# Patient Record
Sex: Female | Born: 1965 | Race: Black or African American | Hispanic: No
Health system: Midwestern US, Community
[De-identification: ages and names within clinical notes are randomized; demographics above are authoritative.]

## PROBLEM LIST (undated history)

## (undated) DIAGNOSIS — I10 Essential (primary) hypertension: Secondary | ICD-10-CM

## (undated) DIAGNOSIS — E119 Type 2 diabetes mellitus without complications: Secondary | ICD-10-CM

## (undated) HISTORY — PX: KNEE SURGERY: SHX244

---

## 2016-09-18 NOTE — Nursing Note (Signed)
Nursing Discharge Summary - Text       Nursing Discharge Summary Entered On:  09/18/2016 17:04 EST    Performed On:  09/18/2016 17:03 EST by Harlow Ohms, RN, Raphael Gibney               DC Information   Discharge To, Anticipated :   Home independently   Mode of Discharge :   Ambulatory   Transportation :   Private vehicle   [place], [place] - 09/18/2016 17:03 EST   Education   Responsible Learner(s) :   No Data Available     Barriers To Learning :   None evident   Teaching Method :   Explanation, Printed materials, Teach-back   MOOBERRY, RN, Raphael Gibney - 09/18/2016 17:03 EST   Medication Education Adult Grid   Med Dosage, Route, Scheduling :   TEFL teacher understanding   Med Generic/Brand Name, Purpose, Action :   Verbalizes understanding   Medication Precautions :   IT sales professional, Medication :   Verbalizes understanding   MOOBERRY, RN, Raphael Gibney - 09/18/2016 17:03 EST

## 2016-09-18 NOTE — Nursing Note (Signed)
Orthostatics - Text       Orthostatics Entered On:  09/18/2016 15:15 EST    Performed On:  09/18/2016 15:11 EST by Mady Haagensen               Orthostatics   Systolic/Diastolic  Supine BP :   169 mmHg (HI)    Systolic/Diastolic  Supine BP :   92 mmHg (HI)    Pulse Supine :   69 bpm   Systolic/Diastolic  Standing BP :   153 mmHg (HI)    Systolic/Diastolic  Standing BP :   109 mmHg (>HHI)    Pulse Standing :   93 bpm   Comments :   patient states feeling light headed and dizzy while standing.    Mady Haagensen - 09/18/2016 15:11 EST

## 2016-09-19 NOTE — Nursing Note (Signed)
Orthostatics - Text       Orthostatics Entered On:  09/19/2016 14:27 EST    Performed On:  09/19/2016 14:21 EST by ROUSSEAU,  CHRISTA B               Orthostatics   Systolic/Diastolic  Supine BP :   132 mmHg   Systolic/Diastolic  Supine BP :   84 mmHg   Pulse Supine :   67 bpm   Systolic/Diastolic  Standing BP :   126 mmHg   Systolic/Diastolic  Standing BP :   89 mmHg   Pulse Standing :   72 bpm   Systolic/Diastolic  Sitting BP :   154 mmHg (HI)    Systolic/Diastolic  Sitting BP :   94 mmHg (HI)    Pulse Sitting :   70 bpm   ROUSSEAU,  CHRISTA B - 09/19/2016 14:21 EST

## 2016-09-19 NOTE — Nursing Note (Signed)
Nursing Discharge Summary - Text       Nursing Discharge Summary Entered On:  09/19/2016 19:43 EST    Performed On:  09/19/2016 19:42 EST by Mechele CollinELLIOTT, RN, JESSICA A               DC Information   Discharge To, Anticipated :   Home independently   Mode of Discharge :   Ambulatory   Transportation :   Other: escorted to waiting room, informed pt she needs to wit 3 hrs before we can call ride for her. pt verbalized understanding   ELLIOTT, RN, JESSICA A - 09/19/2016 19:42 EST   Education   Responsible Learner(s) :   No Data Available     Teaching Method :   Explanation, Printed materials   ELLIOTT, RN, JESSICA A - 09/19/2016 19:42 EST   Post-Hospital Education Adult Grid   Pain Management :   Verbalizes understanding   Plan of Care :   Verbalizes understanding   When to Call Health Care Provider :   [facility]   ELLIOTT, RN, JESSICA A - 09/19/2016 19:42 EST   Medication Education Adult Grid   Med Dosage, Route, Scheduling :   Verbalizes understanding   Medication Precautions :   Verbalizes understanding   ELLIOTT, RN, JESSICA A - 09/19/2016 19:42 EST

## 2016-09-29 NOTE — Case Communication (Signed)
CM Discharge Planning Assessment - Text       CM Discharge Planning Assessment Entered On:  09/29/2016 10:45 EST    Performed On:  09/29/2016 10:44 EST by Malone,  [place]   Living Environment :   No Living Environment Information Available     Affect/Behavior :   Appropriate, Calm, Cooperative   Lives With :   [place],  [place] - 09/29/2016 10:44 EST   Discharge Needs I   Previously Documented Discharge Needs :   DISCHARGE PLAN/NEEDS:  EQUIPMENT/TREATMENT NEEDS:       Previously Documented Benefits Information :   No discharge data available.     Home Caregiver Name/Relationship :   Simona Huh - ex-friend - [phone]   CM Progress Note :   09/29/16 - CDM - MSW met w/ pt bedside. She called 911 b/c people were sticking her with needles. She could feel it on her body. She denies visual hallucinations and says no one lives w/ her. She does admit to auditory hallucinations 'people talking.' Pt recently moved to [place] from [place] in the last month. She proceeded to show MSW an email she drafted which was addressed to 'bpdnews.' In summary, it explained how she needed help b/c mean people were brought to her home by a friend and they were involved in drugs and speaking through the stereo. She never sent it. Pt says she has been 'stuck with needles' for a/b 2 wks now. After review of her chart, pt came to ED over a week ago for the same complaint. She denies SI/SA/HI. She says she was diagnosed with depression a 'while ago.' She said she saw a counselor in a hospital back in [place]. MSW asked if she had seen a counselor since being in [place], and she responded, 'no, why should I?' Pt listed a former friend, Simona Huh, [place] as her e-contact and gave MSW permission to speak with him. Simona Huh reports he has noticed a difference in her for a while. He explains it that she would say people were after her, and she became nervous and scared to where she never left her house. He  said he didn't even know she moved to [place] until one day she called him w/ and [phone] number. MSW asked if there could be a possibility that people were after her in [place], and he said, 'maybe, b/c there are some mean people around here.' But he denied that people could follow her to [place]. He said he thinks pt was hospitalized for 'a while' d/t MH related issues. MSW Sbar to Dr. Hessie Knows and Dr. Hessie Knows to see pt. Medicaid listed as pt's insurance.     09/29/16 - CDM - Pt lacks insight, impaired judgement, & paranoia - No family/friends in town. No safe d/c plan. Involuntary committment. Referrals sent to [place], [place], [place] and [place].      Dondra Prader [facility] - 09/29/2016 10:44 EST   Discharge Needs II   Discharge Planning Time Spent :   20 minutes   Dondra Prader [facility] - 09/29/2016 10:44 EST

## 2016-09-29 NOTE — Case Communication (Signed)
CM Discharge Planning Assessment - Text       CM Discharge Planning Assessment Entered On:  09/29/2016 9:28 EST    Performed On:  09/29/2016 9:20 EST by Malone,  [place]   Living Environment :   No Living Environment Information Available     Affect/Behavior :   Appropriate, Calm, Cooperative   Lives With :   [place],  [place] - 09/29/2016 9:20 EST   Discharge Needs I   Previously Documented Discharge Needs :   DISCHARGE PLAN/NEEDS:No discharge data available.  EQUIPMENT/TREATMENT NEEDS:No discharge data available.     Previously Documented Benefits Information :   No discharge data available.     Home Caregiver Name/Relationship :   Simona Huh - ex-friend - [phone]   CM Progress Note :   09/29/16 - CDM - MSW met w/ pt bedside. She called 911 b/c people were sticking her with needles. She could feel it on her body. She denies visual hallucinations and says no one lives w/ her. She does admit to auditory hallucinations 'people talking.' Pt recently moved to [place] from [place] in the last month. She proceeded to show MSW an email she drafted which was addressed to 'bpdnews.' In summary, it explained how she needed help b/c mean people were brought to her home by a friend and they were involved in drugs and speaking through the stereo. She never sent it. Pt says she has been 'stuck with needles' for a/b 2 wks now. After review of her chart, pt came to ED over a week ago for the same complaint. She denies SI/SA/HI. She says she was diagnosed with depression a 'while ago.' She said she saw a counselor in a hospital back in [place]. MSW asked if she had seen a counselor since being in [place], and she responded, 'no, why should I?' Pt listed a former friend, Simona Huh, [place] as her e-contact and gave MSW permission to speak with him. Simona Huh reports he has noticed a difference in her for a while. He explains it that she would say people were after her, and she became nervous  and scared to where she never left her house. He said he didn't even know she moved to [place] until one day she called him w/ and [phone] number. MSW asked if there could be a possibility that people were after her in [place], and he said, 'maybe, b/c there are some mean people around here.' But he denied that people could follow her to [place]. He said he thinks pt was hospitalized for 'a while' d/t MH related issues. MSW Sbar to Dr. Hessie Knows and Dr. Hessie Knows to see pt. Medicaid listed as pt's insurance.      Dondra Prader [facility] - 09/29/2016 9:20 EST   Discharge Needs II   Discharge Planning Time Spent :   30 minutes   Dondra Prader [facility] - 09/29/2016 9:20 EST

## 2016-09-29 NOTE — Nursing Note (Signed)
Nursing Discharge Summary - Text       Nursing Discharge Summary Entered On:  09/29/2016 13:33 EST    Performed On:  09/29/2016 13:33 EST by Harlow OhmsMOOBERRY, RN, Raphael GibneyJOHN S               DC Information   Discharge To, Anticipated :   Psychiatric Unit   Mode of Discharge :   Ambulatory   Transportation :   Other: police transport   Kelli ChurnMOOBERRY, RN, JOHN S - 09/29/2016 13:33 EST

## 2018-05-10 LAB — COMPREHENSIVE METABOLIC PANEL
ALT: 9 U/L (ref 0–33)
AST: 13 U/L (ref 0–32)
Albumin/Globulin Ratio: 1.74 mmol/L (ref 1.00–2.00)
Albumin: 4 g/dL (ref 3.5–5.2)
Alk Phosphatase: 84 U/L (ref 35–117)
Anion Gap: 12 mmol/L (ref 2–17)
BUN: 10 mg/dL (ref 6–20)
CO2: 26 mmol/L (ref 22–29)
Calcium: 8.9 mg/dL (ref 8.6–10.0)
Chloride: 103 mmol/L (ref 98–107)
Creatinine: 0.8 mg/dL (ref 0.5–0.9)
GFR African American: >60
GFR Non-African American: >60
Globulin: 2.3 g/dL (ref 1.9–4.4)
Glucose: 140 mg/dL — ABNORMAL HIGH (ref 70–99)
Potassium: 4 mmol/L (ref 3.5–5.3)
Sodium: 141 mmol/L (ref 135–145)
Total Bilirubin: 0.5 mg/dL (ref 0.00–1.20)
Total Protein: 6.3 g/dL — ABNORMAL LOW (ref 6.4–8.3)

## 2018-05-10 LAB — URINALYSIS WITH MICROSCOPIC
Bilirubin Urine: NEGATIVE
Blood, Urine: NEGATIVE
Glucose, UA: NEGATIVE mg/dL
Ketones, Urine: NEGATIVE mg/dL
Leukocyte Esterase, Urine: NEGATIVE
MUCUS, URINE: NONE SEEN /LPF
Nitrite, Urine: NEGATIVE
Protein, UA: NEGATIVE
Specific Gravity, UA: <=1.005 (ref 1.003–1.035)
Squam Epithel, UA: NONE SEEN /LPF
Urobilinogen, Urine: 0.2 EU/dL
pH, UA: 6.5 (ref 4.5–8.0)

## 2018-05-10 LAB — URINE DRUG SCREEN
Amphetamine Screen, Urine: NEGATIVE
Barbiturate Screen, Ur: NEGATIVE
Benzodiazepine Screen, Urine: NEGATIVE
Cannabinoid Scrn, Ur: NEGATIVE
Cocaine Screen Urine: NEGATIVE
Opiate Screen, Urine: NEGATIVE
PCP Screen, Urine: NEGATIVE

## 2018-05-10 LAB — CBC
Hematocrit: 33.8 % — ABNORMAL LOW (ref 34.0–47.0)
Hemoglobin: 11 g/dL — ABNORMAL LOW (ref 11.5–15.7)
MCH: 29.9 pg (ref 27.0–34.5)
MCHC: 32.7 g/dL (ref 32.0–36.0)
MCV: 91.5 fL (ref 81.0–99.0)
MPV: 9.2 fL (ref 7.2–11.2)
Platelets: 183 10*3/uL (ref 140–440)
RBC: 3.69 x10e6/mcL (ref 3.60–5.20)
RDW: 15.5 % (ref 11.0–16.0)
WBC: 4.3 10*3/uL (ref 3.8–10.6)

## 2018-05-10 LAB — ETHANOL: Ethanol Lvl: NOT DETECTED mg/dL (ref 0.0–10.0)

## 2018-05-10 LAB — ACETAMINOPHEN LEVEL: Acetaminophen Level: 10 ug/mL (ref 10.0–30.0)

## 2018-05-10 LAB — SALICYLATE LEVEL: Salicylate Lvl: <0.3 mg/dL — ABNORMAL LOW (ref 3.0–20.0)

## 2018-05-10 NOTE — Case Communication (Signed)
CM Discharge Planning Assessment - Text       CM Discharge Planning Ongoing Assessment Entered On:  05/10/2018 18:10 EDT    Performed On:  05/10/2018 18:07 EDT by Clint BolderMelton,  [place]               Discharge Needs I   Previously Documented Discharge Needs :   DISCHARGE PLAN/NEEDS:No discharge data available.  EQUIPMENT/TREATMENT NEEDS:No discharge data available.     Previously Documented Benefits Information :   No discharge data available.     CM Progress Note :   **See previous case managment note for 05/10/18 prior to pt elopment**  05/10/18 KRM - Review of records indicates previous hx of paranoia, delusions and possible A/V hallucinations. Pt continues to deny names, numbers of supports for hospital to contact. Due to concern of pt's welfare, SWCM to file report w/ APS.      Clint BolderMelton,  [place] - 05/10/2018 18:07 EDT

## 2018-05-10 NOTE — Case Communication (Signed)
 CM Discharge Planning Assessment - Text       CM Discharge Planning Assessment Entered On:  05/10/2018 12:46 EDT    Performed On:  05/10/2018 12:27 EDT by Agapito Iha               Home Environment   Living Environment :   No Living Environment Information Available     Affect/Behavior :   Appropriate, Calm, Other: Guarded   Lives With :   Alone   Lives In :   Apartment   Living Situation :   Home independently   Agapito Iha - 05/10/2018 12:27 EDT   Home Environment   ADLs Prior to Admission :   Independent   Sensory Deficits :   None   Agapito Iha - 05/10/2018 12:27 EDT   Discharge Needs I   Previously Documented Discharge Needs :   DISCHARGE PLAN/NEEDS:No discharge data available.  EQUIPMENT/TREATMENT NEEDS:No discharge data available.     Previously Documented Benefits Information :   No discharge data available.     CM Progress Note :   05/10/18 KRM - SWCM brought in for consult for concerns of possible psych needs after pt contacted police more than once last night w/ reports of people following her, seeing blood on her ceiling. Pt is a 53 year old female who was brought in via EMS for c/o weakness in her legs. She was previously in the hospital less than 24 hours prior for concerns of a psychiatric nature, but pt was able to verbalize an understanding that what she had reported to the police was not real and stated it was a prank being played by her friends/neighbors. SWCM met with pt at bedside. Pt was calm and cooperative during consult. She was dressed appopriately, well-groomed and appeared stated age. Pt was aox3. Pt denied any thoughts of SI/HI. Pt stated that what she saw and reported to the police was not real and that it was her friends playing a prank on her. Pt stated her friends called her and told her to call the police bc someone was bleeding her bedroom and another person was holding a knife. Per pt, police did not find anything or anyone in her bedroom. When asked how her friends  could know that someone was in her bedroom, pt stated it was bc two of the guys were in my room hiding. Pt denied knowing her friend's contact number. SWCM asked on which phone her friend's contacted her and pt stated it was her cell phone. SWCM asked if the friend's number showed on caller id, and pt stated I don't know, I didn't look. Pt proceeded to look through her cell phone, and said, it's not here. SWCM discussed w/ pt possible natural and social supports. Pt stated she has three adult children who live in the [place] area and but none in the [place] area. Per pt she has lived here around 4 years. When asked if any of her children knew she was in the ED currently or last night, she said no. When asked if she wanted the hosptial to reach to anyone regarding being in the ED or if she is admitted, was there anyone whom pt wanted notified, she said no. During consult and while answering questions, pt appeared logical and verbalized understanding that what she saw/reported to the police, was not real. Pt did appear guarded, affect blunted. She denied any past hx of mental illness, including a/v hallucinations or paranoia. Pt made  eye contact w/ SWCM during consult when answering questions; however, pt appeared to focus on door when asked if there was anyone to contact. SWCM provided sbar to ED MD and will remain available for support, services as needed.      Agapito Iha - 05/10/2018 12:27 EDT   Discharge Needs II   Discharge Planning Time Spent :   30 minutes   Agapito Iha - 05/10/2018 12:27 EDT   Advance Directive   Advance Directive :   No   Agapito Iha - 05/10/2018 12:27 EDT   Discharge Planning   Discharge Arrangements :       Patient Post-Acute Information    Patient Name: Kaitlin Porter, Kaitlin Porter  MRN: [id number]  FIN: [id number]  Gender: Female  DOB: April 06, 2066  Age:  52 Years        *** No Post-Acute Placement(s) Listed ***          *** No Post-Acute Service(s) Listed ***       Agapito Iha - 05/10/2018 12:27 EDT   Discharge Planning Assessment Status   Discharge Planning Assessment Complete :   Yes   Agapito Iha - 05/10/2018 12:27 EDT

## 2019-01-04 LAB — CBC
Hematocrit: 35.8 % — ABNORMAL LOW (ref 38.0–47.0)
Hemoglobin: 11.3 g/dL — ABNORMAL LOW (ref 11.5–15.7)
MCH: 28.2 pg (ref 27.0–34.5)
MCHC: 31.6 g/dL — ABNORMAL LOW (ref 32.0–36.0)
MCV: 89.3 fL (ref 81.0–99.0)
MPV: 10.9 fL (ref 7.2–13.2)
NRBC Absolute: 0 10*3/uL
NRBC Automated: 0 %
Platelets: 202 10*3/uL (ref 140–440)
RBC: 4.01 x10e6/mcL (ref 3.60–5.20)
RDW: 14.8 % (ref 11.0–16.0)
WBC: 6.6 10*3/uL (ref 3.8–10.6)

## 2019-01-04 LAB — BASIC METABOLIC PANEL
Anion Gap: 16 mmol/L (ref 2–17)
BUN: 14 mg/dL (ref 6–20)
CO2: 22 mmol/L (ref 22–29)
Calcium: 9.3 mg/dL (ref 8.6–10.0)
Chloride: 99 mmol/L (ref 98–107)
Creatinine: 0.8 mg/dL (ref 0.5–0.9)
GFR African American: 98 mL/min/{1.73_m2} (ref >=90)
GFR Non-African American: 85 mL/min/{1.73_m2} — ABNORMAL LOW (ref >=90)
Glucose: 94 mg/dL (ref 70–99)
OSMOLALITY CALCULATED: 274 mOsm/kg (ref 270–287)
Potassium: 4 mmol/L (ref 3.5–5.3)
Sodium: 137 mmol/L (ref 135–145)

## 2019-01-04 LAB — HEPATIC FUNCTION PANEL
ALT: 6 U/L (ref 0–33)
AST: 17 U/L (ref 0–32)
Albumin: 4.6 g/dL (ref 3.5–5.2)
Alk Phosphatase: 73 U/L (ref 35–117)
Bilirubin, Direct: <0.2 mg/dL (ref 0.00–0.30)
Total Bilirubin: 0.7 mg/dL (ref 0.00–1.20)
Total Protein: 7.6 g/dL (ref 6.4–8.3)

## 2019-01-04 LAB — LIPASE: Lipase: 6 U/L — ABNORMAL LOW (ref 13–60)

## 2019-01-04 NOTE — ED Provider Notes (Signed)
Abdominal Pain *ED        Patient:   Kaitlin Porter, Kaitlin Porter             MRN: [id number]            FIN: [id number]               Age:   53 years     Sex:  Female     DOB:  [birth year]   Associated Diagnoses:   Abdominal gas pain; Constipation   Author:   Charlyne Mom      Basic Information   Time seen: Provider Seen (ST)   ED Provider/Time:    Charlyne Mom / 01/04/2019 18:19  .   Additional information: Chief Complaint from Nursing Triage Note   Chief Complaint  Chief Complaint: Pt c/o lower abd pain radiating to back x2 weeks. pt also reports not having a BM for "weeks". pt was seen at [facility] today for same. (01/04/19 18:18:00).      History of Present Illness   Kaitlin Porter is a 53 year old African American female who presents via EMS with abdominal pain.  Pt. was seen at [facility] [place] earlier today for the same complaint.  Pt received a full workup there with bloodwork and a CT scan and was determined to have no bowel obstruction but was diagnosed with constipation.  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  Pt presents c/o sharp pains in the abdomen since taking the Miralax that she was prescribed earlier today.  States she has not had a bowel movement. States the cramping in her abdomen is very painful.       Review of Systems   Constitutional symptoms:  Negative except as documented in HPI.   Skin symptoms:  Negative except as documented in HPI.   ENMT symptoms:  Negative except as documented in HPI.   Respiratory symptoms:  Negative except as documented in HPI.   Cardiovascular symptoms:  Negative except as documented in HPI.   Gastrointestinal symptoms:  Abdominal pain, diffuse, cramping, constipation.    Genitourinary symptoms:  Negative except as documented in HPI.   Neurologic symptoms:  Negative  except as documented in HPI.             Additional review of systems information: All other systems reviewed and otherwise negative.      Health Status   Allergies:    Allergic Reactions (All)  Mild  Lipitor- No reactions were documented.  Canceled/Inactive Reactions (All)  No Known Allergies.   Medications:  (Selected)   Inpatient Medications  Ordered  simethicone: 40 mg, 0.6 mL, Oral, Once  Prescriptions  Prescribed  Bentyl 20 mg oral tablet: 20 mg, 1 tabs, Oral, QID, for 5 days, PRN: mild pain (1-3), 20 tabs, 0 Refill(s)  MiraLax oral powder for reconstitution: 17 g, Oral, Daily, for 14 days, 238 g, 0 Refill(s)  PriLOSEC 20 mg oral delayed release capsule: 20 mg, 1 caps, Oral, Daily, 30 caps, 0 Refill(s)  meloxicam 7.5 mg oral tablet: 7.5 mg, 1 tabs, Oral, Daily, pain, PRN: other (see comment), 30 tabs, 0 Refill(s)  ondansetron 4 mg oral tablet: 4 mg, 1 tabs, Oral, q6hr, for 3 days, PRN: as needed for nausea/vomiting, 20 tabs,  0 Refill(s)  Documented Medications  Documented  HYDROcodone-acetaminophen 5-325 EmergiScript: 0 Refill(s)  Tylenol Extra Strength 500 mg oral tablet: mg, tabs, Oral, q6hr, 0 Refill(s)  atenolol 100 mg oral tablet: 100 mg, 1 tabs, Oral, Daily, 30 tabs, 0 Refill(s)  cyclobenzaprine 10 mg oral tablet: 10 mg, 1 tabs, Oral, TID, PRN, 30 tabs, 0 Refill(s)  fluticasone 50 mcg/inh nasal spray: 1 sprays, Nasal, BID, 16 g, 0 Refill(s)  levothyroxine 75 mcg (0.075 mg) oral tablet: 75 mcg, 1 tabs, Oral, Daily, 30 tabs, 0 Refill(s)  lisinopril 40 mg oral tablet: 40 mg, 1 tabs, Oral, Daily, 30 tabs, 0 Refill(s)  meloxicam 7.5 mg oral tablet: 7.5 mg, 1 tabs, Oral, Daily, 30 tabs, 0 Refill(s)  oxybutynin 5 mg/24 hours oral tablet, extended release: 5 mg, 1 tabs, Oral, Daily, 30 tabs, 0 Refill(s)  simvastatin 20 mg oral tablet: 20 mg, 1 tabs, Oral, Once a Day (at bedtime), 0 Refill(s).      Past Medical/ Family/ Social History   Problem list:    Active Problems (3)  HTN   Hypercholesteremia   Hypothyroid    .      Physical Examination               Vital Signs   Vital Signs   01/04/2019 18:24 EDT Respiratory Rate 16 br/min   01/04/2019 18:18 EDT Systolic Blood Pressure 139 mmHg    Diastolic Blood Pressure 92 mmHg  HI    Temperature Oral 36.2 degC    Heart Rate Monitored 77 bpm    Respiratory Rate 17 br/min    SpO2 100 %   01/04/2019 12:05 EDT Temperature Oral 36.5 degC   01/04/2019 11:40 EDT Systolic Blood Pressure 175 mmHg  HI    Diastolic Blood Pressure 95 mmHg  HI    Heart Rate Monitored 80 bpm    Respiratory Rate 16 br/min    SpO2 100 %   01/04/2019 11:12 EDT Systolic Blood Pressure 161 mmHg  HI    Diastolic Blood Pressure 96 mmHg  HI    Heart Rate Monitored 88 bpm    Respiratory Rate 16 br/min    SpO2 99 %   01/04/2019 10:25 EDT Systolic Blood Pressure 176 mmHg  HI    Diastolic Blood Pressure 109 mmHg  >HHI    Temperature Oral 36.9 degC    Heart Rate Monitored 95 bpm    Respiratory Rate 16 br/min    SpO2 99 %    Measurements   01/04/2019 18:21 EDT Body Mass Index est meas 35.96 kg/m2    Body Mass Index Measured 35.96 kg/m2   01/04/2019 18:18 EDT Height/Length Measured 167.6 cm    Weight Dosing 101 kg   01/04/2019 10:30 EDT Body Mass Index est meas 31.33 kg/m2    Body Mass Index Measured 31.33 kg/m2   01/04/2019 10:25 EDT Height/Length Measured 167.6 cm    Weight Dosing 88 kg    General:  Alert, no acute distress.    Skin:  Warm.   Head:  Normocephalic.   Eye:  Pupils are equal, round and reactive to light.   Ears, nose, mouth and throat:  Oral mucosa moist.   Cardiovascular:  Regular rate and rhythm.   Respiratory:  Lungs are clear to auscultation.   Gastrointestinal:  Soft, Non distended, Tenderness: Moderate, generalized, Guarding: Negative, Rebound: Negative, Bowel sounds: Hyperactive.    Back:  Nontender, Normal range of motion.    Musculoskeletal:  Normal ROM.   Neurological:  Alert and  oriented to person, place, time, and situation, No focal neurological deficit observed.    Psychiatric:  Cooperative.      Medical Decision  Making   Differential Diagnosis:  Abdominal pain, constipation.    Documents reviewed:  Emergency department nurses' notes, flowsheet, emergency department records, prior records, vital signs, [facility].  Please refer to those for pt's workup including CT scan..    Notes:  pt seemed angry when I asked her what else I could do for her after [facility].  She asked me if I could do a CT scan of her abdomen but she just received one earlier today approximately 6 hours ago.   I explained this to her and told her that her bowel sounds are hyperactive likely giving her gas pains from the Miralax and that I can offer her something to calm down the gas pains but there is nothing else diagnostic that they did not do earlier.  She agreed to try some simethicone..      Reexamination/ Reevaluation   Time: 01/04/2019 19:40:00 .   Course: improving.   Pain status: decreased.      Impression and Plan   Diagnosis   Abdominal gas pain (ICD10-CM R14.1, Discharge, Medical)   Constipation (ICD10-CM K59.00, Discharge, Medical)   Plan   Condition: Stable.    Disposition: Discharged: to home.    Patient was given the following educational materials: Intestinal Gas and Gas Pains, Adult ([id number]).    Follow up with: Be sure to follow up as directed from your [facility] visit earlier with both your primary care physician and Neurosurgery. Within 1 week You can take some Gas-x over the counter for gas pains.  Follow up with Dr. Christoper FabianMcNulty if your constipation does not resolve over the weekend with the Miralax. ; SAMUEL MCNULTY-MD, View Only Providers - No Access Within 1 week.    Counseled: Patient.    Signature Line     Electronically Signed on 01/04/2019 07:40 PM EDT   ________________________________________________   Charlyne MomKING-MD,  Katheline Brendlinger ELAINE               Modified by: Charlyne MomKING-MD,  Tanaisha Pittman ELAINE on 01/04/2019 07:01 PM EDT      Modified by: Charlyne MomKING-MD,  Irem Stoneham ELAINE on 01/04/2019 07:40  PM EDT

## 2019-01-04 NOTE — Nursing Note (Signed)
Medication Administration Follow Up-Text       Medication Administration Follow Up Entered On:  01/04/2019 11:38 EDT    Performed On:  01/04/2019 11:38 EDT by Cliffton Asters, RN, [place]      Intervention Information:     ketorolac  Performed by Cliffton Asters, RN, [place] on 01/04/2019 11:03:00 EDT       ketorolac,15mg   IV Push,Antecubital, Right       Med Response   ED Medication Response :   No adverse reaction, Symptoms improved   Numeric Rating Pain Scale :   7   [place] Opioid Induced Sedation Scale :   1 = Awake and alert   White, RN, [place] - 01/04/2019 11:38 EDT

## 2019-01-04 NOTE — ED Notes (Signed)
 ED Triage Note       ED Triage Adult Entered On:  01/04/2019 18:21 EDT    Performed On:  01/04/2019 18:18 EDT by Dannial, RN, Jaclynne A               Triage   Chief Complaint :   Pt c/o lower abd pain radiating to back x2 weeks. pt also reports not having a BM for weeks. pt was seen at [facility] today for same.    Numeric Rating Pain Scale :   8   [place] Mode of Arrival :   Ambulance   Infectious Disease Documentation :   Document assessment   Temperature Oral :   36.2 degC(Converted to: 97.2 degF)    Heart Rate Monitored :   77 bpm   Respiratory Rate :   17 br/min   Systolic Blood Pressure :   139 mmHg   Diastolic Blood Pressure :   92 mmHg (HI)    SpO2 :   100 %   Oxygen Therapy :   Room air   Patient presentation :   None of the above   Chief Complaint or Presentation suggest infection :   No   Dosing Weight Obtained By :   Patient stated   Weight Dosing :   101 kg(Converted to: 222 lb 11 oz)    Height :   167.6 cm(Converted to: 5 ft 6 in)    Body Mass Index Dosing :   36 kg/m2   Dannial, RN, [place] A - 01/04/2019 18:18 EDT   DCP GENERIC CODE   Tracking Acuity :   3   Tracking Group :   ED [address] Tracking Group   [place], RN, [place] A - 01/04/2019 18:18 EDT   ED General Section :   Document assessment   Pregnancy Status :   Patient denies   ED Allergies Section :   Document assessment   ED Reason for Visit Section :   Document assessment   Dannial, RN, [place]ch LABOR - 01/04/2019 18:18 EDT   ID Risk Screen Symptoms   Recent Travel History :   No recent travel   Close Contact with COVID-19  ID :   No   Have you been tested for COVID-19 ID :   No   TB Symptom Screen :   No symptoms   C. diff Symptom/History ID :   Neither of the above   Dannial, RN, Jaclynne A - 01/04/2019 18:18 EDT   Allergies   (As Of: 01/04/2019 18:21:10 EDT)   Allergies (Active)   Lipitor  Estimated Onset Date:   Unspecified ; Created ByBETHA FLOR, RN, LARA J; Reaction Status:   Active ; Category:   Drug ; Substance:   Lipitor ; Type:   Allergy ; Severity:    Mild ; Updated By:   FLOR RN, KARILYN PARAS; Reviewed Date:   01/04/2019 18:19 EDT        Psycho-Social   Last 3 mo, thoughts killing self/others :   Patient denies   Dannial, Programme researcher, broadcasting/film/video - 01/04/2019 18:18 EDT   ED Reason for Visit   (As Of: 01/04/2019 18:21:10 EDT)   Problems(Active)    HTN (SNOMED CT  :AZEGxwEmLzUcjRiawKgAAg )  Name of Problem:   HTN ; Recorder:   CABE, RN, ADAM Z; Confirmation:   Confirmed ; Classification:   Medical ; Code:   AZEGxwEmLzUcjRiawKgAAg ; Contributor System:   PowerChart ; Last Updated:   09/18/2016  13:23 EST ; Life Cycle Date:   09/18/2016 ; Life Cycle Status:   Active ; Vocabulary:   SNOMED CT        Hypercholesteremia (IMO  :53324 )  Name of Problem:   Hypercholesteremia ; Recorder:   CABE, RN, ADAM Z; Confirmation:   Confirmed ; Classification:   Medical ; Code:   53324 ; Contributor System:   PowerChart ; Last Updated:   09/18/2016 13:23 EST ; Life Cycle Date:   09/18/2016 ; Life Cycle Status:   Active ; Vocabulary:   IMO        Hypothyroid (IMO  :B9796911 )  Name of Problem:   Hypothyroid ; Recorder:   MALONE, RN, JENNIFER L; Confirmation:   Confirmed ; Classification:   Medical ; Code:   [id number] ; Contributor System:   PowerChart ; Last Updated:   09/19/2016 14:07 EST ; Life Cycle Date:   09/19/2016 ; Life Cycle Status:   Active ; Vocabulary:   IMO          Diagnoses(Active)    Abdominal pain  Date:   01/04/2019 ; Diagnosis Type:   Reason For Visit ; Confirmation:   Complaint of ; Clinical Dx:   Abdominal pain ; Classification:   Medical ; Clinical Service:   Non-Specified ; Code:   PNED ; Probability:   0 ; Diagnosis Code:   [id number]

## 2019-01-04 NOTE — ED Notes (Signed)
 ED Triage Note       ED Triage Adult Entered On:  01/04/2019 10:30 EDT    Performed On:  01/04/2019 10:25 EDT by Teresa, RN, Crystal               Triage   Chief Complaint :   i feel bloated i took colace yesterday without relief last bowel movement over a week ago   Numeric Rating Pain Scale :   7   [place] Mode of Arrival :   Private vehicle   Infectious Disease Documentation :   Document assessment   Temperature Oral :   36.9 degC(Converted to: 98.4 degF)    Heart Rate Monitored :   95 bpm   Respiratory Rate :   16 br/min   SpO2 :   99 %   Oxygen Therapy :   Room air   Patient presentation :   None of the above   Chief Complaint or Presentation suggest infection :   No   Dosing Weight Obtained By :   Measured   Weight Dosing :   88 kg(Converted to: 194 lb 0 oz)    Height :   167.6 cm(Converted to: 5 ft 6 in)    Body Mass Index Dosing :   31 kg/m2   White, RN, Crystal - 01/04/2019 10:25 EDT   DCP GENERIC CODE   Tracking Acuity :   3   Tracking Group :   ED [employer], RN, Crystal - 01/04/2019 10:25 EDT   ED General Section :   Document assessment   Pregnancy Status :   Patient denies   ED Allergies Section :   Document assessment   ED Reason for Visit Section :   Document assessment   ED Home Meds Section :   Document assessment   ED Quick Assessment :   Patient appears awake, alert, oriented to baseline. Skin warm and dry. Moves all extremities. Respiration even and unlabored. Appears in no apparent distress.   White, RN, Crystal - 01/04/2019 10:25 EDT   ID Risk Screen Symptoms   Recent Travel History :   No recent travel   Close Contact with COVID-19  ID :   No   Have you been tested for COVID-19 ID :   No   TB Symptom Screen :   No symptoms   C. diff Symptom/History ID :   Neither of the above   White, RN, Crystal - 01/04/2019 10:25 EDT   Allergies   (As Of: 01/04/2019 10:30:57 EDT)   Allergies (Active)   Lipitor  Estimated Onset Date:   Unspecified ; Created ByBETHA FLOR, RN, LARA J; Reaction Status:    Active ; Category:   Drug ; Substance:   Lipitor ; Type:   Allergy ; Severity:   Mild ; Updated By:   FLOR RN, KARILYN PARAS; Reviewed Date:   01/04/2019 10:28 EDT        Psycho-Social   Last 3 mo, thoughts killing self/others :   Patient denies   Injuries/Abuse/Neglect in Household :   Denies   Feels Unsafe at Home :   No   White, RN, Crystal - 01/04/2019 10:25 EDT   ED Home Med List   Medication List   (As Of: 01/04/2019 10:30:57 EDT)   Prescription/Discharge Order    omeprazole  :   omeprazole ; Status:   Prescribed ; Ordered As Mnemonic:   PriLOSEC 20 mg oral  delayed release capsule ; Simple Display Line:   20 mg, 1 caps, Oral, Daily, 30 caps, 0 Refill(s) ; Ordering Provider:   PRICE-MD,  KEVIN CHARLES; Catalog Code:   omeprazole ; Order Dt/Tm:   02/19/2018 23:32:47 EDT          meloxicam  :   meloxicam ; Status:   Prescribed ; Ordered As Mnemonic:   meloxicam 7.5 mg oral tablet ; Simple Display Line:   7.5 mg, 1 tabs, Oral, Daily, pain, PRN: other (see comment), 30 tabs, 0 Refill(s) ; Ordering Provider:   CANDIDA ALM NED; Catalog Code:   meloxicam ; Order Dt/Tm:   09/18/2016 16:55:32 EST            Home Meds    acetaminophen  :   acetaminophen ; Status:   Documented ; Ordered As Mnemonic:   Tylenol Extra Strength 500 mg oral tablet ; Simple Display Line:   mg, tabs, Oral, q6hr, 0 Refill(s) ; Catalog Code:   acetaminophen ; Order Dt/Tm:   01/04/2019 10:30:40 EDT          cyclobenzaprine  :   cyclobenzaprine ; Status:   Documented ; Ordered As Mnemonic:   cyclobenzaprine 10 mg oral tablet ; Simple Display Line:   10 mg, 1 tabs, Oral, TID, PRN, 30 tabs, 0 Refill(s) ; Catalog Code:   cyclobenzaprine ; Order Dt/Tm:   09/29/2016 04:32:44 EST ; Comment:    THIS MEDICATION IS ASSOCIATED   WITH   AN INCREASED RISK OF FALLS.          atenolol  :   atenolol ; Status:   Documented ; Ordered As Mnemonic:   atenolol 100 mg oral tablet ; Simple Display Line:   100 mg, 1 tabs, Oral, Daily, 30 tabs, 0 Refill(s) ; Catalog Code:    atenolol ; Order Dt/Tm:   09/19/2016 14:08:51 EST          fluticasone nasal  :   fluticasone nasal ; Status:   Documented ; Ordered As Mnemonic:   fluticasone 50 mcg/inh nasal spray ; Simple Display Line:   1 sprays, Nasal, BID, 16 g, 0 Refill(s) ; Catalog Code:   fluticasone nasal ; Order Dt/Tm:   09/19/2016 14:08:51 EST          HYDROcodone-acetaminophen  :   HYDROcodone-acetaminophen ; Status:   Documented ; Ordered As Mnemonic:   HYDROcodone-acetaminophen 5-325 EmergiScript ; Simple Display Line:   0 Refill(s) ; Catalog Code:   HYDROcodone-acetaminophen ; Order Dt/Tm:   09/19/2016 14:08:51 EST          levothyroxine  :   levothyroxine ; Status:   Documented ; Ordered As Mnemonic:   levothyroxine 75 mcg (0.075 mg) oral tablet ; Simple Display Line:   75 mcg, 1 tabs, Oral, Daily, 30 tabs, 0 Refill(s) ; Catalog Code:   levothyroxine ; Order Dt/Tm:   09/19/2016 14:08:51 EST          lisinopril  :   lisinopril ; Status:   Documented ; Ordered As Mnemonic:   lisinopril 40 mg oral tablet ; Simple Display Line:   40 mg, 1 tabs, Oral, Daily, 30 tabs, 0 Refill(s) ; Catalog Code:   lisinopril ; Order Dt/Tm:   09/19/2016 14:08:51 EST          meloxicam  :   meloxicam ; Status:   Documented ; Ordered As Mnemonic:   meloxicam 7.5 mg oral tablet ; Simple Display Line:   7.5 mg,  1 tabs, Oral, Daily, 30 tabs, 0 Refill(s) ; Catalog Code:   meloxicam ; Order Dt/Tm:   09/19/2016 14:08:51 EST          oxybutynin  :   oxybutynin ; Status:   Documented ; Ordered As Mnemonic:   oxybutynin 5 mg/24 hours oral tablet, extended release ; Simple Display Line:   5 mg, 1 tabs, Oral, Daily, 30 tabs, 0 Refill(s) ; Catalog Code:   oxybutynin ; Order Dt/Tm:   09/19/2016 14:08:51 EST          simvastatin  :   simvastatin ; Status:   Documented ; Ordered As Mnemonic:   simvastatin 20 mg oral tablet ; Simple Display Line:   20 mg, 1 tabs, Oral, Once a Day (at bedtime), 0 Refill(s) ; Catalog Code:   simvastatin ; Order Dt/Tm:   09/19/2016 14:08:51  EST            ED Reason for Visit   (As Of: 01/04/2019 10:30:57 EDT)   Problems(Active)    HTN (SNOMED CT  :AZEGxwEmLzUcjRiawKgAAg )  Name of Problem:   HTN ; Recorder:   CABE, RN, ADAM Z; Confirmation:   Confirmed ; Classification:   Medical ; Code:   AZEGxwEmLzUcjRiawKgAAg ; Contributor System:   PowerChart ; Last Updated:   09/18/2016 13:23 EST ; Life Cycle Date:   09/18/2016 ; Life Cycle Status:   Active ; Vocabulary:   SNOMED CT        Hypercholesteremia (IMO  :53324 )  Name of Problem:   Hypercholesteremia ; Recorder:   CABE, RN, ADAM Z; Confirmation:   Confirmed ; Classification:   Medical ; Code:   53324 ; Contributor System:   PowerChart ; Last Updated:   09/18/2016 13:23 EST ; Life Cycle Date:   09/18/2016 ; Life Cycle Status:   Active ; Vocabulary:   IMO        Hypothyroid (IMO  :B9796911 )  Name of Problem:   Hypothyroid ; Recorder:   MALONE, RN, JENNIFER L; Confirmation:   Confirmed ; Classification:   Medical ; Code:   [id number] ; Contributor System:   PowerChart ; Last Updated:   09/19/2016 14:07 EST ; Life Cycle Date:   09/19/2016 ; Life Cycle Status:   Active ; Vocabulary:   IMO          Diagnoses(Active)    CN - Constipation  Date:   01/04/2019 ; Diagnosis Type:   Reason For Visit ; Confirmation:   Complaint of ; Clinical Dx:   CN - Constipation ; Classification:   Medical ; Clinical Service:   Emergency medicine ; Code:   PNED ; Probability:   0 ; Diagnosis Code:   [id number]

## 2019-01-04 NOTE — ED Notes (Signed)
ED Triage Note       ED Secondary Triage Entered On:  01/04/2019 10:36 EDT    Performed On:  01/04/2019 10:35 EDT by Cliffton Asters, RN, Crystal               General Information   Barriers to Learning :   None evident   ED Home Meds Section :   Document assessment   [facility] ED Fall Risk Section :   Document assessment   ED Advance Directives Section :   Document assessment   White, RN, Crystal - 01/04/2019 10:35 EDT   (As Of: 01/04/2019 10:36:05 EDT)   Problems(Active)    HTN (SNOMED CT  :AZEGxwEmLzUcjRiawKgAAg )  Name of Problem:   HTN ; Recorder:   CABE, RN, ADAM Z; Confirmation:   Confirmed ; Classification:   Medical ; Code:   AZEGxwEmLzUcjRiawKgAAg ; Contributor System:   Dietitian ; Last Updated:   09/18/2016 13:23 EST ; Life Cycle Date:   09/18/2016 ; Life Cycle Status:   Active ; Vocabulary:   SNOMED CT        Hypercholesteremia (IMO  :93112 )  Name of Problem:   Hypercholesteremia ; Recorder:   CABE, RN, ADAM Z; Confirmation:   Confirmed ; Classification:   Medical ; Code:   16244 ; Contributor System:   PowerChart ; Last Updated:   09/18/2016 13:23 EST ; Life Cycle Date:   09/18/2016 ; Life Cycle Status:   Active ; Vocabulary:   IMO        Hypothyroid (IMO  :[id number] )  Name of Problem:   Hypothyroid ; Recorder:   MALONE, RN, JENNIFER L; Confirmation:   Confirmed ; Classification:   Medical ; Code:   [id number] ; Contributor System:   Dietitian ; Last Updated:   09/19/2016 14:07 EST ; Life Cycle Date:   09/19/2016 ; Life Cycle Status:   Active ; Vocabulary:   IMO          Diagnoses(Active)    CN - Constipation  Date:   01/04/2019 ; Diagnosis Type:   Reason For Visit ; Confirmation:   Complaint of ; Clinical Dx:   CN - Constipation ; Classification:   Medical ; Clinical Service:   Emergency medicine ; Code:   PNED ; Probability:   0 ; Diagnosis Code:   [id number]             -    Procedure History   (As Of: 01/04/2019 10:36:05 EDT)     Anesthesia Minutes:   0 ; Procedure Name:   VP shunt ; Procedure  Minutes:   0            Anesthesia Minutes:   0 ; Procedure Name:   Cholecystectomy ; Procedure Minutes:   0            [facility] Fall Risk Assessment Tool   Hx of falling last 3 months ED Fall :   No   Patient confused or disoriented ED Fall :   No   Patient intoxicated or sedated ED Fall :   No   Patient impaired gait ED Fall :   No   Use a mobility assistance device ED Fall :   No   Patient altered elimination ED Fall :   No   [facility] ED Fall Score :   0    White, RN, Crystal - 01/04/2019 10:35 EDT   ED Advance Directive   Advance Directive :   No  White, RN, Crystal - 01/04/2019 10:35 EDT

## 2019-01-04 NOTE — Nursing Note (Signed)
Medication Administration Follow Up-Text       Medication Administration Follow Up Entered On:  01/04/2019 11:38 EDT    Performed On:  01/04/2019 11:38 EDT by Cliffton Asters, RN, Crystal      Intervention Information:     ondansetron  Performed by Cliffton Asters, RN, Crystal on 01/04/2019 11:03:00 EDT       ondansetron,4mg   IV Push,Antecubital, Right       Med Response   ED Medication Response :   No adverse reaction, Symptoms improved   Numeric Rating Pain Scale :   7   [place] Opioid Induced Sedation Scale :   1 = Awake and alert   White, RN, Crystal - 01/04/2019 11:38 EDT

## 2019-01-04 NOTE — ED Provider Notes (Signed)
Abdominal Pain *ED        Patient:   Kaitlin Porter, Kaitlin Porter             MRN: [id number]            FIN: [id number]               Age:   53 years     Sex:  Female     DOB:  Oct 31, 1965   Associated Diagnoses:   Abdominal pain; Constipation   Author:   Cathie Hoops A      Basic Information   Time seen: Provider Seen (ST)   ED Provider/Time:    Irlene Crudup-MD,  Burhanuddin Kohlmann A / 01/04/2019 10:22  .   Additional information: Chief Complaint from Nursing Triage Note   Chief Complaint  Chief Complaint: "i feel bloated" i took colace yesterday without relief last bowel movement over a week ago (01/04/19 10:25:00).      History of Present Illness   Patient complains of 3 days of right-sided abdominal pain described as pain and bloating.  She notes that she has a "hard knot" in the right abdomen.  She has been constipated has not had a bowel movement approximately 1 week.  She typically only has a bowel movement about twice per week.  She denies any recent blood in the stool.  She has had some mild rectal discomfort.  She feels nauseous but has not had any vomiting.  No history of bowel obstruction.  She is status post cholecystectomy remotely.  She has a remote history of cervical spine surgery years ago but no noted lumbar surgical history.  She has some paresthesias in the anterior bilateral thighs but denies any saddle anesthesia no urinary incontinence or fecal incontinence.  Denies fevers or chills.  She feels little short of breath sometimes but has not had a cough or chest pain.  Moderate severity.  No alleviating factors at home.  No radiation.        Review of Systems             Additional review of systems information: All other systems reviewed and otherwise negative.      Health Status   Allergies:    Allergic Reactions (All)  Mild  Lipitor- No reactions were documented.  Canceled/Inactive Reactions (All)  No Known Allergies.   Medications:  (Selected)   Inpatient Medications  Ordered  Isovue-300: 100 mL, IV Contrast, Once  Toradol:  15 mg, 1 mL, IV Push, Once  Zofran: 4 mg, 2 mL, IV Push, Once  Prescriptions  Prescribed  PriLOSEC 20 mg oral delayed release capsule: 20 mg, 1 caps, Oral, Daily, 30 caps, 0 Refill(s)  meloxicam 7.5 mg oral tablet: 7.5 mg, 1 tabs, Oral, Daily, pain, PRN: other (see comment), 30 tabs, 0 Refill(s)  Documented Medications  Documented  HYDROcodone-acetaminophen 5-325 EmergiScript: 0 Refill(s)  Tylenol Extra Strength 500 mg oral tablet: mg, tabs, Oral, q6hr, 0 Refill(s)  atenolol 100 mg oral tablet: 100 mg, 1 tabs, Oral, Daily, 30 tabs, 0 Refill(s)  cyclobenzaprine 10 mg oral tablet: 10 mg, 1 tabs, Oral, TID, PRN, 30 tabs, 0 Refill(s)  fluticasone 50 mcg/inh nasal spray: 1 sprays, Nasal, BID, 16 g, 0 Refill(s)  levothyroxine 75 mcg (0.075 mg) oral tablet: 75 mcg, 1 tabs, Oral, Daily, 30 tabs, 0 Refill(s)  lisinopril 40 mg oral tablet: 40 mg, 1 tabs, Oral, Daily, 30 tabs, 0 Refill(s)  meloxicam 7.5 mg oral tablet: 7.5 mg, 1 tabs, Oral, Daily, 30  tabs, 0 Refill(s)  oxybutynin 5 mg/24 hours oral tablet, extended release: 5 mg, 1 tabs, Oral, Daily, 30 tabs, 0 Refill(s)  simvastatin 20 mg oral tablet: 20 mg, 1 tabs, Oral, Once a Day (at bedtime), 0 Refill(s).      Past Medical/ Family/ Social History   Medical history: Reviewed as documented in chart.   Surgical history: Reviewed as documented in chart.   Family history: Not significant.   Social history: Reviewed as documented in chart.   Problem list:    Active Problems (3)  HTN   Hypercholesteremia   Hypothyroid   .      Physical Examination               Vital Signs   Vital Signs   6/0/1093 23:55 EDT Systolic Blood Pressure 732 mmHg  HI    Diastolic Blood Pressure 202 mmHg  >HHI    Temperature Oral 36.9 degC    Heart Rate Monitored 95 bpm    Respiratory Rate 16 br/min    SpO2 99 %    Measurements   01/04/2019 10:30 EDT Body Mass Index est meas 31.33 kg/m2    Body Mass Index Measured 31.33 kg/m2   01/04/2019 10:25 EDT Height/Length Measured 167.6 cm    Weight Dosing 88 kg     Basic Oxygen Information   01/04/2019 10:25 EDT SpO2 99 %    Oxygen Therapy Room air    General:  Alert, no acute distress.    Skin:  Warm, dry, pink.    Head:  Normocephalic, atraumatic.    Eye:  Extraocular movements are intact, normal conjunctiva.    Cardiovascular:  Regular rate and rhythm, No murmur, Normal peripheral perfusion.    Respiratory:  Lungs are clear to auscultation, respirations are non-labored, breath sounds are equal.    Gastrointestinal:  Soft, Non distended, Normal bowel sounds, Patient has mild diffuse abdominal tenderness more prominent in the right upper right lower quadrant.  There is a firm area measuring about 5 x 3 cm in the right lower quadrant and subcutaneous tissue.  Is not fluctuant..    Musculoskeletal:  Normal ROM, normal strength.    Neurological:  Alert and oriented to person, place, time, and situation, No focal neurological deficit observed.    Psychiatric:  Cooperative, appropriate mood & affect.       Medical Decision Making   Documents reviewed:  Emergency department nurses' notes, flowsheet, vital signs.    Results review:  Lab results : Lab View   01/04/2019 11:21 EDT Estimated Creatinine Clearance 91.89 mL/min   01/04/2019 10:57 EDT WBC 6.6 x10e3/mcL    RBC 4.01 x10e6/mcL    Hgb 11.3 g/dL  LOW    HCT 35.8 %  LOW    MCV 89.3 fL    MCH 28.2 pg    MCHC 31.6 g/dL  LOW    RDW 14.8 %    Platelet 202 x10e3/mcL    MPV 10.9 fL    NRBC Absolute Auto 0.00 x10e3/mcL  NA    NRBC Percent Auto 0.0 %  NA    Sodium Lvl 137 mmol/L    Potassium Lvl 4.0 mmol/L    Chloride 99 mmol/L    CO2 22 mmol/L    Glucose Random 94 mg/dL    BUN 14 mg/dL    Creatinine Lvl 0.8 mg/dL    AGAP 16 mmol/L    Osmolality Calc 274 mOsm/kg    Calcium Lvl 9.3 mg/dL    Protein Total 7.6  g/dL    Albumin Lvl 4.6 g/dL    Alk Phos 73 unit/L    AST 17 unit/L    ALT 6 unit/L    eGFR AA 98 mL/min/1.46m???    eGFR Non-AA 85 mL/min/1.719m??  LOW    Bili Total 0.70 mg/dL    Bili Direct <0.20 mg/dL    Lipase Lvl 6 unit/L  LOW    01/04/2019 10:30 EDT Estimated Creatinine Clearance 91.89 mL/min    Radiology results:  Rad Results (ST)   CT Abdomen Pelvis w/ Contrast  ?  01/04/19 11:30:10  CT abdomen pelvis with contrast: 01/04/19    INDICATION:Bowel obstruction, low-grade.    COMPARISON: 02/19/2018 CT    TECHNIQUE: Routine protocol (Axial portal venous phase imaging from the lung  bases to the pubic symphysis following IV contrast with coronal reconstruction.)  CT scanning was performed using radiation dose reduction techniques when  appropriate, per system protocols    FINDINGS:  Lung bases: Clear  Liver: Unremarkable. Minimal focal fatty deposition adjacent to the falciform  ligament.  Gallbladder: Status post cholecystectomy with surgical clips present.  Spleen: Normal.  Pancreas: Normal.  Adrenal glands: Normal.  Kidneys: Normal.  Bowel: No bowel obstruction. Colonic diverticula. Moderate stool in the colon.  Normal appendix.  Free fluid: None.  Lymphadenopathy: None.  Pelvic organs: Multiple uterine fibroids again noted.  Bones: No acute osseous abnormality. Right worse than left hip osteoarthrosis.    Shunt catheter enters the dorsal spinal canal at the L2-3 level. In the  subcutaneous tissues along the right abdominal wall, the catheter demonstrates  some looping/kinking with associated fluid surrounding the catheter.      IMPRESSION:  No acute intra-abdominal or pelvic abnormality.  ?  Signed By: BAElyse Jarvis.   Notes:  Patient is constipated with nodule in the right lower quadrant.  It may represent a deep lipoma in the subcu tissue however could also consider that she has an internal hernia with a low-grade bowel obstruction.  He has diffuse abdominal tenderness.  Due to the constipation and prior surgical history I will obtain CT scan to look for internal hernia.      Impression and Plan   Diagnosis   Abdominal pain (ICD10-CM R10.9, Discharge, Medical)   Constipation (ICD10-CM K59.00, Discharge, Medical)   subcutaneous fluid  collection; lumbar drain      Calls-Consults   -  BAGG-MD,  STEPHEN, We discussed the patient's lumbar drain and the adjacent fluid collection near the tubing.  He thinks it looks like simple fluid not concerning for infection.  It may be related to a seroma reaction or a slow leak.  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    Plan   Condition: Improved.    Disposition: Discharged: to home.    Prescriptions: Launch prescriptions   Pharmacy:  MiraLax oral powder for reconstitution (Prescribe): 17 g, Oral, Daily, for 14 days, 238 g, 0 Refill(s)  ondansetron 4 mg oral tablet (Prescribe): 4 mg, 1 tabs, Oral, q6hr, for 3 days, PRN: as needed for nausea/vomiting, 20 tabs, 0 Refill(s)  Bentyl 20 mg oral tablet (Prescribe): 20 mg, 1 tabs, Oral, QID, for 5 days, PRN: mild pain (1-3), 20 tabs, 0 Refill(s).    Patient was given the following educational materials: Abdominal Pain, Adult, Easy-to-Read, Constipation cleanout, Adult ([id number] Constipation cleanout, Adult ([id number] Abdominal Pain, Adult, Easy-to-Read.    Follow up with:  Follow up with primary care provider Within 2 to 4 days Please follow-up with primary care physician for reevaluation of the abdominal symptoms.  Take the MiraLAX as directed.  Drink plenty of clear liquids.  Return to ER immediately for any new worsening abdominal pain, fever, vomiting, any other concerns; BRIAN CUDDY-MD, Physician - Neurological Surgery Within 1 week This is a neurosurgery or spine specialist.  Please follow-up with this doctor for reevaluation of the tubing in your back and the fluid collection around it.  This may need further testing or treatment.  Return to the ER for any new back pain, flank pain, fever, redness of the skin, any other concerns.    Notes: Patient presents with vague abdominal bloating and some mild diffuse tenderness.  She also complained of a nodule in the right lower quadrant.   CT was obtained that shows no evidence of obstruction.  She has constipation but no other acute findings.  The area in question regarding the firm area that patient was complaining of appears to be consistent with a fluid collection adjacent to tubing from the lumbar drain.  The fluid appears simple and she is a normal white count and no signs to suggest infected area.  She has had fluid collection in the past and this may be a slow leak or adjacent seroma reaction.  I will have her follow-up with local neurosurgery as her prior lumbar surgery was very remote at [facility] and she does not have a neurosurgeon.  Regarding abdominal pain we will treat her for constipation as most likely etiology with no clinical concern for ischemic gut or other acute processes. Chemical engineer Line     Electronically Signed on 01/04/2019 12:01 PM EDT   ________________________________________________   Cathie Hoops A               Modified by: Cathie Hoops A on 01/04/2019 12:01 PM EDT

## 2019-01-04 NOTE — ED Notes (Signed)
 ED Patient Education Note     ;Patient Education Materials Follows:          Take Miralax 1 cap full 3 times per day for 3 days. In addition to the Miralax, you need to drink one cup of liquid at least 8 times per day for the 2 days that they are on this clean out program. A cup is equal to 8 ounces of liquid. You can eat food that is easy for their stomach to process for these two days. Good food choices include applesauce, yogurt, oatmeal, mashed potatoes, soup broth and toast with butter. Drinking a lot of clear liquids will often help you avoid feeling hungry. During the bowel clean out, please plan on being at home for three days because bowel movements will be frequent and hard to predict. It may take three days to completely cleanse the bowel. Once clean out is finished, give a dose of Miralax 1 capfull 1 time each day. The Miralax can be mixed with water or juice. The juice does not have to be clear.     Constipation, Adult  Constipation is when a person has fewer than three bowel movements a week, has difficulty having a bowel movement, or has stools that are dry, hard, or larger than normal. As people grow older, constipation is more common.  A low-fiber diet, not taking in enough fluids, and taking certain medicines may make constipation worse.   CAUSES  . Certain medicines, such as antidepressants, pain medicine, iron supplements, antacids, and water pills.    . Certain diseases, such as diabetes, irritable bowel syndrome (IBS), thyroid disease, or depression.    . Not drinking enough water.    . Not eating enough fiber-rich foods.    . Stress or travel.    . Lack of physical activity or exercise.    . Ignoring the urge to have a bowel movement.    . Using laxatives too much.    SIGNS AND SYMPTOMS  . Having fewer than three bowel movements a week.    . Straining to have a bowel movement.    . Having stools that are hard, dry, or larger than normal.    . Feeling full or bloated.    . Pain in the lower  abdomen.    . Not feeling relief after having a bowel movement.    DIAGNOSIS  Your health care provider will take a medical history and perform a physical exam. Further testing may be done for severe constipation. Some tests may include:  . A barium enema X-ray to examine your rectum, colon, and, sometimes, your small intestine.    . A sigmoidoscopy to examine your lower colon.    . A colonoscopy to examine your entire colon.   TREATMENT  Treatment will depend on the severity of your constipation and what is causing it. Some dietary treatments include drinking more fluids and eating more fiber-rich foods. Lifestyle treatments may include regular exercise. If these diet and lifestyle recommendations do not help, your health care provider may recommend taking over-the-counter laxative medicines to help you have bowel movements. Prescription medicines may be prescribed if over-the-counter medicines do not work.   HOME CARE INSTRUCTIONS  . Eat foods that have a lot of fiber, such as fruits, vegetables, whole grains, and beans.  . Limit foods high in fat and processed sugars, such as french fries, hamburgers, cookies, candies, and soda.    . A fiber supplement may be added to your  diet if you cannot get enough fiber from foods.    . Drink enough fluids to keep your urine clear or pale yellow.    . Exercise regularly or as directed by your health care provider.    . Go to the restroom when you have the urge to go. Do not hold it.    . Only take over-the-counter or prescription medicines as directed by your health care provider. Do not take other medicines for constipation without talking to your health care provider first.     SEEK IMMEDIATE MEDICAL CARE IF:  . You have bright red blood in your stool.    . Your constipation lasts for more than 4 days or gets worse.    . You have abdominal or rectal pain.    . You have thin, pencil-like stools.    . You have unexplained weight loss.   MAKE SURE YOU:  . Understand these  instructions.  . Will watch your condition.  . Will get help right away if you are not doing well or get worse.  This information is not intended to replace advice given to you by your health care provider. Make sure you discuss any questions you have with your health care provider.  Document Released: 06/16/2004 Document Revised: 10/09/2014 Document Reviewed: 06/30/2013  Elsevier Interactive Patient Education 2016 [employer].      Easy-to-Read     Abdominal Pain, Adult    Many things can cause belly (abdominal) pain. Most times, the belly pain is not dangerous. Many cases of belly pain can be watched and treated at home.      HOME CARE     Do not take medicines that help you go poop (laxatives) unless told to by your doctor.      Only take medicine as told by your doctor.     Eat or drink as told by your doctor. Your doctor will tell you if you should be on a special diet.    GET HELP IF:     You do not know what is causing your belly pain.     You have belly pain while you are sick to your stomach (nauseous) or have runny poop (diarrhea).     You have pain while you pee or poop.     Your belly pain wakes you up at night.     You have belly pain that gets worse or better when you eat.     You have belly pain that gets worse when you eat fatty foods.     You have a fever.     GET HELP RIGHT AWAY IF:     The pain does not go away within 2 hours.     You keep throwing up (vomiting).     The pain changes and is only in the right or left part of the belly.     You have bloody or tarry looking poop.    MAKE SURE YOU:     Understand these instructions.     Will watch your condition.     Will get help right away if you are not doing well or get worse.    This information is not intended to replace advice given to you by your health care provider. Make sure you discuss any questions you have with your health care provider.    Document Released: 03/06/2008 Document Revised: 10/09/2014 Document Reviewed:  05/28/2013  Elsevier Interactive Patient Education ?2016 [employer]

## 2019-01-04 NOTE — ED Notes (Signed)
 ED Triage Note       ED Secondary Triage Entered On:  01/04/2019 18:21 EDT    Performed On:  01/04/2019 18:21 EDT by Dannial, RN, Butch LABOR               General Information   Barriers to Learning :   None evident   Languages :   English   ED Home Meds Section :   Document assessment   [facility] ED Fall Risk Section :   Document assessment   ED Advance Directives Section :   Document assessment   Dannial, RN, Butch LABOR - 01/04/2019 18:21 EDT   (As Of: 01/04/2019 18:21:55 EDT)   Problems(Active)    HTN (SNOMED CT  :[id number] )  Name of Problem:   HTN ; Recorder:   CABE, RN, ADAM Z; Confirmation:   Confirmed ; Classification:   Medical ; Code:   AZEGxwEmLzUcjRiawKgAAg ; Contributor System:   PowerChart ; Last Updated:   09/18/2016 13:23 EST ; Life Cycle Date:   09/18/2016 ; Life Cycle Status:   Active ; Vocabulary:   SNOMED CT        Hypercholesteremia (IMO  :53324 )  Name of Problem:   Hypercholesteremia ; Recorder:   CABE, RN, ADAM Z; Confirmation:   Confirmed ; Classification:   Medical ; Code:   53324 ; Contributor System:   PowerChart ; Last Updated:   09/18/2016 13:23 EST ; Life Cycle Date:   09/18/2016 ; Life Cycle Status:   Active ; Vocabulary:   IMO        Hypothyroid (IMO  :[id number] )  Name of Problem:   Hypothyroid ; Recorder:   MALONE, RN, JENNIFER L; Confirmation:   Confirmed ; Classification:   Medical ; Code:   [id number] ; Contributor System:   PowerChart ; Last Updated:   09/19/2016 14:07 EST ; Life Cycle Date:   09/19/2016 ; Life Cycle Status:   Active ; Vocabulary:   IMO          Diagnoses(Active)    Abdominal pain  Date:   01/04/2019 ; Diagnosis Type:   Reason For Visit ; Confirmation:   Complaint of ; Clinical Dx:   Abdominal pain ; Classification:   Medical ; Clinical Service:   Non-Specified ; Code:   PNED ; Probability:   0 ; Diagnosis Code:   [id number]             -    Procedure History   (As Of: 01/04/2019 18:21:55 EDT)     Anesthesia Minutes:   0 ; Procedure Name:   VP  shunt ; Procedure Minutes:   0            Anesthesia Minutes:   0 ; Procedure Name:   Cholecystectomy ; Procedure Minutes:   0            [facility] Fall Risk Assessment Tool   Hx of falling last 3 months ED Fall :   No   Patient confused or disoriented ED Fall :   No   Patient intoxicated or sedated ED Fall :   No   Patient impaired gait ED Fall :   No   Use a mobility assistance device ED Fall :   No   Patient altered elimination ED Fall :   No   [facility] ED Fall Score :   0    Dannial RN, Butch LABOR - 01/04/2019 18:21 EDT   ED Advance Directive  Advance Directive :   No   Felske, RN, Jaclynne A - 01/04/2019 18:21 EDT   Med Hx   Medication List   (As Of: 01/04/2019 18:21:55 EDT)   Normal Order    dicyclomine 10 mg Cap  :   dicyclomine 10 mg Cap ; Status:   Completed ; Ordered As Mnemonic:   Bentyl ; Simple Display Line:   20 mg, 2 caps, Oral, Once ; Ordering Provider:   CURRY-MD,  JASON A; Catalog Code:   dicyclomine ; Order Dt/Tm:   01/04/2019 11:54:17 EDT          magnesium citrate 1.745 g/30 mL Oral Liquid 296 mL  :   magnesium citrate 1.745 g/30 mL Oral Liquid 296 mL ; Status:   Completed ; Ordered As Mnemonic:   magnesium citrate ; Simple Display Line:   150 mL, Oral, Once ; Ordering Provider:   CURRY-MD,  JASON A; Catalog Code:   magnesium citrate ; Order Dt/Tm:   01/04/2019 11:53:58 EDT          iopamidol 61% Inj Soln 100 mL  :   iopamidol 61% Inj Soln 100 mL ; Status:   Completed ; Ordered As Mnemonic:   Isovue-300 ; Simple Display Line:   100 mL, IV Contrast, Once ; Ordering Provider:   CURRY-MD,  JASON A; Catalog Code:   iopamidol ; Order Dt/Tm:   01/04/2019 10:47:47 EDT          ketorolac 15 mg/mL Inj Soln 1 mL  :   ketorolac 15 mg/mL Inj Soln 1 mL ; Status:   Completed ; Ordered As Mnemonic:   Toradol ; Simple Display Line:   15 mg, 1 mL, IV Push, Once ; Ordering Provider:   CURRY-MD,  JASON A; Catalog Code:   ketorolac ; Order Dt/Tm:   01/04/2019 10:47:46 EDT          ondansetron 2 mg/mL Inj Soln 2 mL  :    ondansetron 2 mg/mL Inj Soln 2 mL ; Status:   Completed ; Ordered As Mnemonic:   Zofran ; Simple Display Line:   4 mg, 2 mL, IV Push, Once ; Ordering Provider:   CURRY-MD,  SELINDA A; Catalog Code:   ondansetron ; Order Dt/Tm:   01/04/2019 10:47:47 EDT            Prescription/Discharge Order    dicyclomine  :   dicyclomine ; Status:   Prescribed ; Ordered As Mnemonic:   Bentyl 20 mg oral tablet ; Simple Display Line:   20 mg, 1 tabs, Oral, QID, for 5 days, PRN: mild pain (1-3), 20 tabs, 0 Refill(s) ; Ordering Provider:   MARIAH SELINDA LABOR; Catalog Code:   dicyclomine ; Order Dt/Tm:   01/04/2019 11:56:50 EDT ; Comment:   for abdominal pain          ondansetron  :   ondansetron ; Status:   Prescribed ; Ordered As Mnemonic:   ondansetron 4 mg oral tablet ; Simple Display Line:   4 mg, 1 tabs, Oral, q6hr, for 3 days, PRN: as needed for nausea/vomiting, 20 tabs, 0 Refill(s) ; Ordering Provider:   CURRY-MD,  JASON A; Catalog Code:   ondansetron ; Order Dt/Tm:   01/04/2019 11:56:53 EDT          polyethylene glycol 3350  :   polyethylene glycol 3350 ; Status:   Prescribed ; Ordered As Mnemonic:   MiraLax oral powder for reconstitution ; Simple Display Line:  17 g, Oral, Daily, for 14 days, 238 g, 0 Refill(s) ; Ordering Provider:   CURRY-MD,  JASON A; Catalog Code:   polyethylene glycol 3350 ; Order Dt/Tm:   01/04/2019 11:56:57 EDT          omeprazole  :   omeprazole ; Status:   Prescribed ; Ordered As Mnemonic:   PriLOSEC 20 mg oral delayed release capsule ; Simple Display Line:   20 mg, 1 caps, Oral, Daily, 30 caps, 0 Refill(s) ; Ordering Provider:   PRICE-MD,  KEVIN CHARLES; Catalog Code:   omeprazole ; Order Dt/Tm:   02/19/2018 23:32:47 EDT          meloxicam  :   meloxicam ; Status:   Prescribed ; Ordered As Mnemonic:   meloxicam 7.5 mg oral tablet ; Simple Display Line:   7.5 mg, 1 tabs, Oral, Daily, pain, PRN: other (see comment), 30 tabs, 0 Refill(s) ; Ordering Provider:   CANDIDA ALM NED; Catalog Code:   meloxicam ;  Order Dt/Tm:   09/18/2016 16:55:32 EST            Home Meds    acetaminophen  :   acetaminophen ; Status:   Documented ; Ordered As Mnemonic:   Tylenol Extra Strength 500 mg oral tablet ; Simple Display Line:   mg, tabs, Oral, q6hr, 0 Refill(s) ; Catalog Code:   acetaminophen ; Order Dt/Tm:   01/04/2019 10:30:40 EDT          cyclobenzaprine  :   cyclobenzaprine ; Status:   Documented ; Ordered As Mnemonic:   cyclobenzaprine 10 mg oral tablet ; Simple Display Line:   10 mg, 1 tabs, Oral, TID, PRN, 30 tabs, 0 Refill(s) ; Catalog Code:   cyclobenzaprine ; Order Dt/Tm:   09/29/2016 04:32:44 EST ; Comment:    THIS MEDICATION IS ASSOCIATED   WITH   AN INCREASED RISK OF FALLS.          atenolol  :   atenolol ; Status:   Documented ; Ordered As Mnemonic:   atenolol 100 mg oral tablet ; Simple Display Line:   100 mg, 1 tabs, Oral, Daily, 30 tabs, 0 Refill(s) ; Catalog Code:   atenolol ; Order Dt/Tm:   09/19/2016 14:08:51 EST          fluticasone nasal  :   fluticasone nasal ; Status:   Documented ; Ordered As Mnemonic:   fluticasone 50 mcg/inh nasal spray ; Simple Display Line:   1 sprays, Nasal, BID, 16 g, 0 Refill(s) ; Catalog Code:   fluticasone nasal ; Order Dt/Tm:   09/19/2016 14:08:51 EST          HYDROcodone-acetaminophen  :   HYDROcodone-acetaminophen ; Status:   Documented ; Ordered As Mnemonic:   HYDROcodone-acetaminophen 5-325 EmergiScript ; Simple Display Line:   0 Refill(s) ; Catalog Code:   HYDROcodone-acetaminophen ; Order Dt/Tm:   09/19/2016 14:08:51 EST          levothyroxine  :   levothyroxine ; Status:   Documented ; Ordered As Mnemonic:   levothyroxine 75 mcg (0.075 mg) oral tablet ; Simple Display Line:   75 mcg, 1 tabs, Oral, Daily, 30 tabs, 0 Refill(s) ; Catalog Code:   levothyroxine ; Order Dt/Tm:   09/19/2016 14:08:51 EST          lisinopril  :   lisinopril ; Status:   Documented ; Ordered As Mnemonic:   lisinopril 40 mg oral tablet ; Simple Display Line:  40 mg, 1 tabs, Oral, Daily, 30 tabs, 0  Refill(s) ; Catalog Code:   lisinopril ; Order Dt/Tm:   09/19/2016 14:08:51 EST          meloxicam  :   meloxicam ; Status:   Documented ; Ordered As Mnemonic:   meloxicam 7.5 mg oral tablet ; Simple Display Line:   7.5 mg, 1 tabs, Oral, Daily, 30 tabs, 0 Refill(s) ; Catalog Code:   meloxicam ; Order Dt/Tm:   09/19/2016 14:08:51 EST          oxybutynin  :   oxybutynin ; Status:   Documented ; Ordered As Mnemonic:   oxybutynin 5 mg/24 hours oral tablet, extended release ; Simple Display Line:   5 mg, 1 tabs, Oral, Daily, 30 tabs, 0 Refill(s) ; Catalog Code:   oxybutynin ; Order Dt/Tm:   09/19/2016 14:08:51 EST          simvastatin  :   simvastatin ; Status:   Documented ; Ordered As Mnemonic:   simvastatin 20 mg oral tablet ; Simple Display Line:   20 mg, 1 tabs, Oral, Once a Day (at bedtime), 0 Refill(s) ; Catalog Code:   simvastatin ; Order Dt/Tm:   09/19/2016 14:08:51 EST

## 2019-01-04 NOTE — Discharge Summary (Signed)
 ED Clinical Summary                        [facility]  [address]  [place], [place] [address]  [phone]           PERSON INFORMATION  Name: Kaitlin Porter, Kaitlin Porter Age:  53 Years DOB: [birth year]   Sex: Female Language: English PCP: MELISSA SAVANT   Marital Status: Single Phone: [phone] Med Service: MED-Medicine   MRN: [id number] Acct# [id number] Arrival: 01/04/2019 10:21:00   Visit Reason: CN - Constipation; BLOATING Acuity: 3 LOS: 000 02:02   Address:    [address] [place] [address]   Diagnosis:    Abdominal pain; Constipation  Medications:          New Medications  Printed Prescriptions  dicyclomine (Bentyl 20 mg oral tablet) 1 Tabs Oral (given by mouth) 4 times a day as needed mild pain (1-3) for 5 Days. Refills: 0., for abdominal pain  Last Dose:____________________  ondansetron (ondansetron 4 mg oral tablet) 1 Tabs Oral (given by mouth) every 6 hours as needed as needed for nausea/vomiting for 3 Days. Refills: 0.  Last Dose:____________________  polyethylene glycol 3350 (MiraLax oral powder for reconstitution) 17 Gram Oral (given by mouth) every day for 14 Days. Refills: 0.  Last Dose:____________________  Medications that have not changed  Other Medications  acetaminophen (Tylenol Extra Strength 500 mg oral tablet) Oral (given by mouth) every 6 hours.  Last Dose:____________________  atenolol (atenolol 100 mg oral tablet) 1 Tabs Oral (given by mouth) every day.  Last Dose:____________________  cyclobenzaprine (cyclobenzaprine 10 mg oral tablet) 1 Tabs Oral (given by mouth) 3 times a day as needed.,  THIS MEDICATION IS ASSOCIATED  WITH  AN INCREASED RISK OF FALLS.  Last Dose:____________________  fluticasone nasal (fluticasone 50 mcg/inh nasal spray) 1 Sprays Nasal (into the nose) 2 times a day.  Last Dose:____________________  HYDROcodone-acetaminophen (HYDROcodone-acetaminophen 5-325 EmergiScript)   Last Dose:____________________  levothyroxine (levothyroxine 75 mcg (0.075 mg) oral  tablet) 1 Tabs Oral (given by mouth) every day.  Last Dose:____________________  lisinopril (lisinopril 40 mg oral tablet) 1 Tabs Oral (given by mouth) every day.  Last Dose:____________________  meloxicam (meloxicam 7.5 mg oral tablet) 1 Tabs Oral (given by mouth) every day.  Last Dose:____________________  meloxicam (meloxicam 7.5 mg oral tablet) 1 Tabs Oral (given by mouth) every day as needed other (see comment). pain. Refills: 0.  Last Dose:____________________  omeprazole (PriLOSEC 20 mg oral delayed release capsule) 1 Capsules Oral (given by mouth) every day. Refills: 0.  Last Dose:____________________  oxybutynin (oxybutynin 5 mg/24 hours oral tablet, extended release) 5 Milligram Oral (given by mouth) every day.  Last Dose:____________________  simvastatin (simvastatin 20 mg oral tablet) 1 Tabs Oral (given by mouth) Once a Day (at bedtime).  Last Dose:____________________      Medications Administered During Visit:                Medication Dose Route   ketorolac 15 mg IV Push   ondansetron 4 mg IV Push   iopamidol 100 mL IV Contrast   magnesium citrate 150 mL Oral   dicyclomine 20 mg Oral               Allergies      Lipitor      Major Tests and Procedures:  The following procedures and tests were performed during your ED visit.  COMMON PROCEDURES%>  COMMON PROCEDURES COMMENTS%>  PROVIDER INFORMATION               Provider Role Assigned Sampson Pizza, RN, [place] ED Nurse 01/04/2019 10:22:29    MARIAH MAYO A ED Provider 01/04/2019 10:22:58        Attending Physician:  CURRY-MD,  JASON A      Admit Doc  CURRY-MD,  JASON A     Consulting Doc       VITALS INFORMATION  Vital Sign Triage Latest   Temp Oral ORAL_1%> ORAL%>   Temp Temporal TEMPORAL_1%> TEMPORAL%>   Temp Intravascular INTRAVASCULAR_1%> INTRAVASCULAR%>   Temp Axillary AXILLARY_1%> AXILLARY%>   Temp Rectal RECTAL_1%> RECTAL%>   02 Sat 99 % 100 %   Respiratory Rate RATE_1%> RATE%>   Peripheral Pulse Rate PULSE RATE_1%> PULSE RATE%>    Apical Heart Rate HEART RATE_1%> HEART RATE%>   Blood Pressure BLOOD PRESSURE_1%>/ BLOOD PRESSURE_1%>109 mmHg BLOOD PRESSURE%> / BLOOD PRESSURE%>95 mmHg                 Immunizations      No Immunizations Documented This Visit          DISCHARGE INFORMATION   Discharge Disposition: H Outpt-Sent Home   Discharge Location:  Home   Discharge Date and Time:  01/04/2019 12:23:04   ED Checkout Date and Time:  01/04/2019 12:23:04     DEPART REASON INCOMPLETE INFORMATION               Depart Action Incomplete Reason   Interactive View/I&O Recently assessed               Problems      Active           Hypothyroid          Hypercholesteremia          HTN              Smoking Status      Former smoker         PATIENT EDUCATION INFORMATION  Instructions:     Constipation cleanout, Adult ([id number]); Abdominal Pain, Adult, Easy-to-Read     Follow up:                   With: Address: When:   BRIAN CUDDY-MD, Physician - Neurological Surgery [address], [address] [place], [place] [address]  [phone] Business (1) Within 1 week   Comments:   This is a neurosurgery or spine specialist. Please follow-up with this doctor for reevaluation of the tubing in your back and the fluid collection around it. This may need further testing or treatment. Return to the ER for any new back pain, flank pain, fever, redness of the skin, any other concerns       With: Address: When:   Follow up with primary care provider  Within 2 to 4 days   Comments:   Please follow-up with primary care physician for reevaluation of the abdominal symptoms. Take the MiraLAX as directed. Drink plenty of clear liquids. Return to ER immediately for any new worsening abdominal pain, fever, vomiting, any other concerns              ED PROVIDER DOCUMENTATION     Patient:   Kaitlin Porter, Kaitlin Porter             MRN: [id number]            FIN: [phone]  Age:   66 years     Sex:  Female     DOB:  10/06/1965   Associated Diagnoses:   Abdominal pain; Constipation    Author:   CURRY-MD,  JASON A      Basic Information   Time seen: Provider Seen (ST)   ED Provider/Time:    CURRY-MD,  JASON A / 01/04/2019 10:22  .   Additional information: Chief Complaint from Nursing Triage Note   Chief Complaint  Chief Complaint: i feel bloated i took colace yesterday without relief last bowel movement over a week ago (01/04/19 10:25:00).      History of Present Illness   Patient complains of 3 days of right-sided abdominal pain described as pain and bloating.  She notes that she has a hard knot in the right abdomen.  She has been constipated has not had a bowel movement approximately 1 week.  She typically only has a bowel movement about twice per week.  She denies any recent blood in the stool.  She has had some mild rectal discomfort.  She feels nauseous but has not had any vomiting.  No history of bowel obstruction.  She is status post cholecystectomy remotely.  She has a remote history of cervical spine surgery years ago but no noted lumbar surgical history.  She has some paresthesias in the anterior bilateral thighs but denies any saddle anesthesia no urinary incontinence or fecal incontinence.  Denies fevers or chills.  She feels little short of breath sometimes but has not had a cough or chest pain.  Moderate severity.  No alleviating factors at home.  No radiation.        Review of Systems             Additional review of systems information: All other systems reviewed and otherwise negative.      Health Status   Allergies:    Allergic Reactions (All)  Mild  Lipitor- No reactions were documented.  Canceled/Inactive Reactions (All)  No Known Allergies.   Medications:  (Selected)   Inpatient Medications  Ordered  Isovue-300: 100 mL, IV Contrast, Once  Toradol: 15 mg, 1 mL, IV Push, Once  Zofran: 4 mg, 2 mL, IV Push, Once  Prescriptions  Prescribed  PriLOSEC 20 mg oral delayed release capsule: 20 mg, 1 caps, Oral, Daily, 30 caps, 0 Refill(s)  meloxicam 7.5 mg oral tablet: 7.5 mg, 1  tabs, Oral, Daily, pain, PRN: other (see comment), 30 tabs, 0 Refill(s)  Documented Medications  Documented  HYDROcodone-acetaminophen 5-325 EmergiScript: 0 Refill(s)  Tylenol Extra Strength 500 mg oral tablet: mg, tabs, Oral, q6hr, 0 Refill(s)  atenolol 100 mg oral tablet: 100 mg, 1 tabs, Oral, Daily, 30 tabs, 0 Refill(s)  cyclobenzaprine 10 mg oral tablet: 10 mg, 1 tabs, Oral, TID, PRN, 30 tabs, 0 Refill(s)  fluticasone 50 mcg/inh nasal spray: 1 sprays, Nasal, BID, 16 g, 0 Refill(s)  levothyroxine 75 mcg (0.075 mg) oral tablet: 75 mcg, 1 tabs, Oral, Daily, 30 tabs, 0 Refill(s)  lisinopril 40 mg oral tablet: 40 mg, 1 tabs, Oral, Daily, 30 tabs, 0 Refill(s)  meloxicam 7.5 mg oral tablet: 7.5 mg, 1 tabs, Oral, Daily, 30 tabs, 0 Refill(s)  oxybutynin 5 mg/24 hours oral tablet, extended release: 5 mg, 1 tabs, Oral, Daily, 30 tabs, 0 Refill(s)  simvastatin 20 mg oral tablet: 20 mg, 1 tabs, Oral, Once a Day (at bedtime), 0 Refill(s).      Past Medical/ Family/ Social History   Medical history: Reviewed  as documented in chart.   Surgical history: Reviewed as documented in chart.   Family history: Not significant.   Social history: Reviewed as documented in chart.   Problem list:    Active Problems (3)  HTN   Hypercholesteremia   Hypothyroid   .      Physical Examination               Vital Signs   Vital Signs   01/04/2019 10:25 EDT Systolic Blood Pressure 176 mmHg  HI    Diastolic Blood Pressure 109 mmHg  >HHI    Temperature Oral 36.9 degC    Heart Rate Monitored 95 bpm    Respiratory Rate 16 br/min    SpO2 99 %    Measurements   01/04/2019 10:30 EDT Body Mass Index est meas 31.33 kg/m2    Body Mass Index Measured 31.33 kg/m2   01/04/2019 10:25 EDT Height/Length Measured 167.6 cm    Weight Dosing 88 kg    Basic Oxygen Information   01/04/2019 10:25 EDT SpO2 99 %    Oxygen Therapy Room air    General:  Alert, no acute distress.    Skin:  Warm, dry, pink.    Head:  Normocephalic, atraumatic.    Eye:  Extraocular movements are  intact, normal conjunctiva.    Cardiovascular:  Regular rate and rhythm, No murmur, Normal peripheral perfusion.    Respiratory:  Lungs are clear to auscultation, respirations are non-labored, breath sounds are equal.    Gastrointestinal:  Soft, Non distended, Normal bowel sounds, Patient has mild diffuse abdominal tenderness more prominent in the right upper right lower quadrant.  There is a firm area measuring about 5 x 3 cm in the right lower quadrant and subcutaneous tissue.  Is not fluctuant..    Musculoskeletal:  Normal ROM, normal strength.    Neurological:  Alert and oriented to person, place, time, and situation, No focal neurological deficit observed.    Psychiatric:  Cooperative, appropriate mood & affect.       Medical Decision Making   Documents reviewed:  Emergency department nurses' notes, flowsheet, vital signs.    Results review:  Lab results : Lab View   01/04/2019 11:21 EDT Estimated Creatinine Clearance 91.89 mL/min   01/04/2019 10:57 EDT WBC 6.6 x10e3/mcL    RBC 4.01 x10e6/mcL    Hgb 11.3 g/dL  LOW    HCT 64.1 %  LOW    MCV 89.3 fL    MCH 28.2 pg    MCHC 31.6 g/dL  LOW    RDW 85.1 %    Platelet 202 x10e3/mcL    MPV 10.9 fL    NRBC Absolute Auto 0.00 x10e3/mcL  NA    NRBC Percent Auto 0.0 %  NA    Sodium Lvl 137 mmol/L    Potassium Lvl 4.0 mmol/L    Chloride 99 mmol/L    CO2 22 mmol/L    Glucose Random 94 mg/dL    BUN 14 mg/dL    Creatinine Lvl 0.8 mg/dL    AGAP 16 mmol/L    Osmolality Calc 274 mOsm/kg    Calcium Lvl 9.3 mg/dL    Protein Total 7.6 g/dL    Albumin Lvl 4.6 g/dL    Alk Phos 73 unit/L    AST 17 unit/L    ALT 6 unit/L    eGFR AA 98 mL/min/1.24m    eGFR Non-AA 85 mL/min/1.60m  LOW    Bili Total 0.70 mg/dL    Bili Direct <  0.20 mg/dL    Lipase Lvl 6 unit/L  LOW   01/04/2019 10:30 EDT Estimated Creatinine Clearance 91.89 mL/min    Radiology results:  Rad Results (ST)   CT Abdomen Pelvis w/ Contrast  ?  01/04/19 11:30:10  CT abdomen pelvis with contrast: 01/04/19    INDICATION:Bowel  obstruction, low-grade.    COMPARISON: 02/19/2018 CT    TECHNIQUE: Routine protocol (Axial portal venous phase imaging from the lung  bases to the pubic symphysis following IV contrast with coronal reconstruction.)  CT scanning was performed using radiation dose reduction techniques when  appropriate, per system protocols    FINDINGS:  Lung bases: Clear  Liver: Unremarkable. Minimal focal fatty deposition adjacent to the falciform  ligament.  Gallbladder: Status post cholecystectomy with surgical clips present.  Spleen: Normal.  Pancreas: Normal.  Adrenal glands: Normal.  Kidneys: Normal.  Bowel: No bowel obstruction. Colonic diverticula. Moderate stool in the colon.  Normal appendix.  Free fluid: None.  Lymphadenopathy: None.  Pelvic organs: Multiple uterine fibroids again noted.  Bones: No acute osseous abnormality. Right worse than left hip osteoarthrosis.    Shunt catheter enters the dorsal spinal canal at the L2-3 level. In the  subcutaneous tissues along the right abdominal wall, the catheter demonstrates  some looping/kinking with associated fluid surrounding the catheter.      IMPRESSION:  No acute intra-abdominal or pelvic abnormality.  ?  Signed By: BEVERELY SENIOR  .   Notes:  Patient is constipated with nodule in the right lower quadrant.  It may represent a deep lipoma in the subcu tissue however could also consider that she has an internal hernia with a low-grade bowel obstruction.  He has diffuse abdominal tenderness.  Due to the constipation and prior surgical history I will obtain CT scan to look for internal hernia.      Impression and Plan   Diagnosis   Abdominal pain (ICD10-CM R10.9, Discharge, Medical)   Constipation (ICD10-CM K59.00, Discharge, Medical)   subcutaneous fluid collection; lumbar drain      Calls-Consults   -  BAGG-MD,  STEPHEN, We discussed the patient's lumbar drain and the adjacent fluid collection near the tubing.  He thinks it looks like simple fluid not concerning for  infection.  It may be related to a seroma reaction or a slow leak.  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    Plan   Condition: Improved.    Disposition: Discharged: to home.    Prescriptions: Launch prescriptions   Pharmacy:  MiraLax oral powder for reconstitution (Prescribe): 17 g, Oral, Daily, for 14 days, 238 g, 0 Refill(s)  ondansetron 4 mg oral tablet (Prescribe): 4 mg, 1 tabs, Oral, q6hr, for 3 days, PRN: as needed for nausea/vomiting, 20 tabs, 0 Refill(s)  Bentyl 20 mg oral tablet (Prescribe): 20 mg, 1 tabs, Oral, QID, for 5 days, PRN: mild pain (1-3), 20 tabs, 0 Refill(s).    Patient was given the following educational materials: Abdominal Pain, Adult, Easy-to-Read, Constipation cleanout, Adult (FI60331), Constipation cleanout, Adult (FI60331), Abdominal Pain, Adult, Easy-to-Read.    Follow up with: Follow up with primary care provider Within 2 to 4 days Please follow-up with primary care physician for reevaluation of the abdominal symptoms.  Take the MiraLAX as directed.  Drink plenty of clear liquids.  Return to ER immediately for any new worsening abdominal pain, fever, vomiting, any other concerns; BRIAN CUDDY-MD, Physician -  Neurological Surgery Within 1 week This is a neurosurgery or spine specialist.  Please follow-up with this doctor for reevaluation of the tubing in your back and the fluid collection around it.  This may need further testing or treatment.  Return to the ER for any new back pain, flank pain, fever, redness of the skin, any other concerns.    Notes: Patient presents with vague abdominal bloating and some mild diffuse tenderness.  She also complained of a nodule in the right lower quadrant.  CT was obtained that shows no evidence of obstruction.  She has constipation but no other acute findings.  The area in question regarding the firm area that patient was complaining of appears to be consistent with a  fluid collection adjacent to tubing from the lumbar drain.  The fluid appears simple and she is a normal white count and no signs to suggest infected area.  She has had fluid collection in the past and this may be a slow leak or adjacent seroma reaction.  I will have her follow-up with local neurosurgery as her prior lumbar surgery was very remote at [facility] and she does not have a neurosurgeon.  Regarding abdominal pain we will treat her for constipation as most likely etiology with no clinical concern for ischemic gut or other acute processes. SABRA

## 2019-01-04 NOTE — ED Notes (Signed)
 ED Patient Summary       ;       [facility] Emergency Department  [address] [place], [place] [address]  [phone]  Discharge Instructions (Patient)  _______________________________________     Name: Kaitlin Porter, Kaitlin Porter  DOB:  06-16-1966                   MRN: [id number]                   FIN: [id number]  Reason For Visit: CN - Constipation; BLOATING  Final Diagnosis: Abdominal pain; Constipation     Visit Date: 01/04/2019 10:21:00  Address: [address] [facility] [address]  Phone: [phone]     Primary Care Provider:      Name: MELISSA SAVANT      Phone: [phone]        Emergency Department Providers:         Primary Physician:   MARIAH SELINDA DELENA Florie Lionel would like to thank you for allowing us  to assist you with your healthcare needs. The following includes patient education materials and information regarding your injury/illness.     Follow-up Instructions: You were treated today on an emergency basis, it may be wise to contact your primary care provider to notify them of your visit today. You may have been referred to your regular doctor or a specialist, please follow up as instructed. If your condition worsens or you can't get in to see the doctor, contact the Emergency Department.              With: Address: When:   BRIAN CUDDY-MD, Physician - Neurological Surgery [address], [address] [place], [place] [address]  [phone] Business (1) Within 1 week   Comments:   This is a neurosurgery or spine specialist. Please follow-up with this doctor for reevaluation of the tubing in your back and the fluid collection around it. This may need further testing or treatment. Return to the ER for any new back pain, flank pain, fever, redness of the skin, any other concerns       With: Address: When:   Follow up with primary care provider  Within 2 to 4 days   Comments:   Please follow-up with primary care physician for reevaluation of the abdominal symptoms. Take the  MiraLAX as directed. Drink plenty of clear liquids. Return to ER immediately for any new worsening abdominal pain, fever, vomiting, any other concerns              Printed Prescriptions:    Patient Education Materials:  Discharge Orders          Discharge Patient 01/04/19 11:57:00 EDT         Comment:      Constipation cleanout, Adult ([id number]); Abdominal Pain, Adult, Easy-to-Read          Take Miralax 1 cap full 3 times per day for 3 days. In addition to the Miralax, you need to drink one cup of liquid at least 8 times per day for the 2 days that they are on this clean out program. A cup is equal to 8 ounces of liquid. You can eat food that is easy for their stomach to process for these two days. Good food choices include applesauce, yogurt, oatmeal, mashed potatoes, soup broth and toast with butter. Drinking a lot of clear liquids will often help you avoid feeling hungry.  During the bowel clean out, please plan on being at home for three days because bowel movements will be frequent and hard to predict. It may take three days to completely cleanse the bowel. Once clean out is finished, give a dose of Miralax 1 capfull 1 time each day. The Miralax can be mixed with water or juice. The juice does not have to be clear.     Constipation, Adult  Constipation is when a person has fewer than three bowel movements a week, has difficulty having a bowel movement, or has stools that are dry, hard, or larger than normal. As people grow older, constipation is more common.  A low-fiber diet, not taking in enough fluids, and taking certain medicines may make constipation worse.   CAUSES  . Certain medicines, such as antidepressants, pain medicine, iron supplements, antacids, and water pills.    . Certain diseases, such as diabetes, irritable bowel syndrome (IBS), thyroid disease, or depression.    . Not drinking enough water.    . Not eating enough fiber-rich foods.    . Stress or travel.    . Lack of physical activity or  exercise.    . Ignoring the urge to have a bowel movement.    . Using laxatives too much.    SIGNS AND SYMPTOMS  . Having fewer than three bowel movements a week.    . Straining to have a bowel movement.    . Having stools that are hard, dry, or larger than normal.    . Feeling full or bloated.    . Pain in the lower abdomen.    . Not feeling relief after having a bowel movement.    DIAGNOSIS  Your health care provider will take a medical history and perform a physical exam. Further testing may be done for severe constipation. Some tests may include:  . A barium enema X-ray to examine your rectum, colon, and, sometimes, your small intestine.    . A sigmoidoscopy to examine your lower colon.    . A colonoscopy to examine your entire colon.   TREATMENT  Treatment will depend on the severity of your constipation and what is causing it. Some dietary treatments include drinking more fluids and eating more fiber-rich foods. Lifestyle treatments may include regular exercise. If these diet and lifestyle recommendations do not help, your health care provider may recommend taking over-the-counter laxative medicines to help you have bowel movements. Prescription medicines may be prescribed if over-the-counter medicines do not work.   HOME CARE INSTRUCTIONS  . Eat foods that have a lot of fiber, such as fruits, vegetables, whole grains, and beans.  . Limit foods high in fat and processed sugars, such as french fries, hamburgers, cookies, candies, and soda.    . A fiber supplement may be added to your diet if you cannot get enough fiber from foods.    . Drink enough fluids to keep your urine clear or pale yellow.    . Exercise regularly or as directed by your health care provider.    . Go to the restroom when you have the urge to go. Do not hold it.    . Only take over-the-counter or prescription medicines as directed by your health care provider. Do not take other medicines for constipation without talking to your health care  provider first.     SEEK IMMEDIATE MEDICAL CARE IF:  . You have bright red blood in your stool.    . Your constipation lasts for  more than 4 days or gets worse.    . You have abdominal or rectal pain.    . You have thin, pencil-like stools.    . You have unexplained weight loss.   MAKE SURE YOU:  . Understand these instructions.  . Will watch your condition.  . Will get help right away if you are not doing well or get worse.  This information is not intended to replace advice given to you by your health care provider. Make sure you discuss any questions you have with your health care provider.  Document Released: 06/16/2004 Document Revised: 10/09/2014 Document Reviewed: 06/30/2013  Elsevier Interactive Patient Education 2016 Elsevier Inc.       Abdominal Pain, Adult    Many things can cause belly (abdominal) pain. Most times, the belly pain is not dangerous. Many cases of belly pain can be watched and treated at home.      HOME CARE     Do not take medicines that help you go poop (laxatives) unless told to by your doctor.      Only take medicine as told by your doctor.     Eat or drink as told by your doctor. Your doctor will tell you if you should be on a special diet.    GET HELP IF:     You do not know what is causing your belly pain.     You have belly pain while you are sick to your stomach (nauseous) or have runny poop (diarrhea).     You have pain while you pee or poop.     Your belly pain wakes you up at night.     You have belly pain that gets worse or better when you eat.     You have belly pain that gets worse when you eat fatty foods.     You have a fever.     GET HELP RIGHT AWAY IF:     The pain does not go away within 2 hours.     You keep throwing up (vomiting).     The pain changes and is only in the right or left part of the belly.     You have bloody or tarry looking poop.    MAKE SURE YOU:     Understand these instructions.     Will watch your condition.     Will get help right away if you are  not doing well or get worse.    This information is not intended to replace advice given to you by your health care provider. Make sure you discuss any questions you have with your health care provider.    Document Released: 03/06/2008 Document Revised: 10/09/2014 Document Reviewed: 05/28/2013  Elsevier Interactive Patient Education ?2016 Elsevier Inc.         Allergy Info: Lipitor     Medication Information:  [facility] ED Physicians provided you with a complete list of medications post discharge, if you have been instructed to stop taking a medication please ensure you also follow up with this information to your Primary Care Physician.  Unless otherwise noted, patient will continue to take medications as prescribed prior to the Emergency Room visit.  Any specific questions regarding your chronic medications and dosages should be discussed with your physician(s) and pharmacist.          acetaminophen (Tylenol Extra Strength 500 mg oral tablet) Oral (given by mouth) every 6 hours.  atenolol (atenolol 100 mg oral tablet)  1 Tabs Oral (given by mouth) every day.  cyclobenzaprine (cyclobenzaprine 10 mg oral tablet) 1 Tabs Oral (given by mouth) 3 times a day as needed.,  THIS MEDICATION IS ASSOCIATED  WITH  AN INCREASED RISK OF FALLS.  dicyclomine (Bentyl 20 mg oral tablet) 1 Tabs Oral (given by mouth) 4 times a day as needed mild pain (1-3) for 5 Days. Refills: 0., for abdominal pain  fluticasone nasal (fluticasone 50 mcg/inh nasal spray) 1 Sprays Nasal (into the nose) 2 times a day.  HYDROcodone-acetaminophen (HYDROcodone-acetaminophen 5-325 EmergiScript)  levothyroxine (levothyroxine 75 mcg (0.075 mg) oral tablet) 1 Tabs Oral (given by mouth) every day.  lisinopril (lisinopril 40 mg oral tablet) 1 Tabs Oral (given by mouth) every day.  meloxicam (meloxicam 7.5 mg oral tablet) 1 Tabs Oral (given by mouth) every day.  meloxicam (meloxicam 7.5 mg oral tablet) 1 Tabs Oral (given by mouth) every day as needed other  (see comment). pain. Refills: 0.  omeprazole (PriLOSEC 20 mg oral delayed release capsule) 1 Capsules Oral (given by mouth) every day. Refills: 0.  ondansetron (ondansetron 4 mg oral tablet) 1 Tabs Oral (given by mouth) every 6 hours as needed as needed for nausea/vomiting for 3 Days. Refills: 0.  oxybutynin (oxybutynin 5 mg/24 hours oral tablet, extended release) 5 Milligram Oral (given by mouth) every day.  polyethylene glycol 3350 (MiraLax oral powder for reconstitution) 17 Gram Oral (given by mouth) every day for 14 Days. Refills: 0.  simvastatin (simvastatin 20 mg oral tablet) 1 Tabs Oral (given by mouth) Once a Day (at bedtime).      Medications Administered During Visit:              Medication Dose Route   ketorolac 15 mg IV Push   ondansetron 4 mg IV Push   iopamidol 100 mL IV Contrast   magnesium citrate 150 mL Oral   dicyclomine 20 mg Oral          Major Tests and Procedures:  The following procedures and tests were performed during your ED visit.  COMMON PROCEDURES%>  COMMON PROCEDURES COMMENTS%>          Laboratory Orders  Name Status Details   BMP Completed Blood, Stat, ST - Stat, 01/04/19 10:47:00 EDT, 01/04/19 10:47:00 EDT, Nurse collect, CURRY-MD,  JASON A, Print label Y/N   CBC Completed Blood, Stat, ST - Stat, 01/04/19 10:47:00 EDT, 01/04/19 10:47:00 EDT, Nurse collect, CURRY-MD,  JASON A, Print label Y/N   Hepatic Completed Blood, Stat, ST - Stat, 01/04/19 10:47:00 EDT, 01/04/19 10:47:00 EDT, Nurse collect, CURRY-MD,  JASON A, Print label Y/N   Lipase Lvl Completed Blood, Stat, ST - Stat, 01/04/19 10:47:00 EDT, 01/04/19 10:47:00 EDT, Nurse collect, CURRY-MD,  JASON A, Print label Y/N   Irwin Carbon Completed Blood, Stat, ST - Stat, Collected, 01/04/19 10:57:00 EDT [facility], 01/04/19 10:57:00 EDT, Nurse collect, Venous Draw, 01/04/19 11:03:00 EDT, RH CP Login, CURRY-MD,  JASON A, Print label Y/N, rh_accession_1, 1.8 mL Aolz/*[id number]*/, Complete               Radiology Orders  Name Status Details    CT Abdomen Pelvis w/ Contrast Completed 01/04/19 10:47:00 EDT, STAT 1 hour or less, Reason: Bowel obstruction, low-grade, Transport ModeBETHA CALAMITY, pp_set_radiology_subspecialty, [id number], 8               Patient Care Orders  Name Status Details   Discharge Patient Ordered 01/04/19 11:57:00 EDT   ED Assessment Adult Completed 01/04/19 10:30:57 EDT, 01/04/19 10:30:57 EDT  ED Secondary Triage Completed 01/04/19 10:30:57 EDT, 01/04/19 10:30:57 EDT   ED Triage Adult Completed 01/04/19 10:21:40 EDT, 01/04/19 10:21:40 EDT   POC-Urine Dipstick collect Ordered 01/04/19 10:47:00 EDT, Once, 01/04/19 10:47:00 EDT   Saline Lock Insert Completed 01/04/19 10:47:00 EDT, Once, 01/04/19 10:47:00 EDT       ---------------------------------------------------------------------------------------------------------------------  [facility] allows you to manage your health, view your test results, and retrieve your discharge documents from your hospital stay securely and conveniently from your computer.     To begin the enrollment process, visit [email or web address] Click on "Sign up now" under [facility].   Comment:

## 2019-01-04 NOTE — ED Notes (Signed)
ED Pre-Arrival Note        Pre-Arrival Summary    Name:  C32,    Current Date:  01/04/2019 18:18:30 EDT  Gender:  Female  Date of Birth:    Age:  53  Pre-Arrival Type:  EMS  ETA:  01/04/2019 18:35:00 EDT  Primary Care Physician:    Presenting Problem:  Abdominal Pain  Pre-Arrival User:  Lyndon Code, RN, Rolin Barry  Referring Source:    Location:  [facility]            PreArrival Communication Form  Emergency Department        Additional Patient Information:        Orders:  [    ] CBC                                            [     ] CT Head no contrast  [    ] BMP                                           [     ] CT Abdomen/Pelvis no contrast  [    ] PT/INR                                       [     ] CT Abdomen/Pelvis IV contrast, w/ oral contrast  [    ] Troponin                                   [     ] CT Abdomen/Pelvis IV contrast, no oral contrast  [    ] BNP                                            [     ] See ordersheet  [    ] CXR                                             [     ] Other:__________________________  [    ] EKG

## 2019-01-04 NOTE — ED Notes (Signed)
ED Patient Education Note     Patient Education Materials Follows:           Intestinal Gas and Gas Pains  It is normal to have intestinal gas and gas pains from time to time. Gas can be caused by many things, including:  . Foods that have a lot of fiber, such as fruits, whole grains, vegetables, and peas and beans.  . Swallowed air. You can swallow air when you are nervous, eat too fast, chew gum, or drink through a straw.  . Antibiotic medicines.  . Food additives.  . Constipation.  . Diarrhea.  Sometimes gas and gas pains can be a sign of a medical problem, such as:  Marland Kitchen Lactose intolerance. Lactose is a sugar that occurs naturally in milk and other dairy products.  . Gluten intolerance. Gluten is a protein that is found in wheat and some other grains.  . An intolerance to foods.  HOME CARE INSTRUCTIONS  Watch your gas or gas pains for any changes. The following actions may help to lessen any discomfort that you are feeling.    Tips to Help Older Children  . Eat slowly and avoid swallowing a lot of air when eating.  . Avoid chewing gum.  . Try removing one type of food or drink from your diet each week to see if your problems decrease. Foods or drinks that can cause gas or gas pains include:  ? Juices with high fructose content, such as apple, pear, grape, and prune juice.  ? Foods with artificial sweeteners, such as most sugar-free drinks, candy, and gum.  ? Carbonated drinks.  ? Milk and other dairy products.  ? Foods with gluten, such as wheat bread.  . Do not restrict your fiber intake unless directed to do so by your health care provider. Although fiber can cause gas, it is an important part of you diet.    SEEK MEDICAL CARE IF:  . Your gas or gas pains get worse.  . You eliminate dairy products or foods with gluten from your own diet for one week and have less gas. This can be a sign of lactose or gluten intolerance.  . You have diarrhea or loose stools for more than one week.  This information is not  intended to replace advice given to you by your health care provider. Make sure you discuss any questions you have with your health care provider.  Document Released: 07/16/2007 Document Revised: 10/09/2014 Document Reviewed: 04/27/2014  Elsevier Interactive Patient Education [employer]

## 2019-01-04 NOTE — ED Notes (Signed)
ED Note-Nursing               pt declined attempt to obtain urine sample. states needs to drink water patient made aware she could not have anything to eat or drink until after completion of evaluation. pt verbalized understanding  [employer]

## 2019-01-04 NOTE — ED Notes (Signed)
ED Patient Education Note     ;Patient Education Materials Follows:          Take Miralax 1 cap full 3 times per day for 3 days. In addition to the Miralax, you need to drink one cup of liquid at least 8 times per day for the 2 days that they are on this clean out program. A cup is equal to 8 ounces of liquid. You can eat food that is easy for their stomach to process for these two days. Good food choices include applesauce, yogurt, oatmeal, mashed potatoes, soup broth and toast with butter. Drinking a lot of clear liquids will often help you avoid feeling hungry. During the bowel clean out, please plan on being at home for three days because bowel movements will be frequent and hard to predict. It may take three days to completely cleanse the bowel. Once clean out is finished, give a dose of Miralax 1 capfull 1 time each day. The Miralax can be mixed with water or juice. The juice does not have to be clear.     Constipation, Adult  Constipation is when a person has fewer than three bowel movements a week, has difficulty having a bowel movement, or has stools that are dry, hard, or larger than normal. As people grow older, constipation is more common.  A low-fiber diet, not taking in enough fluids, and taking certain medicines may make constipation worse.   CAUSES  ??? Certain medicines, such as antidepressants, pain medicine, iron supplements, antacids, and water pills.    ??? Certain diseases, such as diabetes, irritable bowel syndrome (IBS), thyroid disease, or depression.    ??? Not drinking enough water.    ??? Not eating enough fiber-rich foods.    ??? Stress or travel.    ??? Lack of physical activity or exercise.    ??? Ignoring the urge to have a bowel movement.    ??? Using laxatives too much.    SIGNS AND SYMPTOMS  ??? Having fewer than three bowel movements a week.    ??? Straining to have a bowel movement.    ??? Having stools that are hard, dry, or larger than normal.    ??? Feeling full or bloated.    ??? Pain in the lower  abdomen.    ??? Not feeling relief after having a bowel movement.    DIAGNOSIS  Your health care provider will take a medical history and perform a physical exam. Further testing may be done for severe constipation. Some tests may include:  ??? A barium enema X-ray to examine your rectum, colon, and, sometimes, your small intestine.    ??? A sigmoidoscopy to examine your lower colon.    ??? A colonoscopy to examine your entire colon.   TREATMENT  Treatment will depend on the severity of your constipation and what is causing it. Some dietary treatments include drinking more fluids and eating more fiber-rich foods. Lifestyle treatments may include regular exercise. If these diet and lifestyle recommendations do not help, your health care provider may recommend taking over-the-counter laxative medicines to help you have bowel movements. Prescription medicines may be prescribed if over-the-counter medicines do not work.   HOME CARE INSTRUCTIONS  ??? Eat foods that have a lot of fiber, such as fruits, vegetables, whole grains, and beans.  ??? Limit foods high in fat and processed sugars, such as french fries, hamburgers, cookies, candies, and soda.    ??? A fiber supplement may be added to your  diet if you cannot get enough fiber from foods.    ??? Drink enough fluids to keep your urine clear or pale yellow.    ??? Exercise regularly or as directed by your health care provider.    ??? Go to the restroom when you have the urge to go. Do not hold it.    ??? Only take over-the-counter or prescription medicines as directed by your health care provider. Do not take other medicines for constipation without talking to your health care provider first.     SEEK IMMEDIATE MEDICAL CARE IF:  ??? You have bright red blood in your stool.    ??? Your constipation lasts for more than 4 days or gets worse.    ??? You have abdominal or rectal pain.    ??? You have thin, pencil-like stools.    ??? You have unexplained weight loss.   MAKE SURE YOU:  ??? Understand these  instructions.  ??? Will watch your condition.  ??? Will get help right away if you are not doing well or get worse.  This information is not intended to replace advice given to you by your health care provider. Make sure you discuss any questions you have with your health care provider.  Document Released: 06/16/2004 Document Revised: 10/09/2014 Document Reviewed: 06/30/2013  Elsevier Interactive Patient Education ??2016 Elsevier Inc.      Easy-to-Read     Abdominal Pain, Adult    Many things can cause belly (abdominal) pain. Most times, the belly pain is not dangerous. Many cases of belly pain can be watched and treated at home.      HOME CARE     Do not take medicines that help you go poop (laxatives) unless told to by your doctor.      Only take medicine as told by your doctor.     Eat or drink as told by your doctor. Your doctor will tell you if you should be on a special diet.    GET HELP IF:     You do not know what is causing your belly pain.     You have belly pain while you are sick to your stomach (nauseous) or have runny poop (diarrhea).     You have pain while you pee or poop.     Your belly pain wakes you up at night.     You have belly pain that gets worse or better when you eat.     You have belly pain that gets worse when you eat fatty foods.     You have a fever.     GET HELP RIGHT AWAY IF:     The pain does not go away within 2 hours.     You keep throwing up (vomiting).     The pain changes and is only in the right or left part of the belly.     You have bloody or tarry looking poop.    MAKE SURE YOU:     Understand these instructions.     Will watch your condition.     Will get help right away if you are not doing well or get worse.    This information is not intended to replace advice given to you by your health care provider. Make sure you discuss any questions you have with your health care provider.    Document Released: 03/06/2008 Document Revised: 10/09/2014 Document Reviewed:  05/28/2013  Elsevier Interactive Patient Education ?2016 Elsevier Inc.

## 2019-01-04 NOTE — Discharge Summary (Signed)
 ED Clinical Summary                     [facility]  [address]  [place], [place] [address]  [phone]          PERSON INFORMATION  Name: Kaitlin Porter, Kaitlin Porter Age:  53 Years DOB: May 05, 1966   Sex: Female Language: English PCP: MELISSA SAVANT   Marital Status: Single Phone: [phone] Med Service: MED-Medicine   MRN: [id number] Acct# [id number] Arrival: 01/04/2019 18:17:00   Visit Reason: Abdominal pain; ABD PAINS Acuity: 3 LOS: 000 01:34   Address:    [address] [place] [address]   Diagnosis:    Abdominal gas pain; Constipation  Medications:          Medications that have not changed  Other Medications  acetaminophen (Tylenol Extra Strength 500 mg oral tablet) Oral (given by mouth) every 6 hours.  Last Dose:____________________  atenolol (atenolol 100 mg oral tablet) 1 Tabs Oral (given by mouth) every day.  Last Dose:____________________  cyclobenzaprine (cyclobenzaprine 10 mg oral tablet) 1 Tabs Oral (given by mouth) 3 times a day as needed.,  THIS MEDICATION IS ASSOCIATED  WITH  AN INCREASED RISK OF FALLS.  Last Dose:____________________  dicyclomine (Bentyl 20 mg oral tablet) 1 Tabs Oral (given by mouth) 4 times a day as needed mild pain (1-3) for 5 Days. Refills: 0., for abdominal pain  Last Dose:____________________  fluticasone nasal (fluticasone 50 mcg/inh nasal spray) 1 Sprays Nasal (into the nose) 2 times a day.  Last Dose:____________________  HYDROcodone-acetaminophen (HYDROcodone-acetaminophen 5-325 EmergiScript)   Last Dose:____________________  levothyroxine (levothyroxine 75 mcg (0.075 mg) oral tablet) 1 Tabs Oral (given by mouth) every day.  Last Dose:____________________  lisinopril (lisinopril 40 mg oral tablet) 1 Tabs Oral (given by mouth) every day.  Last Dose:____________________  meloxicam (meloxicam 7.5 mg oral tablet) 1 Tabs Oral (given by mouth) every day.  Last Dose:____________________  meloxicam (meloxicam 7.5 mg oral tablet) 1 Tabs Oral (given  by mouth) every day as needed other (see comment). pain. Refills: 0.  Last Dose:____________________  omeprazole (PriLOSEC 20 mg oral delayed release capsule) 1 Capsules Oral (given by mouth) every day. Refills: 0.  Last Dose:____________________  ondansetron (ondansetron 4 mg oral tablet) 1 Tabs Oral (given by mouth) every 6 hours as needed as needed for nausea/vomiting for 3 Days. Refills: 0.  Last Dose:____________________  oxybutynin (oxybutynin 5 mg/24 hours oral tablet, extended release) 5 Milligram Oral (given by mouth) every day.  Last Dose:____________________  polyethylene glycol 3350 (MiraLax oral powder for reconstitution) 17 Gram Oral (given by mouth) every day for 14 Days. Refills: 0.  Last Dose:____________________  simvastatin (simvastatin 20 mg oral tablet) 1 Tabs Oral (given by mouth) Once a Day (at bedtime).  Last Dose:____________________      Medications Administered During Visit:                Medication Dose Route   simethicone 80 mg Chewed               Allergies      Lipitor      Major Tests and Procedures:  The following procedures and tests were performed during your ED visit.  COMMON PROCEDURES%>  COMMON PROCEDURES COMMENTS%>                PROVIDER INFORMATION               Provider Role Assigned Unassigned  KARL HOLLERING [facility] ED Provider 01/04/2019 18:19:22    Dannial, RN, [facility] LABOR ED Nurse 01/04/2019 18:22:00        Attending Physician:  KING-MD,  MELANIE ELAINE      Admit Doc  KING-MD,  MELANIE ELAINE     Consulting Doc       VITALS INFORMATION  Vital Sign Triage Latest   Temp Oral ORAL_1%> ORAL%>   Temp Temporal TEMPORAL_1%> TEMPORAL%>   Temp Intravascular INTRAVASCULAR_1%> INTRAVASCULAR%>   Temp Axillary AXILLARY_1%> AXILLARY%>   Temp Rectal RECTAL_1%> RECTAL%>   02 Sat 100 % 100 %   Respiratory Rate RATE_1%> RATE%>   Peripheral Pulse Rate PULSE RATE_1%> PULSE RATE%>   Apical Heart Rate HEART RATE_1%> HEART RATE%>   Blood Pressure BLOOD PRESSURE_1%>/ BLOOD PRESSURE_1%>92 mmHg  BLOOD PRESSURE%> / BLOOD PRESSURE%>86 mmHg                 Immunizations      No Immunizations Documented This Visit          DISCHARGE INFORMATION   Discharge Disposition: H Outpt-Sent Home   Discharge Location:  Home   Discharge Date and Time:  01/04/2019 19:51:15   ED Checkout Date and Time:  01/04/2019 19:51:15     DEPART REASON INCOMPLETE INFORMATION               Depart Action Incomplete Reason   Interactive View/I&O Recently assessed               Problems      Active           Hypothyroid          Hypercholesteremia          HTN              Smoking Status      Former smoker         PATIENT EDUCATION INFORMATION  Instructions:     Landscape architect and Gas Pains, Adult ([id number])     Follow up:                   With: Address: When:   SAMUEL MCNULTY-MD, View Only Providers - No Access [address], [address] [place] [address]  [phone] Business (1) Within 1 week       With: Address: When:   Be sure to follow up as directed from your [place] visit earlier with both your primary care physician and Neurosurgery.  Within 1 week   Comments:   You can take some Gas-x over the counter for gas pains. Follow up with Dr. Corey if your constipation does not resolve over the weekend with the Miralax.              ED PROVIDER DOCUMENTATION     Patient:   Kaitlin Porter, Kaitlin Porter             MRN: [id number]            FIN: [phone]               Age:   53 years     Sex:  Female     DOB:  10-04-1965   Associated Diagnoses:   Abdominal gas pain; Constipation   Author:   KING-MD,  MELANIE ELAINE      Basic Information   Time seen: Provider Seen (ST)   ED Provider/Time:    KING-MD,  MELANIE ELAINE / 01/04/2019 18:19  .   Additional information:  Chief Complaint from Nursing Triage Note   Chief Complaint  Chief Complaint: Pt c/o lower abd pain radiating to back x2 weeks. pt also reports not having a BM for weeks. pt was seen at [facility] today for same. (01/04/19 18:18:00).      History of Present Illness   Kaitlin Porter is a 53 year old African  American female who presents via EMS with abdominal pain.  Pt. was seen at [place] earlier today for the same complaint.  Pt received a full workup there with bloodwork and a CT scan and was determined to have no bowel obstruction but was diagnosed with constipation.  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  Pt presents c/o sharp pains in the abdomen since taking the Miralax that she was prescribed earlier today.  States she has not had a bowel movement. States the cramping in her abdomen is very painful.       Review of Systems   Constitutional symptoms:  Negative except as documented in HPI.   Skin symptoms:  Negative except as documented in HPI.   ENMT symptoms:  Negative except as documented in HPI.   Respiratory symptoms:  Negative except as documented in HPI.   Cardiovascular symptoms:  Negative except as documented in HPI.   Gastrointestinal symptoms:  Abdominal pain, diffuse, cramping, constipation.    Genitourinary symptoms:  Negative except as documented in HPI.   Neurologic symptoms:  Negative except as documented in HPI.             Additional review of systems information: All other systems reviewed and otherwise negative.      Health Status   Allergies:    Allergic Reactions (All)  Mild  Lipitor- No reactions were documented.  Canceled/Inactive Reactions (All)  No Known Allergies.   Medications:  (Selected)   Inpatient Medications  Ordered  simethicone: 40 mg, 0.6 mL, Oral, Once  Prescriptions  Prescribed  Bentyl 20 mg oral tablet: 20 mg, 1 tabs, Oral, QID, for 5 days, PRN: mild pain (1-3), 20 tabs, 0 Refill(s)  MiraLax oral powder for reconstitution: 17 g, Oral, Daily, for 14 days, 238 g, 0 Refill(s)  PriLOSEC 20 mg oral delayed release capsule: 20 mg, 1 caps, Oral, Daily, 30 caps, 0  Refill(s)  meloxicam 7.5 mg oral tablet: 7.5 mg, 1 tabs, Oral, Daily, pain, PRN: other (see comment), 30 tabs, 0 Refill(s)  ondansetron 4 mg oral tablet: 4 mg, 1 tabs, Oral, q6hr, for 3 days, PRN: as needed for nausea/vomiting, 20 tabs, 0 Refill(s)  Documented Medications  Documented  HYDROcodone-acetaminophen 5-325 EmergiScript: 0 Refill(s)  Tylenol Extra Strength 500 mg oral tablet: mg, tabs, Oral, q6hr, 0 Refill(s)  atenolol 100 mg oral tablet: 100 mg, 1 tabs, Oral, Daily, 30 tabs, 0 Refill(s)  cyclobenzaprine 10 mg oral tablet: 10 mg, 1 tabs, Oral, TID, PRN, 30 tabs, 0 Refill(s)  fluticasone 50 mcg/inh nasal spray: 1 sprays, Nasal, BID, 16 g, 0 Refill(s)  levothyroxine 75 mcg (0.075 mg) oral tablet: 75 mcg, 1 tabs, Oral, Daily, 30 tabs, 0 Refill(s)  lisinopril 40 mg oral tablet: 40 mg, 1 tabs, Oral, Daily, 30 tabs, 0 Refill(s)  meloxicam 7.5 mg oral tablet: 7.5 mg, 1 tabs, Oral, Daily, 30 tabs, 0 Refill(s)  oxybutynin 5  mg/24 hours oral tablet, extended release: 5 mg, 1 tabs, Oral, Daily, 30 tabs, 0 Refill(s)  simvastatin 20 mg oral tablet: 20 mg, 1 tabs, Oral, Once a Day (at bedtime), 0 Refill(s).      Past Medical/ Family/ Social History   Problem list:    Active Problems (3)  HTN   Hypercholesteremia   Hypothyroid   .      Physical Examination               Vital Signs   Vital Signs   01/04/2019 18:24 EDT Respiratory Rate 16 br/min   01/04/2019 18:18 EDT Systolic Blood Pressure 139 mmHg    Diastolic Blood Pressure 92 mmHg  HI    Temperature Oral 36.2 degC    Heart Rate Monitored 77 bpm    Respiratory Rate 17 br/min    SpO2 100 %   01/04/2019 12:05 EDT Temperature Oral 36.5 degC   01/04/2019 11:40 EDT Systolic Blood Pressure 175 mmHg  HI    Diastolic Blood Pressure 95 mmHg  HI    Heart Rate Monitored 80 bpm    Respiratory Rate 16 br/min    SpO2 100 %   01/04/2019 11:12 EDT Systolic Blood Pressure 161 mmHg  HI    Diastolic Blood Pressure 96 mmHg  HI    Heart Rate Monitored 88 bpm    Respiratory Rate 16 br/min    SpO2 99  %   01/04/2019 10:25 EDT Systolic Blood Pressure 176 mmHg  HI    Diastolic Blood Pressure 109 mmHg  >HHI    Temperature Oral 36.9 degC    Heart Rate Monitored 95 bpm    Respiratory Rate 16 br/min    SpO2 99 %    Measurements   01/04/2019 18:21 EDT Body Mass Index est meas 35.96 kg/m2    Body Mass Index Measured 35.96 kg/m2   01/04/2019 18:18 EDT Height/Length Measured 167.6 cm    Weight Dosing 101 kg   01/04/2019 10:30 EDT Body Mass Index est meas 31.33 kg/m2    Body Mass Index Measured 31.33 kg/m2   01/04/2019 10:25 EDT Height/Length Measured 167.6 cm    Weight Dosing 88 kg    General:  Alert, no acute distress.    Skin:  Warm.   Head:  Normocephalic.   Eye:  Pupils are equal, round and reactive to light.   Ears, nose, mouth and throat:  Oral mucosa moist.   Cardiovascular:  Regular rate and rhythm.   Respiratory:  Lungs are clear to auscultation.   Gastrointestinal:  Soft, Non distended, Tenderness: Moderate, generalized, Guarding: Negative, Rebound: Negative, Bowel sounds: Hyperactive.    Back:  Nontender, Normal range of motion.    Musculoskeletal:  Normal ROM.   Neurological:  Alert and oriented to person, place, time, and situation, No focal neurological deficit observed.    Psychiatric:  Cooperative.      Medical Decision Making   Differential Diagnosis:  Abdominal pain, constipation.    Documents reviewed:  Emergency department nurses' notes, flowsheet, emergency department records, prior records, vital signs, reviewed records from [facility] from earlier today.  Please refer to those for pt's workup including CT scan..    Notes:  pt seemed angry when I asked her what else I could do for her after she received a full workup at [facility] today.  She asked me if I could do a CT scan of her abdomen but she just received one earlier today approximately 6 hours ago.   I explained  this to her and told her that her bowel sounds are hyperactive likely giving her gas pains from the Miralax and that I can offer her something to calm  down the gas pains but there is nothing else diagnostic that they did not do earlier.  She agreed to try some simethicone..      Reexamination/ Reevaluation   Time: 01/04/2019 19:40:00 .   Course: improving.   Pain status: decreased.      Impression and Plan   Diagnosis   Abdominal gas pain (ICD10-CM R14.1, Discharge, Medical)   Constipation (ICD10-CM K59.00, Discharge, Medical)   Plan   Condition: Stable.    Disposition: Discharged: to home.    Patient was given the following educational materials: Intestinal Gas and Gas Pains, Adult ([id number]).    Follow up with: Be sure to follow up as directed from your [place] visit earlier with both your primary care physician and Neurosurgery. Within 1 week You can take some Gas-x over the counter for gas pains.  Follow up with Dr. Corey if your constipation does not resolve over the weekend with the Miralax. ; SAMUEL MCNULTY-MD, View Only Providers - No Access Within 1 week.    Counseled: Patient.

## 2019-01-04 NOTE — ED Notes (Signed)
 ED Patient Summary              [facility] Emergency Department  [address] [place] [address]  [phone]  Discharge Instructions (Patient)  _______________________________________     Name: Kaitlin Porter, Kaitlin Porter  DOB: 03/02/1966                   MRN: [id number]                   FIN: [id number]  Reason For Visit: Abdominal pain; ABD PAINS  Final Diagnosis: Abdominal gas pain; Constipation     Visit Date: 01/04/2019 18:17:00  Address: [address] [place] [address]  Phone: [phone]     Primary Care Provider:      Name: MELISSA SAVANT      Phone: [phone]        Emergency Department Providers:         Primary Physician:   KING-MD, MELANIE ELAINE         [facility] would like to thank you for allowing us  to assist you with your healthcare needs. The following includes patient education materials and information regarding your injury/illness.     Follow-up Instructions: You were treated today on an emergency basis, it may be wise to contact your primary care provider to notify them of your visit today. You may have been referred to your regular doctor or a specialist, please follow up as instructed. If your condition worsens or you can't get in to see the doctor, contact the Emergency Department.              With: Address: When:   SAMUEL MCNULTY-MD, View Only Providers - No Access [address], [address] [place], [place] [address]  [phone] Business (1) Within 1 week       With: Address: When:   Be sure to follow up as directed from your [place] visit earlier with both your primary care physician and Neurosurgery.  Within 1 week   Comments:   You can take some Gas-x over the counter for gas pains. Follow up with Dr. Corey if your constipation does not resolve over the weekend with the Miralax.              Printed Prescriptions:    Patient Education Materials:  Discharge Orders          Discharge Patient 01/04/19 19:40:00 EDT  Discharge Patient 01/04/19 19:40:00  EDT         Comment:      Intestinal Gas and Gas Pains, Adult ([id number])           Intestinal Gas and Gas Pains  It is normal to have intestinal gas and gas pains from time to time. Gas can be caused by many things, including:  . Foods that have a lot of fiber, such as fruits, whole grains, vegetables, and peas and beans.  . Swallowed air. You can swallow air when you are nervous, eat too fast, chew gum, or drink through a straw.  . Antibiotic medicines.  . Food additives.  . Constipation.  . Diarrhea.  Sometimes gas and gas pains can be a sign of a medical problem, such as:  SABRA Lactose intolerance. Lactose is a sugar that occurs naturally in milk and other dairy products.  . Gluten intolerance. Gluten is a protein that is found in wheat and some other grains.  . An intolerance to foods.  HOME  CARE INSTRUCTIONS  Watch your gas or gas pains for any changes. The following actions may help to lessen any discomfort that you are feeling.    Tips to Help Older Children  . Eat slowly and avoid swallowing a lot of air when eating.  . Avoid chewing gum.  . Try removing one type of food or drink from your diet each week to see if your problems decrease. Foods or drinks that can cause gas or gas pains include:  ? Juices with high fructose content, such as apple, pear, grape, and prune juice.  ? Foods with artificial sweeteners, such as most sugar-free drinks, candy, and gum.  ? Carbonated drinks.  ? Milk and other dairy products.  ? Foods with gluten, such as wheat bread.  . Do not restrict your fiber intake unless directed to do so by your health care provider. Although fiber can cause gas, it is an important part of you diet.    SEEK MEDICAL CARE IF:  . Your gas or gas pains get worse.  . You eliminate dairy products or foods with gluten from your own diet for one week and have less gas. This can be a sign of lactose or gluten intolerance.  . You have diarrhea or loose stools for more than one week.  This information is not  intended to replace advice given to you by your health care provider. Make sure you discuss any questions you have with your health care provider.  Document Released: 07/16/2007 Document Revised: 10/09/2014 Document Reviewed: 04/27/2014  Elsevier Interactive Patient Education 2016 [employer].         Allergy Info: Lipitor     Medication Information:  [facility] Phys[facility] provided you with a complete list of medications post discharge, if you have been instructed to stop taking a medication please ensure you also follow up with this information to your Primary Care Physician.  Unless otherwise noted, patient will continue to take medications as prescribed prior to the Emergency Room visit.  Any specific questions regarding your chronic medications and dosages should be discussed with your physician(s) and pharmacist.          acetaminophen (Tylenol Extra Strength 500 mg oral tablet) Oral (given by mouth) every 6 hours.  atenolol (atenolol 100 mg oral tablet) 1 Tabs Oral (given by mouth) every day.  cyclobenzaprine (cyclobenzaprine 10 mg oral tablet) 1 Tabs Oral (given by mouth) 3 times a day as needed.,  THIS MEDICATION IS ASSOCIATED  WITH  AN INCREASED RISK OF FALLS.  dicyclomine (Bentyl 20 mg oral tablet) 1 Tabs Oral (given by mouth) 4 times a day as needed mild pain (1-3) for 5 Days. Refills: 0., for abdominal pain  fluticasone nasal (fluticasone 50 mcg/inh nasal spray) 1 Sprays Nasal (into the nose) 2 times a day.  HYDROcodone-acetaminophen (HYDROcodone-acetaminophen 5-325 EmergiScript)  levothyroxine (levothyroxine 75 mcg (0.075 mg) oral tablet) 1 Tabs Oral (given by mouth) every day.  lisinopril (lisinopril 40 mg oral tablet) 1 Tabs Oral (given by mouth) every day.  meloxicam (meloxicam 7.5 mg oral tablet) 1 Tabs Oral (given by mouth) every day.  meloxicam (meloxicam 7.5 mg oral tablet) 1 Tabs Oral (given by mouth) every day as needed other (see comment). pain. Refills: 0.  omeprazole  (PriLOSEC 20 mg oral delayed release capsule) 1 Capsules Oral (given by mouth) every day. Refills: 0.  ondansetron (ondansetron 4 mg oral tablet) 1 Tabs Oral (given by mouth) every 6 hours as needed as needed for  nausea/vomiting for 3 Days. Refills: 0.  oxybutynin (oxybutynin 5 mg/24 hours oral tablet, extended release) 5 Milligram Oral (given by mouth) every day.  polyethylene glycol 3350 (MiraLax oral powder for reconstitution) 17 Gram Oral (given by mouth) every day for 14 Days. Refills: 0.  simvastatin (simvastatin 20 mg oral tablet) 1 Tabs Oral (given by mouth) Once a Day (at bedtime).      Medications Administered During Visit:              Medication Dose Route   simethicone 80 mg Chewed          Major Tests and Procedures:  The following procedures and tests were performed during your ED visit.  COMMON PROCEDURES%>  COMMON PROCEDURES COMMENTS%>          Laboratory Orders  No laboratory orders were placed.              Radiology Orders  No radiology orders were placed.              Patient Care Orders  Name Status Details   Discharge Patient Ordered 01/04/19 19:40:00 EDT   Discharge Patient Ordered 01/04/19 19:40:00 EDT   ED 72 hr Return Ordered 01/04/19 18:17:12 EDT, This message can only be seen by Nursing, it is not visible to Pharmacy, Laboratory, or Radiology., 01/04/19 18:17:12 EDT   ED Assessment Adult Completed 01/04/19 18:21:10 EDT, 01/04/19 18:21:10 EDT   ED Secondary Triage Completed 01/04/19 18:21:10 EDT, 01/04/19 18:21:10 EDT   ED Triage Adult Completed 01/04/19 18:17:11 EDT, 01/04/19 18:17:11 EDT       ---------------------------------------------------------------------------------------------------------------------  [facility] allows you to manage your health, view your test results, and retrieve your discharge documents from your hospital stay securely and conveniently from your computer.     To begin the enrollment process, visit [email or web address] Click on "Sign up now" under  [facility]   Comment:

## 2019-01-16 NOTE — ED Notes (Signed)
 ED Triage Note       ED Secondary Triage Entered On:  01/16/2019 7:16 EDT    Performed On:  01/16/2019 7:15 EDT by Hollice, RN, Reyes Broom Information   Barriers to Learning :   None evident   ED Home Meds Section :   Document assessment   [facility] ED Fall Risk Section :   Document assessment   ED Advance Directives Section :   Document assessment   Hollice OBIE Reyes - 01/16/2019 7:15 EDT   (As Of: 01/16/2019 07:16:20 EDT)   Problems(Active)    HTN (SNOMED CT  :AZEGxwEmLzUcjRiawKgAAg )  Name of Problem:   HTN ; Recorder:   CABE, RN, ADAM Z; Confirmation:   Confirmed ; Classification:   Medical ; Code:   AZEGxwEmLzUcjRiawKgAAg ; Contributor System:   PowerChart ; Last Updated:   09/18/2016 13:23 EST ; Life Cycle Date:   09/18/2016 ; Life Cycle Status:   Active ; Vocabulary:   SNOMED CT        Hypercholesteremia (IMO  :53324 )  Name of Problem:   Hypercholesteremia ; Recorder:   CABE, RN, ADAM Z; Confirmation:   Confirmed ; Classification:   Medical ; Code:   53324 ; Contributor System:   PowerChart ; Last Updated:   09/18/2016 13:23 EST ; Life Cycle Date:   09/18/2016 ; Life Cycle Status:   Active ; Vocabulary:   IMO        Hypothyroid (IMO  :[id number] )  Name of Problem:   Hypothyroid ; Recorder:   MALONE, RN, JENNIFER L; Confirmation:   Confirmed ; Classification:   Medical ; Code:   [id number] ; Contributor System:   PowerChart ; Last Updated:   09/19/2016 14:07 EST ; Life Cycle Date:   09/19/2016 ; Life Cycle Status:   Active ; Vocabulary:   IMO          Diagnoses(Active)    Medical screening exam  Date:   01/16/2019 ; Diagnosis Type:   Reason For Visit ; Confirmation:   Complaint of ; Clinical Dx:   Medical screening exam ; Classification:   Medical ; Clinical Service:   Non-Specified ; Code:   PNED ; Probability:   0 ; Diagnosis Code:   [id number]      Psychiatric screening exam  Date:   01/16/2019 ; Diagnosis Type:   Reason For Visit ; Confirmation:   Complaint of ; Clinical  Dx:   Psychiatric screening exam ; Classification:   Medical ; Clinical Service:   Non-Specified ; Code:   PNED ; Probability:   0 ; Diagnosis Code:   [id number]             -    Procedure History   (As Of: 01/16/2019 07:16:21 EDT)     Anesthesia Minutes:   0 ; Procedure Name:   VP shunt ; Procedure Minutes:   0            Anesthesia Minutes:   0 ; Procedure Name:   Cholecystectomy ; Procedure Minutes:   0            [facility] Fall Risk Assessment Tool   Hx of falling last 3 months ED Fall :   No   Patient confused or disoriented ED Fall :   No   Patient intoxicated or sedated ED Fall :   No   Patient impaired gait ED Fall :  No   Use a mobility assistance device ED Fall :   No   Patient altered elimination ED Fall :   No   [facility] ED Fall Score :   0    Hollice OBIE Purchase - 01/16/2019 7:15 EDT   ED Advance Directive   Advance Directive :   No   Hollice, RN, Purchase - 01/16/2019 7:15 EDT   Med Hx   Medication List   (As Of: 01/16/2019 07:16:21 EDT)   Prescription/Discharge Order    dicyclomine  :   dicyclomine ; Status:   Prescribed ; Ordered As Mnemonic:   Bentyl 20 mg oral tablet ; Simple Display Line:   20 mg, 1 tabs, Oral, QID, for 5 days, PRN: mild pain (1-3), 20 tabs, 0 Refill(s) ; Ordering Provider:   MARIAH SELINDA LABOR; Catalog Code:   dicyclomine ; Order Dt/Tm:   01/04/2019 11:56:50 EDT ; Comment:   for abdominal pain          ondansetron  :   ondansetron ; Status:   Prescribed ; Ordered As Mnemonic:   ondansetron 4 mg oral tablet ; Simple Display Line:   4 mg, 1 tabs, Oral, q6hr, for 3 days, PRN: as needed for nausea/vomiting, 20 tabs, 0 Refill(s) ; Ordering Provider:   CURRY-MD,  JASON A; Catalog Code:   ondansetron ; Order Dt/Tm:   01/04/2019 11:56:53 EDT          polyethylene glycol 3350  :   polyethylene glycol 3350 ; Status:   Prescribed ; Ordered As Mnemonic:   MiraLax oral powder for reconstitution ; Simple Display Line:   17 g, Oral, Daily, for 14 days, 238 g, 0 Refill(s) ;  Ordering Provider:   CURRY-MD,  JASON A; Catalog Code:   polyethylene glycol 3350 ; Order Dt/Tm:   01/04/2019 11:56:57 EDT          omeprazole  :   omeprazole ; Status:   Prescribed ; Ordered As Mnemonic:   PriLOSEC 20 mg oral delayed release capsule ; Simple Display Line:   20 mg, 1 caps, Oral, Daily, 30 caps, 0 Refill(s) ; Ordering Provider:   PRICE-MD,  KEVIN CHARLES; Catalog Code:   omeprazole ; Order Dt/Tm:   02/19/2018 23:32:47 EDT          meloxicam  :   meloxicam ; Status:   Prescribed ; Ordered As Mnemonic:   meloxicam 7.5 mg oral tablet ; Simple Display Line:   7.5 mg, 1 tabs, Oral, Daily, pain, PRN: other (see comment), 30 tabs, 0 Refill(s) ; Ordering Provider:   CANDIDA ALM NED; Catalog Code:   meloxicam ; Order Dt/Tm:   09/18/2016 16:55:32 EST            Home Meds    acetaminophen  :   acetaminophen ; Status:   Documented ; Ordered As Mnemonic:   Tylenol Extra Strength 500 mg oral tablet ; Simple Display Line:   mg, tabs, Oral, q6hr, 0 Refill(s) ; Catalog Code:   acetaminophen ; Order Dt/Tm:   01/04/2019 10:30:40 EDT          cyclobenzaprine  :   cyclobenzaprine ; Status:   Documented ; Ordered As Mnemonic:   cyclobenzaprine 10 mg oral tablet ; Simple Display Line:   10 mg, 1 tabs, Oral, TID, PRN, 30 tabs, 0 Refill(s) ; Catalog Code:   cyclobenzaprine ; Order Dt/Tm:   09/29/2016 04:32:44 EST ; Comment:    THIS MEDICATION IS ASSOCIATED  WITH   AN INCREASED RISK OF FALLS.          atenolol  :   atenolol ; Status:   Documented ; Ordered As Mnemonic:   atenolol 100 mg oral tablet ; Simple Display Line:   100 mg, 1 tabs, Oral, Daily, 30 tabs, 0 Refill(s) ; Catalog Code:   atenolol ; Order Dt/Tm:   09/19/2016 14:08:51 EST          fluticasone nasal  :   fluticasone nasal ; Status:   Documented ; Ordered As Mnemonic:   fluticasone 50 mcg/inh nasal spray ; Simple Display Line:   1 sprays, Nasal, BID, 16 g, 0 Refill(s) ; Catalog Code:   fluticasone nasal ; Order Dt/Tm:   09/19/2016 14:08:51 EST           HYDROcodone-acetaminophen  :   HYDROcodone-acetaminophen ; Status:   Documented ; Ordered As Mnemonic:   HYDROcodone-acetaminophen 5-325 EmergiScript ; Simple Display Line:   0 Refill(s) ; Catalog Code:   HYDROcodone-acetaminophen ; Order Dt/Tm:   09/19/2016 14:08:51 EST          levothyroxine  :   levothyroxine ; Status:   Documented ; Ordered As Mnemonic:   levothyroxine 75 mcg (0.075 mg) oral tablet ; Simple Display Line:   75 mcg, 1 tabs, Oral, Daily, 30 tabs, 0 Refill(s) ; Catalog Code:   levothyroxine ; Order Dt/Tm:   09/19/2016 14:08:51 EST          lisinopril  :   lisinopril ; Status:   Documented ; Ordered As Mnemonic:   lisinopril 40 mg oral tablet ; Simple Display Line:   40 mg, 1 tabs, Oral, Daily, 30 tabs, 0 Refill(s) ; Catalog Code:   lisinopril ; Order Dt/Tm:   09/19/2016 14:08:51 EST          meloxicam  :   meloxicam ; Status:   Documented ; Ordered As Mnemonic:   meloxicam 7.5 mg oral tablet ; Simple Display Line:   7.5 mg, 1 tabs, Oral, Daily, 30 tabs, 0 Refill(s) ; Catalog Code:   meloxicam ; Order Dt/Tm:   09/19/2016 14:08:51 EST          oxybutynin  :   oxybutynin ; Status:   Documented ; Ordered As Mnemonic:   oxybutynin 5 mg/24 hours oral tablet, extended release ; Simple Display Line:   5 mg, 1 tabs, Oral, Daily, 30 tabs, 0 Refill(s) ; Catalog Code:   oxybutynin ; Order Dt/Tm:   09/19/2016 14:08:51 EST          simvastatin  :   simvastatin ; Status:   Documented ; Ordered As Mnemonic:   simvastatin 20 mg oral tablet ; Simple Display Line:   20 mg, 1 tabs, Oral, Once a Day (at bedtime), 0 Refill(s) ; Catalog Code:   simvastatin ; Order Dt/Tm:   09/19/2016 14:08:51 EST

## 2019-01-16 NOTE — ED Notes (Signed)
ED Triage Note       ED Triage Adult Entered On:  01/16/2019 7:14 EDT    Performed On:  01/16/2019 7:10 EDT by Joycie PeekLammers, RN, Tinnie GensJeffrey               Triage   Chief Complaint :   patient from home where she was brought in by EMS stating she feels bugs are crawling on her.  No bugs seen on triage or by EMS. patient states it feels like the bugs are on the inside.  going on for a couple days.  BGL 127 by ems   Numeric Rating Pain Scale :   0 = No pain   [place] Mode of Arrival :   Ambulance   Infectious Disease Documentation :   Document assessment   Temperature Oral :   36.7 degC(Converted to: 98.1 degF)    Heart Rate Monitored :   80 bpm   Respiratory Rate :   15 br/min   Systolic Blood Pressure :   152 mmHg (HI)    Diastolic Blood Pressure :   89 mmHg   SpO2 :   100 %   Oxygen Therapy :   Room air   Patient presentation :   None of the above   Chief Complaint or Presentation suggest infection :   No   Weight Dosing :   90 kg(Converted to: 198 lb 7 oz)    Height :   167 cm(Converted to: 5 ft 6 in)    Body Mass Index Dosing :   32 kg/m2   Jonna CoupLammers, RN, Jeffrey - 01/16/2019 7:10 EDT   DCP GENERIC CODE   Tracking Acuity :   4   Tracking Group :   ED [address] [facility] [employer]   [place], RN, Tinnie GensJeffrey - 01/16/2019 7:10 EDT   ED General Section :   Document assessment   Pregnancy Status :   Patient denies   ED Allergies Section :   Document assessment   ED Reason for Visit Section :   Document assessment   ED Quick Assessment :   Patient appears awake, alert, oriented to baseline. Skin warm and dry. Moves all extremities. Respiration even and unlabored. Appears in no apparent distress.   Joycie PeekLammers, RN, Tinnie GensJeffrey - 01/16/2019 7:10 EDT   ID Risk Screen Symptoms   Recent Travel History :   No recent travel   Close Contact with COVID-19  ID :   No   Have you been tested for COVID-19 ID :   No   TB Symptom Screen :   No symptoms   C. diff Symptom/History ID :   Neither of the above   Joycie PeekLammers, RTinnie Gens, Jeffrey - 01/16/2019 7:10 EDT   Allergies   (As  Of: 01/16/2019 07:14:34 EDT)   Allergies (Active)   Lipitor  Estimated Onset Date:   Unspecified ; Created ByMarrion Coy:   ALICEA, RN, LARA J; Reaction Status:   Active ; Category:   Drug ; Substance:   Lipitor ; Type:   Allergy ; Severity:   Mild ; Updated By:   Cato MulliganALICEA, RN, LARA J; Reviewed Date:   01/16/2019 7:11 EDT        Psycho-Social   Last 3 mo, thoughts killing self/others :   Patient denies   Jonna CoupLammers, RN, Jeffrey - 01/16/2019 7:10 EDT   ED Reason for Visit   (As Of: 01/16/2019 07:14:34 EDT)   Problems(Active)    HTN (SNOMED CT  :AZEGxwEmLzUcjRiawKgAAg )  Name  of Problem:   HTN ; Recorder:   CABE, RN, ADAM Z; Confirmation:   Confirmed ; Classification:   Medical ; Code:   AZEGxwEmLzUcjRiawKgAAg ; Contributor System:   Dietitian ; Last Updated:   09/18/2016 13:23 EST ; Life Cycle Date:   09/18/2016 ; Life Cycle Status:   Active ; Vocabulary:   SNOMED CT        Hypercholesteremia (IMO  :02409 )  Name of Problem:   Hypercholesteremia ; Recorder:   CABE, RN, ADAM Z; Confirmation:   Confirmed ; Classification:   Medical ; Code:   73532 ; Contributor System:   PowerChart ; Last Updated:   09/18/2016 13:23 EST ; Life Cycle Date:   09/18/2016 ; Life Cycle Status:   Active ; Vocabulary:   IMO        Hypothyroid (IMO  :[id number] )  Name of Problem:   Hypothyroid ; Recorder:   MALONE, RN, JENNIFER L; Confirmation:   Confirmed ; Classification:   Medical ; Code:   [id number] ; Contributor System:   Dietitian ; Last Updated:   09/19/2016 14:07 EST ; Life Cycle Date:   09/19/2016 ; Life Cycle Status:   Active ; Vocabulary:   IMO          Diagnoses(Active)    Medical screening exam  Date:   01/16/2019 ; Diagnosis Type:   Reason For Visit ; Confirmation:   Complaint of ; Clinical Dx:   Medical screening exam ; Classification:   Medical ; Clinical Service:   Non-Specified ; Code:   PNED ; Probability:   0 ; Diagnosis Code:   [id number]      Psychiatric screening exam  Date:   01/16/2019 ; Diagnosis Type:   Reason For  Visit ; Confirmation:   Complaint of ; Clinical Dx:   Psychiatric screening exam ; Classification:   Medical ; Clinical Service:   Non-Specified ; Code:   PNED ; Probability:   0 ; Diagnosis Code:   [id number]

## 2019-01-16 NOTE — ED Notes (Signed)
ED Pre-Arrival Note        Pre-Arrival Summary    Name:  ccems 7,    Current Date:  01/16/2019 07:10:51 EDT  Gender:  Female  Date of Birth:    Age:  53  Pre-Arrival Type:  EMS  ETA:  01/16/2019 07:33:00 EDT  Primary Care Physician:    Presenting Problem:  abn body sensation  Pre-Arrival User:  Pleas Patricia, RN, Tomma Lightning H  Referring Source:    Location:  01            PreArrival Communication Form  Emergency Department        Additional Patient Information:        Orders:  [    ] CBC                                            [     ] CT Head no contrast  [    ] BMP                                           [     ] CT Abdomen/Pelvis no contrast  [    ] PT/INR                                       [     ] CT Abdomen/Pelvis IV contrast, w/ oral contrast  [    ] Troponin                                   [     ] CT Abdomen/Pelvis IV contrast, no oral contrast  [    ] BNP                                            [     ] See ordersheet  [    ] CXR                                             [     ] Other:__________________________  [    ] EKG

## 2019-01-16 NOTE — ED Notes (Signed)
 ED Patient Summary       ;       [facility] Emergency Department  [address] [place] [address]  [phone]  Discharge Instructions (Patient)  _______________________________________     Name: Kaitlin Porter, Kaitlin Porter  DOB: [birth year]                   MRN: [id number]                   FIN: [id number]%>[phone]  Reason For Visit: Psychiatric screening exam; Medical screening exam; ABNORMAL BODY SENSATION  Final Diagnosis: Constipation; Paresthesias     Visit Date: 01/16/2019 07:10:00  Address: [address] [place] [address]  Phone: [phone]     Primary Care Provider:      Name: MELISSA SAVANT      Phone: [phone]        Emergency Department Providers:         Primary Physician:            [name] would like to thank you for allowing us  to assist you with your healthcare needs. The following includes patient education materials and information regarding your injury/illness.     Follow-up Instructions: You were treated today on an emergency basis, it may be wise to contact your primary care provider to notify them of your visit today. You may have been referred to your regular doctor or a specialist, please follow up as instructed. If your condition worsens or you can't get in to see the doctor, contact the Emergency Department.              With: Address: When:   Follow up with primary care provider  Within 1 week   Comments:   Take over the counter miralax for constipation   Follow up with your primary care doctor, call today for appointment   Return to the emergency department for any new, worsening or concerning symptoms                Printed Prescriptions:    Patient Education Materials:  Discharge Orders          Discharge Patient 01/16/19 9:27:00 EDT         Comment:      Paresthesia, Easy-to-Read     Paresthesia    Paresthesia is a burning or prickling feeling. This feeling can happen in any part of the body. It often happens in the hands, arms, legs, or feet. Usually,  it is not painful. In most cases, the feeling goes away in a short time and is not a sign of a serious problem.      HOME CARE     Avoid drinking alcohol.     Try massage or needle therapy (acupuncture) to help with your problems.     Keep all follow-up visits as told by your doctor. This is important.    GET HELP IF:     You keep on having episodes of paresthesia.     Your burning or prickling feeling gets worse when you walk.     You have pain or cramps.     You feel dizzy.     You have a rash.    GET HELP RIGHT AWAY IF:     You feel weak.     You have trouble walking or moving.     You have problems speaking, understanding, or seeing.     You feel  confused.     You cannot control when you pee (urinate) or poop (bowel movement).     You lose feeling (numbness) after an injury.     You pass out (faint).    This information is not intended to replace advice given to you by your health care provider. Make sure you discuss any questions you have with your health care provider.    Document Released: 08/31/2008 Document Revised: 02/02/2015 Document Reviewed: 09/14/2014  Elsevier Interactive Patient Education ?2016 [employer].         Allergy Info: Lipitor     Medication Information:  [facility] provided you with a complete list of medications post discharge, if you have been instructed to stop taking a medication please ensure you also follow up with this information to your Primary Care Physician.  Unless otherwise noted, patient will continue to take medications as prescribed prior to the Emergency Room visit.  Any specific questions regarding your chronic medications and dosages should be discussed with your physician(s) and pharmacist.          acetaminophen (Tylenol Extra Strength 500 mg oral tablet) Oral (given by mouth) every 6 hours.  atenolol (atenolol 100 mg oral tablet) 1 Tabs Oral (given by mouth) every day.  cyclobenzaprine (cyclobenzaprine 10 mg oral tablet) 1 Tabs Oral (given by  mouth) 3 times a day as needed.,  THIS MEDICATION IS ASSOCIATED  WITH  AN INCREASED RISK OF FALLS.  dicyclomine (Bentyl 20 mg oral tablet) 1 Tabs Oral (given by mouth) 4 times a day as needed mild pain (1-3) for 5 Days. Refills: 0., for abdominal pain  fluticasone nasal (fluticasone 50 mcg/inh nasal spray) 1 Sprays Nasal (into the nose) 2 times a day.  HYDROcodone-acetaminophen (HYDROcodone-acetaminophen 5-325 EmergiScript)  levothyroxine (levothyroxine 75 mcg (0.075 mg) oral tablet) 1 Tabs Oral (given by mouth) every day.  lisinopril (lisinopril 40 mg oral tablet) 1 Tabs Oral (given by mouth) every day.  meloxicam (meloxicam 7.5 mg oral tablet) 1 Tabs Oral (given by mouth) every day.  meloxicam (meloxicam 7.5 mg oral tablet) 1 Tabs Oral (given by mouth) every day as needed other (see comment). pain. Refills: 0.  omeprazole (PriLOSEC 20 mg oral delayed release capsule) 1 Capsules Oral (given by mouth) every day. Refills: 0.  ondansetron (ondansetron 4 mg oral tablet) 1 Tabs Oral (given by mouth) every 6 hours as needed as needed for nausea/vomiting for 3 Days. Refills: 0.  oxybutynin (oxybutynin 5 mg/24 hours oral tablet, extended release) 5 Milligram Oral (given by mouth) every day.  polyethylene glycol 3350 (MiraLax oral powder for reconstitution) 17 Gram Oral (given by mouth) every day for 14 Days. Refills: 0.  simvastatin (simvastatin 20 mg oral tablet) 1 Tabs Oral (given by mouth) Once a Day (at bedtime).      Medications Administered During Visit:              Medication Dose Route   lorazepam 1 mg Oral          Major Tests and Procedures:  The following procedures and tests were performed during your ED visit.  COMMON PROCEDURES%>  COMMON PROCEDURES COMMENTS%>          Laboratory Orders  No laboratory orders were placed.              Radiology Orders  Name Status Details   XR Abdomen 2 Views Completed 01/16/19 8:46:00 EDT, STAT 1 hour or less, Reason: Constipation, Transport Mode: STRETCHER,  pp_set_radiology_subspecialty  Patient Care Orders  Name Status Details   Discharge Patient Ordered 01/16/19 9:27:00 EDT   ED Assessment Adult Completed 01/16/19 7:14:34 EDT, 01/16/19 7:14:34 EDT   ED Secondary Triage Completed 01/16/19 7:14:34 EDT, 01/16/19 7:14:34 EDT   ED Triage Adult Completed 01/16/19 7:10:11 EDT, 01/16/19 7:10:11 EDT       ---------------------------------------------------------------------------------------------------------------------  [facility] allows you to manage your health, view your test results, and retrieve your discharge documents from your hospital stay securely and conveniently from your computer.     To begin the enrollment process, visit [email or web address] Click on "Sign up now" under [facility].   Comment:

## 2019-01-16 NOTE — ED Provider Notes (Signed)
General Medical Problem *ED        Patient:   Kaitlin Porter, Kaitlin Porter             MRN: [id number]            FIN: [phone]               Age:   [age] years     Sex:  Female     DOB:  1966/08/17   Associated Diagnoses:   Paresthesias; Constipation   Author:   Anfernee Peschke-PA-C,  Tyeesha Riker N      Basic Information   Time seen: Provider Seen (ST)   ED Provider/Time:    Braelynn Lupton-PA-C,  Verenis Nicosia N / 01/16/2019 07:17  .   Additional information: Chief Complaint from Nursing Triage Note   Chief Complaint  Chief Complaint: patient from home where she was brought in by EMS stating she feels bugs are crawling on her.  No bugs seen on triage or by EMS. patient states it feels like the bugs are on the inside.  going on for a couple days.  BGL 127 by ems (01/16/19 07:10:00).      History of Present Illness   Pt is a 53 y/o f with PMHx of HTN presents to the ED via EMS with c/o abnormal body sensation. Pt states that for the past two days she has had the sensation of "bugs on the inside", describes as a pulsing that begins in her legs and goes all the way up to her head. She denies any rash, pain, itching. Denies having actual bugs on her skin. Denies any insect bites. Denies SI/HI. There are no other complaints at this time. She denies any psychiatric history or having a psych/mental health provider..        Review of Systems   Constitutional symptoms:  No fever,    Skin symptoms:  Negative except as documented in HPI, No rash,    ENMT symptoms:  No sore throat,    Respiratory symptoms:  No cough,    Cardiovascular symptoms:  No chest pain,    Gastrointestinal symptoms:  No abdominal pain,    Genitourinary symptoms:  No dysuria,    Musculoskeletal symptoms:  No back pain,    Neurologic symptoms:  No headache,              Additional review of systems information: All other systems reviewed and otherwise negative.      Health Status   Allergies:    Allergic Reactions (Selected)  Mild  Lipitor- No reactions were documented..      Past Medical/ Family/  Social History   Medical history:    No active or resolved past medical history items have been selected or recorded., Reviewed as documented in chart.   Surgical history:    VP shunt ([id number]).  Cholecystectomy ([id number])., Reviewed as documented in chart.   Family history: Reviewed as documented in chart.   Social history: Reviewed as documented in chart.   Problem list:    Active Problems (3)  HTN   Hypercholesteremia   Hypothyroid   , per nurse's notes.      Physical Examination               Vital Signs   Vital Signs   01/16/2019 7:10 EDT Systolic Blood Pressure 152 mmHg  HI    Diastolic Blood Pressure 89 mmHg    Temperature Oral 36.7 degC    Heart Rate Monitored 80 bpm    Respiratory  Rate 15 br/min    SpO2 100 %    Measurements   01/16/2019 7:14 EDT Body Mass Index est meas 32.27 kg/m2   01/16/2019 7:14 EDT Body Mass Index Measured 32.27 kg/m2   01/16/2019 7:10 EDT Height/Length Measured 167 cm    Weight Dosing 90 kg    General:  Alert, no acute distress.    Skin:  Warm, dry.    Head:  Normocephalic, atraumatic.    Neck:  Supple.   Eye:  Pupils are equal, round and reactive to light.   Ears, nose, mouth and throat:  Oral mucosa moist.   Cardiovascular:  Regular rate and rhythm, No murmur, Normal peripheral perfusion.    Respiratory:  Lungs are clear to auscultation, respirations are non-labored, breath sounds are equal, Symmetrical chest wall expansion.    Gastrointestinal:  Soft, Nontender.    Back:  Nontender, Normal range of motion.    Musculoskeletal:  Normal ROM.   Neurological:  Alert and oriented to person, place, time, and situation, No focal neurological deficit observed.    Psychiatric:  Cooperative, appropriate mood & affect, normal judgment, non-suicidal.       Medical Decision Making   Differential Diagnosis:  paresthesias, insect bites, anxiety.   Documents reviewed:  Emergency department nurses' notes.   Radiology results:  Rad Results (ST)   XR Abdomen 2 Views  ?  01/16/19 09:21:27  Indication:  Constipation    Comparison: CT 01/04/2019    Supine and erect radiographs of the abdomen, 01/16/19    Findings:  Scattered bowel gas noted within normal caliber loops of large and small bowel.  Mild fecal loading. Cholecystectomy clips. Spinal catheter terminating in the  right subcutaneous tissues unchanged. No pneumatosis, portal venous gas or  pneumoperitoneum. Regional skeleton unremarkable.    Impression:  No radiographic evidence for adynamic ileus or obstruction.      If you have questions regarding this report, please feel free to call me  directly using [employer].  ?  Signed By: Maurilio Lovely, JEFFERY  .   Notes:  Pt in the ED via EMS with complaints of feeling like there are insects inside of her. Pt is awake, alert, oriented. She is answering questions appropriately. She is not suicidal or homicidal. Pt has no rash, bites or skin abnormalities. Pt moving all four extremities without difficulty. Medical screening exam does not reveal any acute abnormality to warrant work up or admission. Pt without acute psychosis, delusions, or reason to be held against her will. Will tx with ativan and dc to follow up PCP. Patient is in agreement with this plan of care. They express understanding and have no further questions at this time. Patient is afebrile, nontoxic appearing and in no acute distress. Discharged to home in stable condition.    2045 at time of discharge pt requesting repeat evaluation of her legs, nurse reassured her that there was no rash or abnormalities. pt then requested evaluation of abd, she c/o bloating and "rumbling". I reentered the room and palpated the pt, she was nontender and nondistended, but requested I "look inside" of her abd. I attempted to reassure the pt that it was likely constipation since she hasn't had BM in some time, and she was recently seen for same and had workup which revealed constipation, however she became insistent therefore xray flat and upright ordered.    xray normal will  dc to home in stable condition recommended daily miralax.      Impression and Plan  Diagnosis   Paresthesias (ICD10-CM R20.2, Discharge, Medical)   Constipation (ICD10-CM K59.00, Discharge, Medical)   Plan   Condition: Improved.    Disposition: Discharged: Time  01/16/2019 07:56:00, to home.    Patient was given the following educational materials: Paresthesia, Easy-to-Read, Paresthesia, Easy-to-Read.    Follow up with: Follow up with primary care provider Within 1 week Follow up with your primary care doctor, call today for appointment   Return to the emergency department for any new, worsening or concerning symptoms  , Follow up with primary care provider Within 1 week Take over the counter miralax for constipation  Follow up with your primary care doctor, call today for appointment   Return to the emergency department for any new, worsening or concerning symptoms  .    Counseled: I had a detailed discussion with the patient and/or guardian regarding the historical points/exam findings supporting the discharge diagnosis and need for outpatient followup. Discussed the need to return to the ER if symptoms persist/worsen, or for any questions/concerns that arise at home.    Signature Line     Electronically Signed on 01/16/2019 10:37 AM EDT   ________________________________________________   Vergia Alberts      Electronically Signed on 01/16/2019 01:39 PM EDT   ________________________________________________   Lynn Ito V            Modified by: Brandley Aldrete-PA-C,  Loriene Taunton N on 01/16/2019 08:48 AM EDT      Modified by: Dimitri Shakespeare-PA-C,  Cale Decarolis N on 01/16/2019 09:27 AM EDT      Modified by: Anetta Olvera-PA-C,  Sukari Grist N on 01/16/2019 10:37 AM EDT

## 2019-01-16 NOTE — Discharge Summary (Signed)
 ED Clinical Summary                     [facility]  [address]  [place], [place] [address]  [phone]          PERSON INFORMATION  Name: Kaitlin Porter, IHNEN Age:  53 Years DOB: 21-Mar-1966   Sex: Female Language: English PCP: MELISSA SAVANT   Marital Status: Single Phone: [phone] Med Service: MED-Medicine   MRN: [id number] Acct# [id number] Arrival: 01/16/2019 07:10:00   Visit Reason: Psychiatric screening exam; Medical screening exam; ABNORMAL BODY SENSATION Acuity: 4 LOS: 000 02:24   Address:    [address] [place] [address]   Diagnosis:    Constipation; Paresthesias  Medications:          Medications that have not changed  Other Medications  acetaminophen (Tylenol Extra Strength 500 mg oral tablet) Oral (given by mouth) every 6 hours.  Last Dose:____________________  atenolol (atenolol 100 mg oral tablet) 1 Tabs Oral (given by mouth) every day.  Last Dose:____________________  cyclobenzaprine (cyclobenzaprine 10 mg oral tablet) 1 Tabs Oral (given by mouth) 3 times a day as needed.,  THIS MEDICATION IS ASSOCIATED  WITH  AN INCREASED RISK OF FALLS.  Last Dose:____________________  dicyclomine (Bentyl 20 mg oral tablet) 1 Tabs Oral (given by mouth) 4 times a day as needed mild pain (1-3) for 5 Days. Refills: 0., for abdominal pain  Last Dose:____________________  fluticasone nasal (fluticasone 50 mcg/inh nasal spray) 1 Sprays Nasal (into the nose) 2 times a day.  Last Dose:____________________  HYDROcodone-acetaminophen (HYDROcodone-acetaminophen 5-325 EmergiScript)   Last Dose:____________________  levothyroxine (levothyroxine 75 mcg (0.075 mg) oral tablet) 1 Tabs Oral (given by mouth) every day.  Last Dose:____________________  lisinopril (lisinopril 40 mg oral tablet) 1 Tabs Oral (given by mouth) every day.  Last Dose:____________________  meloxicam (meloxicam 7.5 mg oral tablet) 1 Tabs Oral (given by mouth) every day.  Last Dose:____________________  meloxicam  (meloxicam 7.5 mg oral tablet) 1 Tabs Oral (given by mouth) every day as needed other (see comment). pain. Refills: 0.  Last Dose:____________________  omeprazole (PriLOSEC 20 mg oral delayed release capsule) 1 Capsules Oral (given by mouth) every day. Refills: 0.  Last Dose:____________________  ondansetron (ondansetron 4 mg oral tablet) 1 Tabs Oral (given by mouth) every 6 hours as needed as needed for nausea/vomiting for 3 Days. Refills: 0.  Last Dose:____________________  oxybutynin (oxybutynin 5 mg/24 hours oral tablet, extended release) 5 Milligram Oral (given by mouth) every day.  Last Dose:____________________  polyethylene glycol 3350 (MiraLax oral powder for reconstitution) 17 Gram Oral (given by mouth) every day for 14 Days. Refills: 0.  Last Dose:____________________  simvastatin (simvastatin 20 mg oral tablet) 1 Tabs Oral (given by mouth) Once a Day (at bedtime).  Last Dose:____________________      Medications Administered During Visit:                Medication Dose Route   lorazepam 1 mg Oral               Allergies      Lipitor      Major Tests and Procedures:  The following procedures and tests were performed during your ED visit.  COMMON PROCEDURES%>  COMMON PROCEDURES COMMENTS%>                PROVIDER INFORMATION               Provider Role  Assigned Sampson LONITA POWELL LOISE ED MidLevel 01/16/2019 07:17:19    Hollice RN, Reyes ED Nurse 01/16/2019 07:42:39        Attending Physician:  MAFFEO-PA-C,  HEATHER N      Admit Doc  MAFFEO-PA-C,  HEATHER N     Consulting Doc       VITALS INFORMATION  Vital Sign Triage Latest   Temp Oral ORAL_1%> ORAL%>   Temp Temporal TEMPORAL_1%> TEMPORAL%>   Temp Intravascular INTRAVASCULAR_1%> INTRAVASCULAR%>   Temp Axillary AXILLARY_1%> AXILLARY%>   Temp Rectal RECTAL_1%> RECTAL%>   02 Sat 100 % 100 %   Respiratory Rate RATE_1%> RATE%>   Peripheral Pulse Rate PULSE RATE_1%> PULSE RATE%>   Apical Heart Rate HEART RATE_1%> HEART RATE%>   Blood Pressure BLOOD  PRESSURE_1%>/ BLOOD PRESSURE_1%>89 mmHg BLOOD PRESSURE%> / BLOOD PRESSURE%>85 mmHg                 Immunizations      No Immunizations Documented This Visit          DISCHARGE INFORMATION   Discharge Disposition: H Outpt-Sent Home   Discharge Location:  Home   Discharge Date and Time:  01/16/2019 09:34:25   ED Checkout Date and Time:  01/16/2019 09:34:25     DEPART REASON INCOMPLETE INFORMATION               Depart Action Incomplete Reason   Interactive View/I&O MD Decision to leave incomplete               Problems      Active           Hypothyroid          Hypercholesteremia          HTN              Smoking Status      Former smoker         PATIENT EDUCATION INFORMATION  Instructions:     Paresthesia, Easy-to-Read     Follow up:                   With: Address: When:   Follow up with primary care provider  Within 1 week   Comments:   Take over the counter miralax for constipation   Follow up with your primary care doctor, call today for appointment   Return to the emergency department for any new, worsening or concerning symptoms                ED PROVIDER DOCUMENTATION

## 2019-01-16 NOTE — ED Notes (Signed)
ED Patient Education Note     Patient Education Materials Follows:  Easy-to-Read     Paresthesia    Paresthesia is a burning or prickling feeling. This feeling can happen in any part of the body. It often happens in the hands, arms, legs, or feet. Usually, it is not painful. In most cases, the feeling goes away in a short time and is not a sign of a serious problem.      HOME CARE     Avoid drinking alcohol.     Try massage or needle therapy (acupuncture) to help with your problems.     Keep all follow-up visits as told by your doctor. This is important.    GET HELP IF:     You keep on having episodes of paresthesia.     Your burning or prickling feeling gets worse when you walk.     You have pain or cramps.     You feel dizzy.     You have a rash.    GET HELP RIGHT AWAY IF:     You feel weak.     You have trouble walking or moving.     You have problems speaking, understanding, or seeing.     You feel confused.     You cannot control when you pee (urinate) or poop (bowel movement).     You lose feeling (numbness) after an injury.     You pass out (faint).    This information is not intended to replace advice given to you by your health care provider. Make sure you discuss any questions you have with your health care provider.    Document Released: 08/31/2008 Document Revised: 02/02/2015 Document Reviewed: 09/14/2014  Elsevier Interactive Patient Education ?2016 Elsevier Inc.

## 2019-02-06 NOTE — ED Notes (Signed)
ED Triage Note       ED Triage Adult Entered On:  02/06/2019 19:05 EDT    Performed On:  02/06/2019 19:03 EDT by Jeanella FlatteryPLOTKIN, RN, DANIELLE K               Triage   Chief Complaint :   pt called ems reports R leg pain and L knee pain starting today hx of same. denies taking any pain reliever. ems reports pt ambulatory on scene. pt denies recent injury   Numeric Rating Pain Scale :   10 = Worst possible pain   [place] Mode of Arrival :   Ambulance   Infectious Disease Documentation :   Document assessment   Temperature Oral :   36.7 degC(Converted to: 98.1 degF)    Heart Rate Monitored :   90 bpm   Respiratory Rate :   17 br/min   Systolic Blood Pressure :   132 mmHg   Diastolic Blood Pressure :   90 mmHg   SpO2 :   99 %   Oxygen Therapy :   Room air   Patient presentation :   None of the above   Chief Complaint or Presentation suggest infection :   No   Weight Dosing :   104 kg(Converted to: 229 lb 4 oz)    Height :   167 cm(Converted to: 5 ft 6 in)    Body Mass Index Dosing :   37 kg/m2   PLOTKIN, RN, DANIELLE K - 02/06/2019 19:03 EDT   DCP GENERIC CODE   Tracking Acuity :   4   Tracking Group :   ED [id number] [facility] [employer]   Jeanella FlatteryLOTKIN, RJanetta Hora, DANIELLE K - 02/06/2019 19:03 EDT   ED General Section :   Document assessment   Pregnancy Status :   Patient denies   ED Allergies Section :   Document assessment   ED Reason for Visit Section :   Document assessment   ED Quick Assessment :   Patient appears awake, alert, oriented to baseline. Skin warm and dry. Moves all extremities. Respiration even and unlabored. Appears in no apparent distress.   PLOTKIN, RN, DANIELLE K - 02/06/2019 19:03 EDT   ID Risk Screen Symptoms   Recent Travel History :   No recent travel   Close Contact with COVID-19  ID :   No   Have you been tested for COVID-19 ID :   No   TB Symptom Screen :   No symptoms   C. diff Symptom/History ID :   Neither of the above   PLOTKIN, RN, DANIELLE K - 02/06/2019 19:03 EDT   Allergies   (As Of: 02/06/2019 19:05:37 EDT)   Allergies  (Active)   Lipitor  Estimated Onset Date:   Unspecified ; Created ByMarrion Coy:   ALICEA, RN, LARA J; Reaction Status:   Active ; Category:   Drug ; Substance:   Lipitor ; Type:   Allergy ; Severity:   Mild ; Updated By:   Marrion CoyALICEA, RN, Kathe BectonLARA J; Reviewed Date:   01/16/2019 7:11 EDT        Psycho-Social   Last 3 mo, thoughts killing self/others :   Patient denies   ED Behavioral Activity Rating Scale :   4 - Quiet and awake (normal level of activity)   Jeanella FlatteryLOTKIN, RN, Janetta HoraDANIELLE K - 02/06/2019 19:03 EDT   ED Reason for Visit   (As Of: 02/06/2019 19:05:37 EDT)   Problems(Active)    HTN (SNOMED CT  :  [id number] )  Name of Problem:   HTN ; Recorder:   CABE, RN, ADAM Z; Confirmation:   Confirmed ; Classification:   Medical ; Code:   [id number] ; Contributor System:   Dietitian ; Last Updated:   09/18/2016 13:23 EST ; Life Cycle Date:   09/18/2016 ; Life Cycle Status:   Active ; Vocabulary:   SNOMED CT        Hypercholesteremia (IMO  :02111 )  Name of Problem:   Hypercholesteremia ; Recorder:   CABE, RN, ADAM Z; Confirmation:   Confirmed ; Classification:   Medical ; Code:   55208 ; Contributor System:   PowerChart ; Last Updated:   09/18/2016 13:23 EST ; Life Cycle Date:   09/18/2016 ; Life Cycle Status:   Active ; Vocabulary:   IMO        Hypothyroid (IMO  :[id number] )  Name of Problem:   Hypothyroid ; Recorder:   MALONE, RN, JENNIFER L; Confirmation:   Confirmed ; Classification:   Medical ; Code:   [id number] ; Contributor System:   Dietitian ; Last Updated:   09/19/2016 14:07 EST ; Life Cycle Date:   09/19/2016 ; Life Cycle Status:   Active ; Vocabulary:   IMO          Diagnoses(Active)    Leg pain-swelling  Date:   02/06/2019 ; Diagnosis Type:   Reason For Visit ; Confirmation:   Complaint of ; Clinical Dx:   Leg pain-swelling ; Classification:   Medical ; Clinical Service:   Emergency medicine ; Code:   PNED ; Probability:   0 ; Diagnosis Code:   [id number]

## 2019-02-06 NOTE — ED Provider Notes (Signed)
Leg pain-swelling        Patient:   Kaitlin Porter, Kaitlin Porter             MRN: [id number]            FIN: [phone]               Age:   53 years     Sex:  Female     DOB:  [birth year]   Associated Diagnoses:   Strain of right quadriceps; Left knee injury   Author:   Rozelle Logan      Basic Information   Time seen: Date & time 02/06/2019 19:25:00.   History source: Patient.   Arrival mode: Private vehicle.   History limitation: None.   Additional information: Chief Complaint from Nursing Triage Note   Chief Complaint  Chief Complaint: pt called ems reports R leg pain and L knee pain starting today hx of same. denies taking any pain reliever. ems reports pt ambulatory on scene. pt denies recent injury (02/06/19 19:03:00).      History of Present Illness   The patient presents with lower extremity pain and knee pain.  The course/duration of symptoms is constant.  The character of symptoms is pain.  The degree at present is moderate.  There are exacerbating factors including movement, weight bearing and walking.  Associated symptoms: none.  Patient with h/o chronic right thigh pain and occasionally has exacerbation of her pain when walking or standing for prolonged periods of time.  Denies being evaluated for this pain in the past.  Also reports a fall onto her left knee while at home last night.  Denies head injury or LOC.  She states going to a store today, then developed right thigh and left knee pain while walking around.  She called EMS to bring her here for evaluation..        Review of Systems   Constitutional symptoms:  Negative except as documented in HPI.   Skin symptoms:  Negative except as documented in HPI.   Respiratory symptoms:  Negative except as documented in HPI.   Gastrointestinal symptoms:  Negative except as documented in HPI.   Musculoskeletal symptoms:  Muscle pain, Joint pain.    Neurologic symptoms:  Negative except as documented in HPI.   Psychiatric symptoms:  Negative except as documented in HPI.              Additional review of systems information: All other systems reviewed and otherwise negative.      Health Status   Allergies:    Allergic Reactions (Selected)  Mild  Lipitor- No reactions were documented..   Medications:  (Selected)   Prescriptions  Prescribed  Bentyl 20 mg oral tablet: 20 mg, 1 tabs, Oral, QID, for 5 days, PRN: mild pain (1-3), 20 tabs, 0 Refill(s)  PriLOSEC 20 mg oral delayed release capsule: 20 mg, 1 caps, Oral, Daily, 30 caps, 0 Refill(s)  meloxicam 7.5 mg oral tablet: 7.5 mg, 1 tabs, Oral, Daily, pain, PRN: other (see comment), 30 tabs, 0 Refill(s)  ondansetron 4 mg oral tablet: 4 mg, 1 tabs, Oral, q6hr, for 3 days, PRN: as needed for nausea/vomiting, 20 tabs, 0 Refill(s)  Documented Medications  Documented  Tylenol Extra Strength 500 mg oral tablet: mg, tabs, Oral, q6hr, 0 Refill(s)  atenolol 100 mg oral tablet: 100 mg, 1 tabs, Oral, Daily, 30 tabs, 0 Refill(s)  cyclobenzaprine 10 mg oral tablet: 10 mg, 1 tabs, Oral, TID, PRN, 30 tabs, 0 Refill(s)  fluticasone 50 mcg/inh nasal spray: 1 sprays, Nasal, BID, 16 g, 0 Refill(s)  levothyroxine 75 mcg (0.075 mg) oral tablet: 75 mcg, 1 tabs, Oral, Daily, 30 tabs, 0 Refill(s)  lisinopril 40 mg oral tablet: 40 mg, 1 tabs, Oral, Daily, 30 tabs, 0 Refill(s)  oxybutynin 5 mg/24 hours oral tablet, extended release: 5 mg, 1 tabs, Oral, Daily, 30 tabs, 0 Refill(s)  simvastatin 20 mg oral tablet: 20 mg, 1 tabs, Oral, Once a Day (at bedtime), 0 Refill(s).      Past Medical/ Family/ Social History   Medical history: Reviewed as documented in chart.   Surgical history: Reviewed as documented in chart.   Family history: Not significant.   Social history: Reviewed as documented in chart.   Problem list:    Active Problems (3)  HTN   Hypercholesteremia   Hypothyroid   .      Physical Examination               Vital Signs   Vital Signs   02/06/2019 19:03 EDT Systolic Blood Pressure 132 mmHg    Diastolic Blood Pressure 90 mmHg    Temperature Oral 36.7 degC     Heart Rate Monitored 90 bpm    Respiratory Rate 17 br/min    SpO2 99 %    Measurements   02/06/2019 19:05 EDT Body Mass Index est meas 37.29 kg/m2    Body Mass Index Measured 37.29 kg/m2   02/06/2019 19:03 EDT Height/Length Measured 167 cm    Weight Dosing 104 kg    Basic Oxygen Information   02/06/2019 19:03 EDT SpO2 99 %    Oxygen Therapy Room air    General:  Alert, no acute distress.    Skin:  Warm, intact.    Head:  Normocephalic, atraumatic.    Respiratory:  Respirations are non-labored.   Musculoskeletal:  Lower extremity: Right, anterior, thigh, and left anterior knee, tenderness to palpation, no swelling, and ROM limited secondary to pain.Marland Kitchen.   Neurological:  Normal speech observed, Cognitive function: Oriented x 3, to person, to place, to time.    Psychiatric:  Cooperative.      Medical Decision Making   Differential Diagnosis:  Contusion, sprain, strain, internal knee injury.    Documents reviewed:  Emergency department nurses' notes.   Radiology results:  Rad Results (ST)   XR Tibia/Fibula Left  ?  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 The components demonstrate no evidence of loosening. Enthesopathy  of the superior aspect of the patella present. The tibia and fibula appear  intact. Arthritic changes at the level the ankle present. Edema present within  the knee and calf region. Mineralization is normal.  ?  Signed By: Burt EkWELLS-MD, JAMES D  ?  **************************************************  XR Femur Right  ?  02/06/19 19:53:50  XR Femur Right: AP and lateral 02/06/19    COMPARISON: None    INDICATION: Pain, arm or leg    FINDINGS/IMPRESSION: Overlying panniculus limits assessment of the femoral head  and neck. Enthesopathy of the greater trochanteric region present. The mid and  distal femur appear intact. Arthritic changes at the level of the  knee joint  noted. If the patient has pain in the right hip joint region, additional images  could be helpful.  ?  Signed By: Burt EkWELLS-MD, JAMES D  .  Reexamination/ Reevaluation   Vital signs   Basic Oxygen Information   02/06/2019 19:03 EDT SpO2 99 %    Oxygen Therapy Room air      Course: unchanged.   Pain status: unchanged.   Assessment: exam unchanged.      Impression and Plan   Diagnosis   Strain of right quadriceps (ICD10-CM S76.111A, Discharge, Medical)   Left knee injury (ICD10-CM S89.92XA, Discharge, Medical)   Plan   Condition: Stable.    Disposition: Medically cleared, Discharged: Time  02/06/2019 20:06:00, to home.    Prescriptions: Launch prescriptions   Pharmacy:  Lidoderm 5% topical film (Prescribe): 1 patches, Topical, Daily, for 10 days, remove patches after 12 hours, 10 patches, 0 Refill(s)  ibuprofen 800 mg oral tablet (Prescribe): 800 mg, 1 tabs, Oral, q8hr, for 5 days, PRN: moderate pain (4-7), 15 tabs, 0 Refill(s).    Patient was given the following educational materials: Muscle Strain, Knee Pain.    Follow up with: Follow-up with your orthopedist Within 3 to 5 days Return to ED if symptoms worsen.    Counseled: Patient, Regarding diagnosis, Regarding diagnostic results, Regarding treatment plan, Regarding prescription, Patient indicated understanding of instructions.    Signature Line     Electronically Signed on 02/06/2019 08:08 PM EDT   ________________________________________________   Rozelle Logan      Electronically Signed on 02/07/2019 12:30 AM EDT   ________________________________________________   COOK-MD,  DAVID Maisie Fus            Modified by: Rozelle Logan on 02/06/2019 08:08 PM EDT

## 2019-02-06 NOTE — ED Notes (Signed)
 ED Patient Summary       ;       [facility] Emergency Department  [address], [place] [address]  [phone]  Discharge Instructions (Patient)  _______________________________________     Name: Kaitlin Porter, Kaitlin Porter  DOB: [birth year]                   MRN: [id number]                   FIN: [phone]  Reason For Visit: Leg pain-swelling; LEG PAIN/EMS  Final Diagnosis: Left knee injury; Strain of right quadriceps     Visit Date: 02/06/2019 18:56:00  Address: [address] [place] [address]  Phone: [phone]     Primary Care Provider:      Name: MELISSA SAVANT      Phone: [phone]        Emergency Department Providers:         Primary Physician:            [name] would like to thank you for allowing us  to assist you with your healthcare needs. The following includes patient education materials and information regarding your injury/illness.     Follow-up Instructions: You were treated today on an emergency basis, it may be wise to contact your primary care provider to notify them of your visit today. You may have been referred to your regular doctor or a specialist, please follow up as instructed. If your condition worsens or you can't get in to see the doctor, contact the Emergency Department.              With: Address: When:   Follow-up with your orthopedist  Within 3 to 5 days   Comments:   Return to ED if symptoms worsen              Printed Prescriptions:    Patient Education Materials:  Discharge Orders          Discharge Patient 02/06/19 20:08:00 EDT         Comment:      Knee Pain; Muscle Strain     Knee Pain    Knee pain is a very common symptom and can have many causes. Knee pain often goes away when you follow your health care provider's instructions for relieving pain and discomfort at home. However, knee pain can develop into a condition that needs treatment. Some conditions may include:       Arthritis caused by wear and tear (osteoarthritis).      Arthritis caused by swelling and irritation (rheumatoid arthritis or gout).     A cyst or growth in your knee.     An infection in your knee joint.     An injury that will not heal.     Damage, swelling, or irritation of the tissues that support your knee (torn ligaments or tendinitis).    If your knee pain continues, additional tests may be ordered to diagnose your condition. Tests may include X-rays or other imaging studies of your knee. You may also need to have fluid removed from your knee. Treatment for ongoing knee pain depends on the cause, but treatment may include:     Medicines to relieve pain or swelling.     Steroid injections in your knee.     Physical therapy.     Surgery.    HOME CARE INSTRUCTIONS     Take medicines only as directed  by your health care provider.      Rest your knee and keep it raised (elevated) while you are resting.     Do not do things that cause or worsen pain.     Avoid high-impact activities or exercises, such as running, jumping rope, or doing jumping jacks.     Apply ice to the knee area:    ? Put ice in a plastic bag.    ? Place a towel between your skin and the bag.    ? Leave the ice on for 20 minutes, 2?3 times a day.     Ask your health care provider if you should wear an elastic knee support.     Keep a pillow under your knee when you sleep.     Lose weight if you are overweight. Extra weight can put pressure on your knee.     Do not use any tobacco products, including cigarettes, chewing tobacco, or electronic cigarettes. If you need help quitting, ask your health care provider. Smoking may slow the healing of any bone and joint problems that you may have.    SEEK MEDICAL CARE IF:     Your knee pain continues, changes, or gets worse.     You have a fever along with knee pain.     Your knee buckles or locks up.     Your knee becomes more swollen.    SEEK IMMEDIATE MEDICAL CARE IF:     Your knee joint feels hot to the touch.     You have chest pain or trouble breathing.     This information is not intended to replace advice given to you by your health care provider. Make sure you discuss any questions you have with your health care provider.    Document Released: 07/16/2007 Document Revised: 10/09/2014 Document Reviewed: 05/04/2014  Elsevier Interactive Patient Education ?2016 [employer]       Muscle Strain    A muscle strain is an injury that occurs when a muscle is stretched beyond its normal length. Usually a small number of muscle fibers are torn when this happens. Muscle strain is rated in degrees. First-degree strains have the least amount of muscle fiber tearing and pain. Second-degree and third-degree strains have increasingly more tearing and pain.     Usually, recovery from muscle strain takes 1?2 weeks. Complete healing takes 5?6 weeks.       CAUSES    Muscle strain happens when a sudden, violent force placed on a muscle stretches it too far. This may occur with lifting, sports, or a fall.     RISK FACTORS    Muscle strain is especially common in athletes.     SIGNS AND SYMPTOMS    At the site of the muscle strain, there may be:     Pain.     Bruising.     Swelling.      Difficulty using the muscle due to pain or lack of normal function.     DIAGNOSIS    Your health care provider will perform a physical exam and ask about your medical history.    TREATMENT    Often, the best treatment for a muscle strain is resting, icing, and applying cold compresses to the injured area. ?    HOME CARE INSTRUCTIONS     Use the PRICE method of treatment to promote muscle healing during the first 2?3 days after your injury. The PRICE method involves:    ? Protecting  the muscle from being injured again.    ? Restricting your activity and resting the injured body part.     ? Icing your injury. To do this, put ice in a plastic bag. Place a towel between your skin and the bag. Then, apply the ice and leave it on from 15?20 minutes each hour. After the third day, switch to moist heat packs.      ? Apply compression to the injured area with a splint or elastic bandage. Be careful not to wrap it too tightly. This may interfere with blood circulation or increase swelling.     ? Elevate the injured body part above the level of your heart as often as you can.     Only take over-the-counter or prescription medicines for pain, discomfort, or fever as directed by your health care provider.      Warming up prior to exercise helps to prevent future muscle strains.     SEEK MEDICAL CARE IF:     You have increasing pain or swelling in the injured area.     You have numbness, tingling, or a significant loss of strength in the injured area.     MAKE SURE YOU:     Understand these instructions.      Will watch your condition.     Will get help right away if you are not doing well or get worse.    This information is not intended to replace advice given to you by your health care provider. Make sure you discuss any questions you have with your health care provider.    Document Released: 09/18/2005 Document Revised: 07/09/2013 Document Reviewed: 04/17/2013  Elsevier Interactive Patient Education ?2016 Elsevier Inc.         Allergy Info: Lipitor     Medication Information:  [facility] provided you with a complete list of medications post discharge, if you have been instructed to stop taking a medication please ensure you also follow up with this information to your Primary Care Physician.  Unless otherwise noted, patient will continue to take medications as prescribed prior to the Emergency Room visit.  Any specific questions regarding your chronic medications and dosages should be discussed with your physician(s) and pharmacist.          acetaminophen (Tylenol Extra Strength 500 mg oral tablet) Oral (given by mouth) every 6 hours.  atenolol (atenolol 100 mg oral tablet) 1 Tabs Oral (given by mouth) every day.  cyclobenzaprine (cyclobenzaprine 10 mg oral tablet) 1 Tabs Oral (given by mouth) 3 times a  day as needed.,  THIS MEDICATION IS ASSOCIATED  WITH  AN INCREASED RISK OF FALLS.  dicyclomine (Bentyl 20 mg oral tablet) 1 Tabs Oral (given by mouth) 4 times a day as needed mild pain (1-3) for 5 Days. Refills: 0., for abdominal pain  fluticasone nasal (fluticasone 50 mcg/inh nasal spray) 1 Sprays Nasal (into the nose) 2 times a day.  ibuprofen (ibuprofen 800 mg oral tablet) 1 Tabs Oral (given by mouth) every 8 hours as needed moderate pain (4-7) for 5 Days. Refills: 0.  levothyroxine (levothyroxine 75 mcg (0.075 mg) oral tablet) 1 Tabs Oral (given by mouth) every day.  lidocaine topical (Lidoderm 5% topical film) 1 Patches Topical (on the skin) every day for 10 Days. remove patches after 12 hours. Refills: 0.  lisinopril (lisinopril 40 mg oral tablet) 1 Tabs Oral (given by mouth) every day.  meloxicam (meloxicam 7.5 mg oral tablet) 1 Tabs Oral (  given by mouth) every day as needed other (see comment). pain. Refills: 0.  omeprazole (PriLOSEC 20 mg oral delayed release capsule) 1 Capsules Oral (given by mouth) every day. Refills: 0.  ondansetron (ondansetron 4 mg oral tablet) 1 Tabs Oral (given by mouth) every 6 hours as needed as needed for nausea/vomiting for 3 Days. Refills: 0.  oxybutynin (oxybutynin 5 mg/24 hours oral tablet, extended release) 5 Milligram Oral (given by mouth) every day.  simvastatin (simvastatin 20 mg oral tablet) 1 Tabs Oral (given by mouth) Once a Day (at bedtime).      Medications Administered During Visit:              Medication Dose Route   ibuprofen 800 mg Oral          Major Tests and Procedures:  The following procedures and tests were performed during your ED visit.  COMMON PROCEDURES%>  COMMON PROCEDURES COMMENTS%>          Laboratory Orders  No laboratory orders were placed.              Radiology Orders  Name Status Details   XR Femur Right Completed 02/06/19 19:20:00 EDT, STAT 1 hour or less, Reason: Pain, arm or leg, Transport Mode: STRETCHER, [email or web address]_[email or web address]_subspecialty   XR  Tibia/Fibula Left Completed 02/06/19 19:20:00 EDT, STAT 1 hour or less, Reason: Injury, knee & below, Transport Mode: STRETCHER, pp_set_radiology_subspecialty               Patient Care Orders  Name Status Details   Discharge Patient Ordered 02/06/19 20:08:00 EDT   ED Assessment Adult Completed 02/06/19 19:05:38 EDT, 02/06/19 19:05:38 EDT   ED Secondary Triage Completed 02/06/19 19:05:38 EDT, 02/06/19 19:05:38 EDT   ED Triage Adult Completed 02/06/19 18:56:24 EDT, 02/06/19 18:56:24 EDT   Patient Specific Fall Safety Measures Completed 02/06/19 19:07:02 EDT, Once, 02/06/19 19:07:02 EDT, ED Low Fall Risk Documentation       ---------------------------------------------------------------------------------------------------------------------  [facility] allows you to manage your health, view your test results, and retrieve your discharge documents from your hospital stay securely and conveniently from your computer.     To begin the enrollment process, visit [email or web address] Click on "Sign up now" under [facility].   Comment:

## 2019-02-06 NOTE — ED Notes (Signed)
ED Pre-Arrival Note        Pre-Arrival Summary    Name:  CC 61,    Current Date:  02/06/2019 19:00:58 EDT  Gender:  Female  Date of Birth:    Age:  53 F  Pre-Arrival Type:  EMS  ETA:  02/06/2019 19:15:00 EDT  Primary Care Physician:    Presenting Problem:  Leg swelling/pain  Pre-Arrival User:  Daphine Deutscher RN, Apolinar Junes  Referring Source:    Location:  [facility]            PreArrival Communication Form  Emergency Department        Additional Patient Information:        Orders:  [    ] CBC                                            [     ] CT Head no contrast  [    ] BMP                                           [     ] CT Abdomen/Pelvis no contrast  [    ] PT/INR                                       [     ] CT Abdomen/Pelvis IV contrast, w/ oral contrast  [    ] Troponin                                   [     ] CT Abdomen/Pelvis IV contrast, no oral contrast  [    ] BNP                                            [     ] See ordersheet  [    ] CXR                                             [     ] Other:__________________________  [    ] EKG

## 2019-02-06 NOTE — Discharge Summary (Signed)
 ED Clinical Summary                     [facility]  [address]  [place], [place] [address]  [phone]          PERSON INFORMATION  Name: Kaitlin Porter, Kaitlin Porter Age:  53 Years DOB: March 23, 1966   Sex: Female Language: English PCP: MELISSA SAVANT   Marital Status: Single Phone: [phone] Med Service: MED-Medicine   MRN: [id number] Acct# [id number] Arrival: 02/06/2019 18:56:00   Visit Reason: Leg pain-swelling; LEG PAIN/EMS Acuity: 4 LOS: 000 01:20   Address:    [address] [place] [address]   Diagnosis:    Left knee injury; Strain of right quadriceps  Medications:          New Medications  Printed Prescriptions  ibuprofen (ibuprofen 800 mg oral tablet) 1 Tabs Oral (given by mouth) every 8 hours as needed moderate pain (4-7) for 5 Days. Refills: 0.  Last Dose:____________________  lidocaine topical (Lidoderm 5% topical film) 1 Patches Topical (on the skin) every day for 10 Days. remove patches after 12 hours. Refills: 0.  Last Dose:____________________  Medications That Were Updated - Follow Current Instructions  Other Medications  Current meloxicam (meloxicam 7.5 mg oral tablet) 1 Tabs Oral (given by mouth) every day as needed other (see comment). pain. Refills: 0.  Last Dose:____________________    Medications that have not changed  Other Medications  acetaminophen (Tylenol Extra Strength 500 mg oral tablet) Oral (given by mouth) every 6 hours.  Last Dose:____________________  atenolol (atenolol 100 mg oral tablet) 1 Tabs Oral (given by mouth) every day.  Last Dose:____________________  cyclobenzaprine (cyclobenzaprine 10 mg oral tablet) 1 Tabs Oral (given by mouth) 3 times a day as needed.,  THIS MEDICATION IS ASSOCIATED  WITH  AN INCREASED RISK OF FALLS.  Last Dose:____________________  dicyclomine (Bentyl 20 mg oral tablet) 1 Tabs Oral (given by mouth) 4 times a day as needed mild pain (1-3) for 5 Days. Refills: 0., for abdominal pain  Last  Dose:____________________  fluticasone nasal (fluticasone 50 mcg/inh nasal spray) 1 Sprays Nasal (into the nose) 2 times a day.  Last Dose:____________________  levothyroxine (levothyroxine 75 mcg (0.075 mg) oral tablet) 1 Tabs Oral (given by mouth) every day.  Last Dose:____________________  lisinopril (lisinopril 40 mg oral tablet) 1 Tabs Oral (given by mouth) every day.  Last Dose:____________________  omeprazole (PriLOSEC 20 mg oral delayed release capsule) 1 Capsules Oral (given by mouth) every day. Refills: 0.  Last Dose:____________________  ondansetron (ondansetron 4 mg oral tablet) 1 Tabs Oral (given by mouth) every 6 hours as needed as needed for nausea/vomiting for 3 Days. Refills: 0.  Last Dose:____________________  oxybutynin (oxybutynin 5 mg/24 hours oral tablet, extended release) 5 Milligram Oral (given by mouth) every day.  Last Dose:____________________  simvastatin (simvastatin 20 mg oral tablet) 1 Tabs Oral (given by mouth) Once a Day (at bedtime).  Last Dose:____________________      Medications Administered During Visit:                Medication Dose Route   ibuprofen 800 mg Oral               Allergies      Lipitor      Major Tests and Procedures:  The following procedures and tests were performed during your ED visit.  COMMON PROCEDURES%>  COMMON PROCEDURES COMMENTS%>  PROVIDER INFORMATION               Provider Role Assigned Sampson LAURETHA NIEMANN ED MidLevel 02/06/2019 18:57:43    PLOTKIN, RN, EDSEL POUR ED Nurse 02/06/2019 19:07:30        Attending Physician:  LAURETHA NIEMANN      Admit Doc  MALONE-PA,  THERESA     Consulting Doc       VITALS INFORMATION  Vital Sign Triage Latest   Temp Oral ORAL_1%> ORAL%>   Temp Temporal TEMPORAL_1%> TEMPORAL%>   Temp Intravascular INTRAVASCULAR_1%> INTRAVASCULAR%>   Temp Axillary AXILLARY_1%> AXILLARY%>   Temp Rectal RECTAL_1%> RECTAL%>   02 Sat 99 % 99 %   Respiratory Rate RATE_1%> RATE%>   Peripheral Pulse Rate PULSE RATE_1%>  PULSE RATE%>   Apical Heart Rate HEART RATE_1%> HEART RATE%>   Blood Pressure BLOOD PRESSURE_1%>/ BLOOD PRESSURE_1%>90 mmHg BLOOD PRESSURE%> / BLOOD PRESSURE%>90 mmHg                 Immunizations      No Immunizations Documented This Visit          DISCHARGE INFORMATION   Discharge Disposition: H Outpt-Sent Home   Discharge Location:  Home   Discharge Date and Time:  02/06/2019 79:83:77   ED Checkout Date and Time:  02/06/2019 20:16:22     DEPART REASON INCOMPLETE INFORMATION               Depart Action Incomplete Reason   Interactive View/I&O Recently assessed               Problems      Active           Hypothyroid          Hypercholesteremia          HTN              Smoking Status      Former smoker         PATIENT EDUCATION INFORMATION  Instructions:     Knee Pain; Muscle Strain     Follow up:                   With: Address: When:   Follow-up with your orthopedist  Within 3 to 5 days   Comments:   Return to ED if symptoms worsen              ED PROVIDER DOCUMENTATION     Patient:   Kaitlin Porter, Kaitlin Porter             MRN: [id number]            FIN: [phone]               Age:   53 years     Sex:  Female     DOB:  1966/04/04   Associated Diagnoses:   Strain of right quadriceps; Left knee injury   Author:   LAURETHA NIEMANN      Basic Information   Time seen: Date & time 02/06/2019 19:25:00.   History source: Patient.   Arrival mode: Private vehicle.   History limitation: None.   Additional information: Chief Complaint from Nursing Triage Note   Chief Complaint  Chief Complaint: pt called ems reports R leg pain and L knee pain starting today hx of same. denies taking any pain reliever. ems reports pt ambulatory on scene. pt denies recent injury (02/06/19 19:03:00).  History of Present Illness   The patient presents with lower extremity pain and knee pain.  The course/duration of symptoms is constant.  The character of symptoms is pain.  The degree at present is moderate.  There are exacerbating factors including movement,  weight bearing and walking.  Associated symptoms: none.  Patient with h/o chronic right thigh pain and occasionally has exacerbation of her pain when walking or standing for prolonged periods of time.  Denies being evaluated for this pain in the past.  Also reports a fall onto her left knee while at home last night.  Denies head injury or LOC.  She states going to a store today, then developed right thigh and left knee pain while walking around.  She called EMS to bring her here for evaluation..        Review of Systems   Constitutional symptoms:  Negative except as documented in HPI.   Skin symptoms:  Negative except as documented in HPI.   Respiratory symptoms:  Negative except as documented in HPI.   Gastrointestinal symptoms:  Negative except as documented in HPI.   Musculoskeletal symptoms:  Muscle pain, Joint pain.    Neurologic symptoms:  Negative except as documented in HPI.   Psychiatric symptoms:  Negative except as documented in HPI.             Additional review of systems information: All other systems reviewed and otherwise negative.      Health Status   Allergies:    Allergic Reactions (Selected)  Mild  Lipitor- No reactions were documented..   Medications:  (Selected)   Prescriptions  Prescribed  Bentyl 20 mg oral tablet: 20 mg, 1 tabs, Oral, QID, for 5 days, PRN: mild pain (1-3), 20 tabs, 0 Refill(s)  PriLOSEC 20 mg oral delayed release capsule: 20 mg, 1 caps, Oral, Daily, 30 caps, 0 Refill(s)  meloxicam 7.5 mg oral tablet: 7.5 mg, 1 tabs, Oral, Daily, pain, PRN: other (see comment), 30 tabs, 0 Refill(s)  ondansetron 4 mg oral tablet: 4 mg, 1 tabs, Oral, q6hr, for 3 days, PRN: as needed for nausea/vomiting, 20 tabs, 0 Refill(s)  Documented Medications  Documented  Tylenol Extra Strength 500 mg oral tablet: mg, tabs, Oral, q6hr, 0 Refill(s)  atenolol 100 mg oral tablet: 100 mg, 1 tabs, Oral, Daily, 30 tabs, 0 Refill(s)  cyclobenzaprine 10 mg oral tablet: 10 mg, 1 tabs, Oral, TID, PRN, 30 tabs, 0  Refill(s)  fluticasone 50 mcg/inh nasal spray: 1 sprays, Nasal, BID, 16 g, 0 Refill(s)  levothyroxine 75 mcg (0.075 mg) oral tablet: 75 mcg, 1 tabs, Oral, Daily, 30 tabs, 0 Refill(s)  lisinopril 40 mg oral tablet: 40 mg, 1 tabs, Oral, Daily, 30 tabs, 0 Refill(s)  oxybutynin 5 mg/24 hours oral tablet, extended release: 5 mg, 1 tabs, Oral, Daily, 30 tabs, 0 Refill(s)  simvastatin 20 mg oral tablet: 20 mg, 1 tabs, Oral, Once a Day (at bedtime), 0 Refill(s).      Past Medical/ Family/ Social History   Medical history: Reviewed as documented in chart.   Surgical history: Reviewed as documented in chart.   Family history: Not significant.   Social history: Reviewed as documented in chart.   Problem list:    Active Problems (3)  HTN   Hypercholesteremia   Hypothyroid   .      Physical Examination               Vital Signs   Vital Signs   02/06/2019 19:03 EDT  Systolic Blood Pressure 132 mmHg    Diastolic Blood Pressure 90 mmHg    Temperature Oral 36.7 degC    Heart Rate Monitored 90 bpm    Respiratory Rate 17 br/min    SpO2 99 %    Measurements   02/06/2019 19:05 EDT Body Mass Index est meas 37.29 kg/m2    Body Mass Index Measured 37.29 kg/m2   02/06/2019 19:03 EDT Height/Length Measured 167 cm    Weight Dosing 104 kg    Basic Oxygen Information   02/06/2019 19:03 EDT SpO2 99 %    Oxygen Therapy Room air    General:  Alert, no acute distress.    Skin:  Warm, intact.    Head:  Normocephalic, atraumatic.    Respiratory:  Respirations are non-labored.   Musculoskeletal:  Lower extremity: Right, anterior, thigh, and left anterior knee, tenderness to palpation, no swelling, and ROM limited secondary to pain.SABRA   Neurological:  Normal speech observed, Cognitive function: Oriented x 3, to person, to place, to time.    Psychiatric:  Cooperative.      Medical Decision Making   Differential Diagnosis:  Contusion, sprain, strain, internal knee injury.    Documents reviewed:  Emergency department nurses' notes.   Radiology results:  Rad Results  (ST)   XR Tibia/Fibula Left  ?  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 The components demonstrate no evidence of loosening. Enthesopathy  of the superior aspect of the patella present. The tibia and fibula appear  intact. Arthritic changes at the level the ankle present. Edema present within  the knee and calf region. Mineralization is normal.  ?  Signed By: ELLIOTT AGENT D  ?  **************************************************  XR Femur Right  ?  02/06/19 19:53:50  XR Femur Right: AP and lateral 02/06/19    COMPARISON: None    INDICATION: Pain, arm or leg    FINDINGS/IMPRESSION: Overlying panniculus limits assessment of the femoral head  and neck. Enthesopathy of the greater trochanteric region present. The mid and  distal femur appear intact. Arthritic changes at the level of the knee joint  noted. If the patient has pain in the right hip joint region, additional images  could be helpful.  ?  Signed By: ELLIOTT AGENT D  .      Reexamination/ Reevaluation   Vital signs   Basic Oxygen Information   02/06/2019 19:03 EDT SpO2 99 %    Oxygen Therapy Room air      Course: unchanged.   Pain status: unchanged.   Assessment: exam unchanged.      Impression and Plan   Diagnosis   Strain of right quadriceps (ICD10-CM S76.111A, Discharge, Medical)   Left knee injury (ICD10-CM S89.92XA, Discharge, Medical)   Plan   Condition: Stable.    Disposition: Medically cleared, Discharged: Time  02/06/2019 20:06:00, to home.    Prescriptions: Launch prescriptions   Pharmacy:  Lidoderm 5% topical film (Prescribe): 1 patches, Topical, Daily, for 10 days, remove patches after 12 hours, 10 patches, 0 Refill(s)  ibuprofen 800 mg oral tablet (Prescribe): 800 mg, 1 tabs, Oral, q8hr, for 5 days, PRN: moderate pain (4-7), 15 tabs, 0 Refill(s).    Patient was given the following  educational materials: Muscle Strain, Knee Pain.    Follow up with: Follow-up with your orthopedist Within 3 to 5 days  Return to ED if symptoms worsen.    Counseled: Patient, Regarding diagnosis, Regarding diagnostic results, Regarding treatment plan, Regarding prescription, Patient indicated understanding of instructions.

## 2019-02-06 NOTE — ED Notes (Signed)
ED Patient Education Note     Patient Education Materials Follows:  Musculoskeletal     Knee Pain    Knee pain is a very common symptom and can have many causes. Knee pain often goes away when you follow your health care provider's instructions for relieving pain and discomfort at home. However, knee pain can develop into a condition that needs treatment. Some conditions may include:       Arthritis caused by wear and tear (osteoarthritis).     Arthritis caused by swelling and irritation (rheumatoid arthritis or gout).     A cyst or growth in your knee.     An infection in your knee joint.     An injury that will not heal.     Damage, swelling, or irritation of the tissues that support your knee (torn ligaments or tendinitis).    If your knee pain continues, additional tests may be ordered to diagnose your condition. Tests may include X-rays or other imaging studies of your knee. You may also need to have fluid removed from your knee. Treatment for ongoing knee pain depends on the cause, but treatment may include:     Medicines to relieve pain or swelling.     Steroid injections in your knee.     Physical therapy.     Surgery.    HOME CARE INSTRUCTIONS     Take medicines only as directed by your health care provider.      Rest your knee and keep it raised (elevated) while you are resting.     Do not do things that cause or worsen pain.     Avoid high-impact activities or exercises, such as running, jumping rope, or doing jumping jacks.     Apply ice to the knee area:    ? Put ice in a plastic bag.    ? Place a towel between your skin and the bag.    ? Leave the ice on for 20 minutes, 2?3 times a day.     Ask your health care provider if you should wear an elastic knee support.     Keep a pillow under your knee when you sleep.     Lose weight if you are overweight. Extra weight can put pressure on your knee.     Do not use any tobacco products, including cigarettes, chewing tobacco, or electronic cigarettes. If you  need help quitting, ask your health care provider. Smoking may slow the healing of any bone and joint problems that you may have.    SEEK MEDICAL CARE IF:     Your knee pain continues, changes, or gets worse.     You have a fever along with knee pain.     Your knee buckles or locks up.     Your knee becomes more swollen.    SEEK IMMEDIATE MEDICAL CARE IF:     Your knee joint feels hot to the touch.     You have chest pain or trouble breathing.    This information is not intended to replace advice given to you by your health care provider. Make sure you discuss any questions you have with your health care provider.    Document Released: 07/16/2007 Document Revised: 10/09/2014 Document Reviewed: 05/04/2014  Elsevier Interactive Patient Education ?2016 [employer]         Muscle Strain    A muscle strain is an injury that occurs when a muscle is stretched beyond its normal length. Usually  a small number of muscle fibers are torn when this happens. Muscle strain is rated in degrees. First-degree strains have the least amount of muscle fiber tearing and pain. Second-degree and third-degree strains have increasingly more tearing and pain.     Usually, recovery from muscle strain takes 1?2 weeks. Complete healing takes 5?6 weeks.       CAUSES    Muscle strain happens when a sudden, violent force placed on a muscle stretches it too far. This may occur with lifting, sports, or a fall.     RISK FACTORS    Muscle strain is especially common in athletes.     SIGNS AND SYMPTOMS    At the site of the muscle strain, there may be:     Pain.     Bruising.     Swelling.      Difficulty using the muscle due to pain or lack of normal function.     DIAGNOSIS    Your health care provider will perform a physical exam and ask about your medical history.    TREATMENT    Often, the best treatment for a muscle strain is resting, icing, and applying cold compresses to the injured area. ?    HOME CARE INSTRUCTIONS     Use the PRICE method of  treatment to promote muscle healing during the first 2?3 days after your injury. The PRICE method involves:    ? Protecting the muscle from being injured again.    ? Restricting your activity and resting the injured body part.     ? Icing your injury. To do this, put ice in a plastic bag. Place a towel between your skin and the bag. Then, apply the ice and leave it on from 15?20 minutes each hour. After the third day, switch to moist heat packs.     ? Apply compression to the injured area with a splint or elastic bandage. Be careful not to wrap it too tightly. This may interfere with blood circulation or increase swelling.     ? Elevate the injured body part above the level of your heart as often as you can.     Only take over-the-counter or prescription medicines for pain, discomfort, or fever as directed by your health care provider.      Warming up prior to exercise helps to prevent future muscle strains.     SEEK MEDICAL CARE IF:     You have increasing pain or swelling in the injured area.     You have numbness, tingling, or a significant loss of strength in the injured area.     MAKE SURE YOU:     Understand these instructions.      Will watch your condition.     Will get help right away if you are not doing well or get worse.    This information is not intended to replace advice given to you by your health care provider. Make sure you discuss any questions you have with your health care provider.    Document Released: 09/18/2005 Document Revised: 07/09/2013 Document Reviewed: 04/17/2013  Elsevier Interactive Patient Education ?2016 [employer]

## 2019-02-06 NOTE — ED Notes (Signed)
 ED Triage Note       ED Secondary Triage Entered On:  02/06/2019 19:07 EDT    Performed On:  02/06/2019 19:05 EDT by PAT, RN, DANIELLE K               General Information   Barriers to Learning :   None evident   ED Home Meds Section :   Document assessment   [facility] ED Fall Risk Section :   Document assessment   ED History Section :   Document assessment   ED Advance Directives Section :   Document assessment   PLOTKIN, RN, EDSEL POUR - 02/06/2019 19:05 EDT   (As Of: 02/06/2019 19:07:02 EDT)   Problems(Active)    HTN (SNOMED CT  :AZEGxwEmLzUcjRiawKgAAg )  Name of Problem:   HTN ; Recorder:   CABE, RN, ADAM Z; Confirmation:   Confirmed ; Classification:   Medical ; Code:   AZEGxwEmLzUcjRiawKgAAg ; Contributor System:   PowerChart ; Last Updated:   09/18/2016 13:23 EST ; Life Cycle Date:   09/18/2016 ; Life Cycle Status:   Active ; Vocabulary:   SNOMED CT        Hypercholesteremia (IMO  :53324 )  Name of Problem:   Hypercholesteremia ; Recorder:   CABE, RN, ADAM Z; Confirmation:   Confirmed ; Classification:   Medical ; Code:   53324 ; Contributor System:   PowerChart ; Last Updated:   09/18/2016 13:23 EST ; Life Cycle Date:   09/18/2016 ; Life Cycle Status:   Active ; Vocabulary:   IMO        Hypothyroid (IMO  :R5280829 )  Name of Problem:   Hypothyroid ; Recorder:   MALONE, RN, JENNIFER L; Confirmation:   Confirmed ; Classification:   Medical ; Code:   [id number] ; Contributor System:   PowerChart ; Last Updated:   09/19/2016 14:07 EST ; Life Cycle Date:   09/19/2016 ; Life Cycle Status:   Active ; Vocabulary:   IMO          Diagnoses(Active)    Leg pain-swelling  Date:   02/06/2019 ; Diagnosis Type:   Reason For Visit ; Confirmation:   Complaint of ; Clinical Dx:   Leg pain-swelling ; Classification:   Medical ; Clinical Service:   Emergency medicine ; Code:   PNED ; Probability:   0 ; Diagnosis Code:   [id number] EE             -    Procedure History   (As Of: 02/06/2019 19:07:02 EDT)     Anesthesia  Minutes:   0 ; Procedure Name:   VP shunt ; Procedure Minutes:   0            Anesthesia Minutes:   0 ; Procedure Name:   Cholecystectomy ; Procedure Minutes:   0            [facility] Fall Risk Assessment Tool   Hx of falling last 3 months ED Fall :   Yes (Single mechanical fall)   Patient confused or disoriented ED Fall :   No   Patient intoxicated or sedated ED Fall :   No   Patient impaired gait ED Fall :   No   Use a mobility assistance device ED Fall :   No   Patient altered elimination ED Fall :   No   [facility] ED Fall Score :   1    PLOTKIN, RN, DANIELLE K - 02/06/2019 19:05 EDT  ED Advance Directive   Advance Directive :   No   PLOTKIN, RN, EDSEL POUR - 02/06/2019 19:05 EDT   Social History   Social History   (As Of: 02/06/2019 19:07:02 EDT)   Tobacco:        Former smoker, Cigarettes   Comments:  09/29/2016 2:36 - ALBENESIUS, RN, KRISTIN: teenage years.   (Last Updated: 09/29/2016 02:36:19 EST by BELLE, RN, KRISTIN)            Med Hx   Medication List   (As Of: 02/06/2019 19:07:02 EDT)   Prescription/Discharge Order    dicyclomine  :   dicyclomine ; Status:   Prescribed ; Ordered As Mnemonic:   Bentyl 20 mg oral tablet ; Simple Display Line:   20 mg, 1 tabs, Oral, QID, for 5 days, PRN: mild pain (1-3), 20 tabs, 0 Refill(s) ; Ordering Provider:   MARIAH SELINDA LABOR; Catalog Code:   dicyclomine ; Order Dt/Tm:   01/04/2019 11:56:50 EDT ; Comment:   for abdominal pain          ondansetron  :   ondansetron ; Status:   Prescribed ; Ordered As Mnemonic:   ondansetron 4 mg oral tablet ; Simple Display Line:   4 mg, 1 tabs, Oral, q6hr, for 3 days, PRN: as needed for nausea/vomiting, 20 tabs, 0 Refill(s) ; Ordering Provider:   MARIAH SELINDA LABOR; Catalog Code:   ondansetron ; Order Dt/Tm:   01/04/2019 11:56:53 EDT          omeprazole  :   omeprazole ; Status:   Prescribed ; Ordered As Mnemonic:   PriLOSEC 20 mg oral delayed release capsule ; Simple Display Line:   20 mg, 1 caps, Oral, Daily, 30 caps, 0 Refill(s) ;  Ordering Provider:   PRICE-MD,  KEVIN CHARLES; Catalog Code:   omeprazole ; Order Dt/Tm:   02/19/2018 23:32:47 EDT          meloxicam  :   meloxicam ; Status:   Prescribed ; Ordered As Mnemonic:   meloxicam 7.5 mg oral tablet ; Simple Display Line:   7.5 mg, 1 tabs, Oral, Daily, pain, PRN: other (see comment), 30 tabs, 0 Refill(s) ; Ordering Provider:   CANDIDA ALM NED; Catalog Code:   meloxicam ; Order Dt/Tm:   09/18/2016 16:55:32 EST            Home Meds    acetaminophen  :   acetaminophen ; Status:   Documented ; Ordered As Mnemonic:   Tylenol Extra Strength 500 mg oral tablet ; Simple Display Line:   mg, tabs, Oral, q6hr, 0 Refill(s) ; Catalog Code:   acetaminophen ; Order Dt/Tm:   01/04/2019 10:30:40 EDT          cyclobenzaprine  :   cyclobenzaprine ; Status:   Documented ; Ordered As Mnemonic:   cyclobenzaprine 10 mg oral tablet ; Simple Display Line:   10 mg, 1 tabs, Oral, TID, PRN, 30 tabs, 0 Refill(s) ; Catalog Code:   cyclobenzaprine ; Order Dt/Tm:   09/29/2016 04:32:44 EST ; Comment:    THIS MEDICATION IS ASSOCIATED   WITH   AN INCREASED RISK OF FALLS.          atenolol  :   atenolol ; Status:   Documented ; Ordered As Mnemonic:   atenolol 100 mg oral tablet ; Simple Display Line:   100 mg, 1 tabs, Oral, Daily, 30 tabs, 0 Refill(s) ; Catalog Code:   atenolol ;  Order Dt/Tm:   09/19/2016 14:08:51 EST          fluticasone nasal  :   fluticasone nasal ; Status:   Documented ; Ordered As Mnemonic:   fluticasone 50 mcg/inh nasal spray ; Simple Display Line:   1 sprays, Nasal, BID, 16 g, 0 Refill(s) ; Catalog Code:   fluticasone nasal ; Order Dt/Tm:   09/19/2016 14:08:51 EST          HYDROcodone-acetaminophen  :   HYDROcodone-acetaminophen ; Status:   Processing ; Ordered As Mnemonic:   HYDROcodone-acetaminophen 5-325 Emergi[employer] ; Action Display:   Complete ; Catalog Code:   HYDROcodone-acetaminophen ; Order Dt/Tm:   02/06/2019 19:06:23 EDT          levothyroxine  :   levothyroxine ; Status:   Documented ;  Ordered As Mnemonic:   levothyroxine 75 mcg (0.075 mg) oral tablet ; Simple Display Line:   75 mcg, 1 tabs, Oral, Daily, 30 tabs, 0 Refill(s) ; Catalog Code:   levothyroxine ; Order Dt/Tm:   09/19/2016 14:08:51 EST          lisinopril  :   lisinopril ; Status:   Documented ; Ordered As Mnemonic:   lisinopril 40 mg oral tablet ; Simple Display Line:   40 mg, 1 tabs, Oral, Daily, 30 tabs, 0 Refill(s) ; Catalog Code:   lisinopril ; Order Dt/Tm:   09/19/2016 14:08:51 EST          meloxicam  :   meloxicam ; Status:   Processing ; Ordered As Mnemonic:   meloxicam 7.5 mg oral tablet ; Action Display:   Complete ; Catalog Code:   meloxicam ; Order Dt/Tm:   02/06/2019 19:06:07 EDT          oxybutynin  :   oxybutynin ; Status:   Documented ; Ordered As Mnemonic:   oxybutynin 5 mg/24 hours oral tablet, extended release ; Simple Display Line:   5 mg, 1 tabs, Oral, Daily, 30 tabs, 0 Refill(s) ; Catalog Code:   oxybutynin ; Order Dt/Tm:   09/19/2016 14:08:51 EST          simvastatin  :   simvastatin ; Status:   Documented ; Ordered As Mnemonic:   simvastatin 20 mg oral tablet ; Simple Display Line:   20 mg, 1 tabs, Oral, Once a Day (at bedtime), 0 Refill(s) ; Catalog Code:   simvastatin ; Order Dt/Tm:   09/19/2016 14:08:51 EST

## 2019-02-11 LAB — RAPID INFLUENZA A/B ANTIGENS
Lot/Kit Number: 705234
RAPID FLU TEST: NEGATIVE
Rapid Influenza A Ag: NEGATIVE
Rapid Influenza B Ag: NEGATIVE

## 2019-02-11 NOTE — ED Notes (Signed)
 ED Triage Note       ED Triage Adult Entered On:  02/11/2019 1:39 EDT    Performed On:  02/11/2019 1:31 EDT by Gerhard Patron               Triage   Chief Complaint :   pt c/o pain in my stomache x 1 day. denies n/v/d or fever. per EMS report pt initially only c/o a small laceration to left ring finger. pt drowsy during triage.    Numeric Rating Pain Scale :   5 = Moderate pain   [facility] Mode of Arrival :   Ambulance   Infectious Disease Documentation :   Document assessment   Patient received chemo or biotherapy last 48 hrs? :   No   Temperature Oral :   36.5 degC(Converted to: 97.7 degF)    Heart Rate Monitored :   80 bpm   Respiratory Rate :   16 br/min   Systolic Blood Pressure :   144 mmHg (HI)    Diastolic Blood Pressure :   81 mmHg   SpO2 :   100 %   Oxygen Therapy :   Room air   Patient presentation :   None of the above   Chief Complaint or Presentation suggest infection :   No   Dosing Weight Obtained By :   Patient stated   Weight Dosing :   100 kg(Converted to: 220 lb 7 oz)    Height :   167 cm(Converted to: 5 ft 6 in)    Body Mass Index Dosing :   36 kg/m2   Gerhard Patron - 02/11/2019 1:31 EDT   DCP GENERIC CODE   Tracking Acuity :   3   Tracking Group :   ED [id number] Tracking [employer]   Gerhard Patron - 02/11/2019 1:31 EDT   ED General Section :   Document assessment   Pregnancy Status :   Patient denies   ED Allergies Section :   Document assessment   ED Reason for Visit Section :   Document assessment   Gerhard Patron - 02/11/2019 1:31 EDT   ID Risk Screen Symptoms   Recent Travel History :   No recent travel   Close Contact with COVID-19  ID :   No   Have you been tested for COVID-19 ID :   No   TB Symptom Screen :   No symptoms   C. diff Symptom/History ID :   Neither of the above   Gerhard Patron - 02/11/2019 1:31 EDT   Allergies   (As Of: 02/11/2019 01:39:41 EDT)   Allergies (Active)   Lipitor  Estimated Onset Date:   Unspecified ; Created ByBETHA FLOR, RN, LARA J;  Reaction Status:   Active ; Category:   Drug ; Substance:   Lipitor ; Type:   Allergy ; Severity:   Mild ; Updated By:   FLOR RN, KARILYN PARAS; Reviewed Date:   02/11/2019 1:36 EDT        Psycho-Social   Last 3 mo, thoughts killing self/others :   Patient denies   Feels Unsafe at Home :   Yes   Gerhard Patron - 02/11/2019 1:31 EDT   ED Reason for Visit   (As Of: 02/11/2019 01:39:41 EDT)   Problems(Active)    HTN (SNOMED CT  :AZEGxwEmLzUcjRiawKgAAg )  Name of Problem:   HTN ; Recorder:   CABE, RN, ADAM Z; Confirmation:  Confirmed ; Classification:   Medical ; Code:   AZEGxwEmLzUcjRiawKgAAg ; Contributor System:   PowerChart ; Last Updated:   09/18/2016 13:23 EST ; Life Cycle Date:   09/18/2016 ; Life Cycle Status:   Active ; Vocabulary:   SNOMED CT        Hypercholesteremia (IMO  :53324 )  Name of Problem:   Hypercholesteremia ; Recorder:   CABE, RN, ADAM Z; Confirmation:   Confirmed ; Classification:   Medical ; Code:   53324 ; Contributor System:   PowerChart ; Last Updated:   09/18/2016 13:23 EST ; Life Cycle Date:   09/18/2016 ; Life Cycle Status:   Active ; Vocabulary:   IMO        Hypothyroid (IMO  :[id number] )  Name of Problem:   Hypothyroid ; Recorder:   MALONE, RN, JENNIFER L; Confirmation:   Confirmed ; Classification:   Medical ; Code:   [id number] ; Contributor System:   PowerChart ; Last Updated:   09/19/2016 14:07 EST ; Life Cycle Date:   09/19/2016 ; Life Cycle Status:   Active ; Vocabulary:   IMO          Diagnoses(Active)    Abdominal pain  Date:   02/11/2019 ; Diagnosis Type:   Reason For Visit ; Confirmation:   Complaint of ; Clinical Dx:   Abdominal pain ; Classification:   Medical ; Clinical Service:   Non-Specified ; Code:   PNED ; Probability:   0 ; Diagnosis Code:   [id number]

## 2019-02-11 NOTE — ED Notes (Signed)
ED Pre-Arrival Note        Pre-Arrival Summary    Name:  medic 48,    Current Date:  02/11/2019 01:31:29 EDT  Gender:  Female  Date of Birth:    Age:  53  Pre-Arrival Type:  EMS  ETA:  02/11/2019 01:49:00 EDT  Primary Care Physician:    Presenting Problem:  multiple complaints  Pre-Arrival User:  Alger Simons, RN, Shary Decamp  Referring Source:    Location:  [facility]            PreArrival Communication Form  Emergency Department        Additional Patient Information:        Orders:  [    ] CBC                                            [     ] CT Head no contrast  [    ] BMP                                           [     ] CT Abdomen/Pelvis no contrast  [    ] PT/INR                                       [     ] CT Abdomen/Pelvis IV contrast, w/ oral contrast  [    ] Troponin                                   [     ] CT Abdomen/Pelvis IV contrast, no oral contrast  [    ] BNP                                            [     ] See ordersheet  [    ] CXR                                             [     ] Other:__________________________  [    ] EKG

## 2019-02-11 NOTE — ED Provider Notes (Signed)
[facility] - Back Pain        Patient:   Kaitlin Porter, Kaitlin Porter             MRN: [id number]            FIN: [phone]               Age:   53 years     Sex:  Female     DOB:  Oct 15, 1965   Associated Diagnoses:   Chronic low back pain; Right hip pain; Multiple complaints   Author:   Rozelle Logan      Basic Information   Time seen: Provider Seen (ST)   ED Provider/Time:    Rozelle Logan / 02/11/2019 01:31  .   History source: Patient.   Arrival mode: Private vehicle.   History limitation: None.   Additional information: Chief Complaint from Nursing Triage Note   Chief Complaint  Chief Complaint: pt c/o "pain in my stomache" x 1 day. denies n/v/d or fever. per EMS report pt initially only c/o a small laceration to left ring finger. pt drowsy during triage. (02/11/19 01:31:00).      History of Present Illness   The patient presents with back pain.  The onset was chronic.  The course/duration of symptoms is worsening.  The location where the incident occurred was unknown.  Location: lumbar. Radiating pain: right groin. The character of symptoms is unknown.  The degree at onset was unknown.  The degree at present is severe.  Prior episodes: chronic.  Therapy today: emergency medical services.  Associated symptoms: none.  Patient with h/o schizophrenia.  She comes in by EMS with multiple complaints:    1) abdominal pain: c/o generalized abdominal pain and had been evaluated by [facility] (with CT, xr, and labs).  She was scheduled for a colonoscopy for further evaluation, but did not make her appointment.  She denies any changes in pain since she was last evaluated for this.  Denies n/v/d or fever.    2) c/o left knee pain: she was evaluated on 02/06/19 for this with x-rays negative for acute findings.  Denies new injury since she was last evaluated for this    3) Superficial abrasions to the left index finger after picking up a knife while washing dishes.  There is no active bleeding on this visit.    4) Exacerbation  of chronic LBP and right hip pain.  She denies new injury.  She has been taking Flexeril prescribed by her family MD, but reports no relief..        Review of Systems   Constitutional symptoms:  Negative except as documented in HPI.   Skin symptoms:  superficial abrasions to the left index finger.   Respiratory symptoms:  Negative except as documented in HPI.   Gastrointestinal symptoms:  Abdominal pain, mild, diffuse.    Musculoskeletal symptoms:  Back pain, Joint pain, Reports: Right, hip, chronic, pain, chronic left knee pain.    Psychiatric symptoms:  schizophrenia.             Additional review of systems information: All other systems reviewed and otherwise negative.      Health Status   Allergies:    Allergic Reactions (Selected)  Mild  Lipitor- No reactions were documented..   Medications:  (Selected)   Prescriptions  Prescribed  Bentyl 20 mg oral tablet: 20 mg, 1 tabs, Oral, QID, for 5 days, PRN: mild pain (1-3), 20 tabs, 0 Refill(s)  Lidoderm 5% topical film: 1 patches, Topical, Daily, for 10 days, remove patches after 12 hours, 10 patches, 0 Refill(s)  PriLOSEC 20 mg oral delayed release capsule: 20 mg, 1 caps, Oral, Daily, 30 caps, 0 Refill(s)  ibuprofen 800 mg oral tablet: 800 mg, 1 tabs, Oral, q8hr, for 5 days, PRN: moderate pain (4-7), 15 tabs, 0 Refill(s)  meloxicam 7.5 mg oral tablet: 7.5 mg, 1 tabs, Oral, Daily, pain, PRN: other (see comment), 30 tabs, 0 Refill(s)  ondansetron 4 mg oral tablet: 4 mg, 1 tabs, Oral, q6hr, for 3 days, PRN: as needed for nausea/vomiting, 20 tabs, 0 Refill(s)  Documented Medications  Documented  Tylenol Extra Strength 500 mg oral tablet: mg, tabs, Oral, q6hr, 0 Refill(s)  atenolol 100 mg oral tablet: 100 mg, 1 tabs, Oral, Daily, 30 tabs, 0 Refill(s)  cyclobenzaprine 10 mg oral tablet: 10 mg, 1 tabs, Oral, TID, PRN, 30 tabs, 0 Refill(s)  fluticasone 50 mcg/inh nasal spray: 1 sprays, Nasal, BID, 16 g, 0 Refill(s)  levothyroxine 75 mcg (0.075 mg) oral tablet: 75 mcg, 1  tabs, Oral, Daily, 30 tabs, 0 Refill(s)  lisinopril 40 mg oral tablet: 40 mg, 1 tabs, Oral, Daily, 30 tabs, 0 Refill(s)  oxybutynin 5 mg/24 hours oral tablet, extended release: 5 mg, 1 tabs, Oral, Daily, 30 tabs, 0 Refill(s)  simvastatin 20 mg oral tablet: 20 mg, 1 tabs, Oral, Once a Day (at bedtime), 0 Refill(s).      Past Medical/ Family/ Social History   Medical history: Reviewed as documented in chart.   Surgical history: Reviewed as documented in chart.   Family history: Not significant.   Social history: Reviewed as documented in chart.   Problem list:    Active Problems (3)  HTN   Hypercholesteremia   Hypothyroid   .      Physical Examination               Vital Signs   Vital Signs   02/11/2019 1:31 EDT Systolic Blood Pressure 144 mmHg  HI    Diastolic Blood Pressure 81 mmHg    Temperature Oral 36.5 degC    Heart Rate Monitored 80 bpm    Respiratory Rate 16 br/min    SpO2 100 %    General:  No acute distress.   Skin:  Wound(s): Left index finger , abrasion.   Head:  Normocephalic, atraumatic.    Respiratory:  Respirations are non-labored.   Gastrointestinal:  Soft, Nontender.    Back:  Lumbar: Midline, moderate, tenderness.   Musculoskeletal:  Lower extremity: Right, hip, tenderness, range of motion restricted by pain.   Neurological:  Alert and oriented to person, place, time, and situation, No focal neurological deficit observed, normal speech observed.    Psychiatric:  Cooperative, appropriate mood & affect.       Medical Decision Making   Differential Diagnosis:  Back pain, degenerative disc disease, disc herniation, sciatica, chronic back pain, compression fracture.    Documents reviewed:  Emergency department nurses' notes.      Reexamination/ Reevaluation   Pain status: decreased.   Assessment: exam improved.      Impression and Plan   Diagnosis   Chronic low back pain (ICD10-CM M54.5, Discharge, Medical)   Right hip pain (ICD10-CM M25.551, Discharge, Medical)   Multiple complaints (ICD10-CM R68.89,  Discharge, Medical)   Plan   Condition: Improved, Stable.    Disposition: Medically cleared, Discharged: to home.    Prescriptions: Launch Meds List (Selected)   Prescriptions  Prescribed  Lidoderm 5% topical film: 1 patches, Topical, Daily, for 10 days, remove patches after 12 hours, 10 patches, 0 Refill(s)  predniSONE 20 mg oral tablet: 40 mg, 2 tabs, Oral, Daily, for 5 days, 10 tabs, 0 Refill(s).    Patient was given the following educational materials: Degenerative Disk Disease.    Follow up with: Pollyann Glen, Physician - Physical Medicine Rehab Within 5 to 7 days Return to ED if symptoms worsen; HARRY RUDOLPH-MD, Physician - Orthopedics Within 5 to 7 days.    Counseled: Patient, Regarding diagnosis, Regarding diagnostic results, Regarding treatment plan, Regarding prescription.    Signature Line     Electronically Signed on 02/11/2019 03:55 AM EDT   ________________________________________________   Rozelle Logan      Electronically Signed on 02/11/2019 06:17 AM EDT   ________________________________________________   Kassie Mends, MD            Modified by: Rozelle Logan on 02/11/2019 03:55 AM EDT

## 2019-02-11 NOTE — ED Notes (Signed)
ED Patient Summary       ;       [facility] Emergency Department  [address], [place] [address]  [phone]  Discharge Instructions (Patient)  _______________________________________     Name: Kaitlin Porter, Kaitlin Porter  DOB: Aug 20, 1966                   MRN: [id number]                   FIN: [id number]  Reason For Visit: UC - Back Pain; Abdominal pain; MULTIPLE COMPLAINTS  Final Diagnosis: Chronic low back pain; Right hip pain     Visit Date: 02/11/2019 01:30:00  Address: [address] [facility] [address]  Phone: [phone]     Primary Care Provider:      Name: Lorenza Chick      Phone: [phone]        Emergency Department Providers:         Primary Physician:            Alesia Richards would like to thank you for allowing [place] to assist you with your healthcare needs. The following includes patient education materials and information regarding your injury/illness.     Follow-up Instructions: You were treated today on an emergency basis, it may be wise to contact your primary care provider to notify them of your visit today. You may have been referred to your regular doctor or a specialist, please follow up as instructed. If your condition worsens or you can't get in to see the doctor, contact the Emergency Department.              With: Address: When:   Pollyann Glen, Physician - Physical Medicine Rehab [address] [place], [place] [address]  [phone] Business (1) Within 5 to 7 days   Comments:   Return to ED if symptoms worsen       With: Address: When:   HARRY RUDOLPH-MD, Physician - Orthopedics [address], [address] [place], [place] [address]  [phone] Business (1) Within 5 to 7 days              Printed Prescriptions:    Patient Education Materials:  Discharge Orders          Discharge Patient 02/11/19 3:04:00 EDT         Comment:      Degenerative Disk Disease     Degenerative Disk Disease    Degenerative disk disease is a  condition caused by the changes that occur in spinal disks as you grow older. Spinal disks are soft and compressible disks located between the bones of your spine (vertebrae). These disks act like shock absorbers. Degenerative disk disease can affect the whole spine. However, the neck and lower back are most commonly affected. Many changes can occur in the spinal disks with aging, such as:     The spinal disks may dry and shrink.     Small tears may occur in the tough, outer covering of the disk (annulus).      The disk space may become smaller due to loss of water.      Abnormal growths in the bone (spurs) may occur. This can put pressure on the nerve roots exiting the spinal canal, causing pain.      The spinal canal may become narrowed.     RISK FACTORS     Being overweight.  Having a family history of degenerative disk disease.     Smoking.     There is increased risk if you are doing heavy lifting or have a sudden injury.     SIGNS AND SYMPTOMS    Symptoms vary from person to person and may include:     Pain that varies in intensity. Some people have no pain, while others have severe pain. The location of the pain depends on the part of your backbone that is affected.     ? You will have neck or arm pain if a disk in the neck area is affected.     ? You will have pain in your back, buttocks, or legs if a disk in the lower back is affected.      Pain that becomes worse while bending, reaching up, or with twisting movements.      Pain that may start gradually and then get worse as time passes. It may also start after a major or minor injury.      Numbness or tingling in the arms or legs.     DIAGNOSIS    Your health care provider will ask you about your symptoms and about activities or habits that may cause the pain. He or she may also ask about any injuries, diseases, or treatments you have had. Your health care provider will examine you to check for the range of movement that is possible in the affected  area, to check for strength in your extremities, and to check for sensation in the areas of the arms and legs supplied by different nerve roots. You may also have:      An X-ray of the spine.     Other imaging tests, such as MRI.     TREATMENT    Your health care provider will advise you on the best plan for treatment. Treatment may include:     Medicines.     Rehabilitation exercises.     HOME CARE INSTRUCTIONS     Follow proper lifting and walking techniques as advised by your health care provider.     Maintain good posture.      Exercise regularly as advised by your health care provider.     Perform relaxation exercises.     Change your sitting, standing, and sleeping habits as advised by your health care provider.     Change positions frequently.      Lose weight or maintain a healthy weight as advised by your health care provider.     Do not use any tobacco products, including cigarettes, chewing tobacco, or electronic cigarettes. If you need help quitting, ask your health care provider.     Wear supportive footwear.     Take medicines only as directed by your health care provider.     SEEK MEDICAL CARE IF:     Your pain does not go away within 1?4 weeks.     You have significant appetite or weight loss.    SEEK IMMEDIATE MEDICAL CARE IF:     Your pain is severe.     You notice weakness in your arms, hands, or legs.     You begin to lose control of your bladder or bowel movements.     You have fevers or night sweats.    MAKE SURE YOU:     Understand these instructions.      Will watch your condition.     Will get help right away if you are  not doing well or get worse.    This information is not intended to replace advice given to you by your health care provider. Make sure you discuss any questions you have with your health care provider.    Document Released: 07/16/2007 Document Revised: 10/09/2014 Document Reviewed: 01/20/2014  Elsevier Interactive Patient Education ?2016 Elsevier Inc.         Allergy Info:  Lipitor     Medication Information:  [facility] provided you with a complete list of medications post discharge, if you have been instructed to stop taking a medication please ensure you also follow up with this information to your Primary Care Physician.  Unless otherwise noted, patient will continue to take medications as prescribed prior to the Emergency Room visit.  Any specific questions regarding your chronic medications and dosages should be discussed with your physician(s) and pharmacist.          acetaminophen (Tylenol Extra Strength 500 mg oral tablet) Oral (given by mouth) every 6 hours.  atenolol (atenolol 100 mg oral tablet) 1 Tabs Oral (given by mouth) every day.  cyclobenzaprine (cyclobenzaprine 10 mg oral tablet) 1 Tabs Oral (given by mouth) 3 times a day as needed., " THIS MEDICATION IS ASSOCIATED  WITH  AN INCREASED RISK OF FALLS."  dicyclomine (Bentyl 20 mg oral tablet) 1 Tabs Oral (given by mouth) 4 times a day as needed mild pain (1-3) for 5 Days. Refills: 0., for abdominal pain  fluticasone nasal (fluticasone 50 mcg/inh nasal spray) 1 Sprays Nasal (into the nose) 2 times a day.  ibuprofen (ibuprofen 800 mg oral tablet) 1 Tabs Oral (given by mouth) every 8 hours as needed moderate pain (4-7) for 5 Days. Refills: 0.  levothyroxine (levothyroxine 75 mcg (0.075 mg) oral tablet) 1 Tabs Oral (given by mouth) every day.  lidocaine topical (Lidoderm 5% topical film) 1 Patches Topical (on the skin) every day for 10 Days. remove patches after 12 hours. Refills: 0.  lidocaine topical (Lidoderm 5% topical film) 1 Patches Topical (on the skin) every day for 10 Days. remove patches after 12 hours. Refills: 0.  lisinopril (lisinopril 40 mg oral tablet) 1 Tabs Oral (given by mouth) every day.  meloxicam (meloxicam 7.5 mg oral tablet) 1 Tabs Oral (given by mouth) every day as needed other (see comment). pain. Refills: 0.  omeprazole (PriLOSEC 20 mg oral delayed release capsule) 1  Capsules Oral (given by mouth) every day. Refills: 0.  ondansetron (ondansetron 4 mg oral tablet) 1 Tabs Oral (given by mouth) every 6 hours as needed as needed for nausea/vomiting for 3 Days. Refills: 0.  oxybutynin (oxybutynin 5 mg/24 hours oral tablet, extended release) 5 Milligram Oral (given by mouth) every day.  predniSONE (predniSONE 20 mg oral tablet) 2 Tabs Oral (given by mouth) every day for 5 Days. Refills: 0.  simvastatin (simvastatin 20 mg oral tablet) 1 Tabs Oral (given by mouth) Once a Day (at bedtime).      Medications Administered During Visit:              Medication Dose Route   ketorolac 15 mg IV Push   ketorolac 15 mg IV Push   dexamethasone 10 mg IV Push          Major Tests and Procedures:  The following procedures and tests were performed during your ED visit.  COMMON PROCEDURES%>  COMMON PROCEDURES COMMENTS%>          Laboratory Orders  Name Status Details  Flu A B Completed Nasopharyngeal Swab, Stat, ST - Stat, 02/11/19 1:56:00 EDT, 02/11/19 1:56:00 EDT, Nurse collect, Rozelle Logan, Print label Y/N               Radiology Orders  Name Status Details   XR Hip Complete Right Ordered 02/11/19 1:56:00 EDT, STAT 1 hour or less, Reason: Pain, Joint, Hip, Pelvis or Thigh, Transport Mode: STRETCHER, pp_set_radiology_subspecialty   XR Spine Lumbosacral 2 or 3 Views Ordered 02/11/19 1:56:00 EDT, STAT 1 hour or less, Reason: Pain, Back, Unspecified, 3 Views, Transport Mode: STRETCHER, pp_set_radiology_subspecialty               Patient Care Orders  Name Status Details   Discharge Patient Ordered 02/11/19 3:04:00 EDT   ED Assessment Adult Completed 02/11/19 1:39:42 EDT, 02/11/19 1:39:42 EDT   ED Secondary Triage Completed 02/11/19 1:39:42 EDT, 02/11/19 1:39:42 EDT   ED Triage Adult Completed 02/11/19 1:30:14 EDT, 02/11/19 1:30:14 EDT   Wound Care Completed 02/11/19 1:57:00 EDT, 02/11/19 1:57:00 EDT, Clean and dress wound(s)        ---------------------------------------------------------------------------------------------------------------------  [facility] allows you to manage your health, view your test results, and retrieve your discharge documents from your hospital stay securely and conveniently from your computer.     To begin the enrollment process, visit [email or web address] Click on "Sign up now" under [facility].   Comment:

## 2019-02-11 NOTE — ED Notes (Signed)
 ED Triage Note       ED Secondary Triage Entered On:  02/11/2019 1:40 EDT    Performed On:  02/11/2019 1:39 EDT by Gerhard Patron               General Information   Barriers to Learning :   Lack of insight   Languages :   English   Pt. Currently Receiving Radiation :   No   Influenza Vaccine Status :   Not received   ED Home Meds Section :   Document assessment   [facility] ED Fall Risk Section :   Document assessment   ED History Section :   Document assessment   ED Advance Directives Section :   Document assessment   Gerhard Patron - 02/11/2019 1:39 EDT   (As Of: 02/11/2019 01:40:54 EDT)   Problems(Active)    HTN (SNOMED CT  :AZEGxwEmLzUcjRiawKgAAg )  Name of Problem:   HTN ; Recorder:   CABE, RN, ADAM Z; Confirmation:   Confirmed ; Classification:   Medical ; Code:   AZEGxwEmLzUcjRiawKgAAg ; Contributor System:   PowerChart ; Last Updated:   09/18/2016 13:23 EST ; Life Cycle Date:   09/18/2016 ; Life Cycle Status:   Active ; Vocabulary:   SNOMED CT        Hypercholesteremia (IMO  :53324 )  Name of Problem:   Hypercholesteremia ; Recorder:   CABE, RN, ADAM Z; Confirmation:   Confirmed ; Classification:   Medical ; Code:   53324 ; Contributor System:   PowerChart ; Last Updated:   09/18/2016 13:23 EST ; Life Cycle Date:   09/18/2016 ; Life Cycle Status:   Active ; Vocabulary:   IMO        Hypothyroid (IMO  :B9796911 )  Name of Problem:   Hypothyroid ; Recorder:   MALONE, RN, JENNIFER L; Confirmation:   Confirmed ; Classification:   Medical ; Code:   [id number] ; Contributor System:   PowerChart ; Last Updated:   09/19/2016 14:07 EST ; Life Cycle Date:   09/19/2016 ; Life Cycle Status:   Active ; Vocabulary:   IMO          Diagnoses(Active)    Abdominal pain  Date:   02/11/2019 ; Diagnosis Type:   Reason For Visit ; Confirmation:   Complaint of ; Clinical Dx:   Abdominal pain ; Classification:   Medical ; Clinical Service:   Non-Specified ; Code:   PNED ; Probability:   0 ; Diagnosis Code:    [id number]             -    Procedure History   (As Of: 02/11/2019 01:40:54 EDT)     Anesthesia Minutes:   0 ; Procedure Name:   VP shunt ; Procedure Minutes:   0            Anesthesia Minutes:   0 ; Procedure Name:   Cholecystectomy ; Procedure Minutes:   0            [facility] Fall Risk Assessment Tool   Hx of falling last 3 months ED Fall :   No   Patient confused or disoriented ED Fall :   No   Patient intoxicated or sedated ED Fall :   No   Patient impaired gait ED Fall :   No   Use a mobility assistance device ED Fall :   No   Patient altered elimination ED Fall :  No   [facility] ED Fall Score :   0    Gerhard Patron - 02/11/2019 1:39 EDT   ED Advance Directive   Advance Directive :   No   Gerhard Patron - 02/11/2019 1:39 EDT   Social History   Social History   (As Of: 02/11/2019 01:40:54 EDT)   Tobacco:        Former smoker, Cigarettes   Comments:  09/29/2016 2:36 - ALBENESIUS, RN, KRISTIN: teenage years.   (Last Updated: 09/29/2016 02:36:19 EST by BELLE, RN, KRISTIN)            Med Hx   Medication List   (As Of: 02/11/2019 01:40:54 EDT)   Prescription/Discharge Order    ibuprofen  :   ibuprofen ; Status:   Prescribed ; Ordered As Mnemonic:   ibuprofen 800 mg oral tablet ; Simple Display Line:   800 mg, 1 tabs, Oral, q8hr, for 5 days, PRN: moderate pain (4-7), 15 tabs, 0 Refill(s) ; Ordering Provider:   LAURETHA NIEMANN; Catalog Code:   ibuprofen ; Order Dt/Tm:   02/06/2019 20:06:17 EDT          lidocaine topical  :   lidocaine topical ; Status:   Prescribed ; Ordered As Mnemonic:   Lidoderm 5% topical film ; Simple Display Line:   1 patches, Topical, Daily, for 10 days, remove patches after 12 hours, 10 patches, 0 Refill(s) ; Ordering Provider:   LAURETHA NIEMANN; Catalog Code:   lidocaine topical ; Order Dt/Tm:   02/06/2019 79:93:73 EDT          dicyclomine  :   dicyclomine ; Status:   Prescribed ; Ordered As Mnemonic:   Bentyl 20 mg oral tablet ; Simple Display  Line:   20 mg, 1 tabs, Oral, QID, for 5 days, PRN: mild pain (1-3), 20 tabs, 0 Refill(s) ; Ordering Provider:   MARIAH SELINDA LABOR; Catalog Code:   dicyclomine ; Order Dt/Tm:   01/04/2019 11:56:50 EDT ; Comment:   for abdominal pain          ondansetron  :   ondansetron ; Status:   Prescribed ; Ordered As Mnemonic:   ondansetron 4 mg oral tablet ; Simple Display Line:   4 mg, 1 tabs, Oral, q6hr, for 3 days, PRN: as needed for nausea/vomiting, 20 tabs, 0 Refill(s) ; Ordering Provider:   MARIAH SELINDA LABOR; Catalog Code:   ondansetron ; Order Dt/Tm:   01/04/2019 11:56:53 EDT          omeprazole  :   omeprazole ; Status:   Prescribed ; Ordered As Mnemonic:   PriLOSEC 20 mg oral delayed release capsule ; Simple Display Line:   20 mg, 1 caps, Oral, Daily, 30 caps, 0 Refill(s) ; Ordering Provider:   PRICE-MD,  KEVIN CHARLES; Catalog Code:   omeprazole ; Order Dt/Tm:   02/19/2018 23:32:47 EDT          meloxicam  :   meloxicam ; Status:   Prescribed ; Ordered As Mnemonic:   meloxicam 7.5 mg oral tablet ; Simple Display Line:   7.5 mg, 1 tabs, Oral, Daily, pain, PRN: other (see comment), 30 tabs, 0 Refill(s) ; Ordering Provider:   CANDIDA ALM NED; Catalog Code:   meloxicam ; Order Dt/Tm:   09/18/2016 16:55:32 EST            Home Meds    acetaminophen  :   acetaminophen ; Status:   Documented ;  Ordered As Mnemonic:   Tylenol Extra Strength 500 mg oral tablet ; Simple Display Line:   mg, tabs, Oral, q6hr, 0 Refill(s) ; Catalog Code:   acetaminophen ; Order Dt/Tm:   01/04/2019 10:30:40 EDT          cyclobenzaprine  :   cyclobenzaprine ; Status:   Documented ; Ordered As Mnemonic:   cyclobenzaprine 10 mg oral tablet ; Simple Display Line:   10 mg, 1 tabs, Oral, TID, PRN, 30 tabs, 0 Refill(s) ; Catalog Code:   cyclobenzaprine ; Order Dt/Tm:   09/29/2016 04:32:44 EST ; Comment:    THIS MEDICATION IS ASSOCIATED   WITH   AN INCREASED RISK OF FALLS.          atenolol  :   atenolol ; Status:   Documented ; Ordered As Mnemonic:    atenolol 100 mg oral tablet ; Simple Display Line:   100 mg, 1 tabs, Oral, Daily, 30 tabs, 0 Refill(s) ; Catalog Code:   atenolol ; Order Dt/Tm:   09/19/2016 14:08:51 EST          fluticasone nasal  :   fluticasone nasal ; Status:   Documented ; Ordered As Mnemonic:   fluticasone 50 mcg/inh nasal spray ; Simple Display Line:   1 sprays, Nasal, BID, 16 g, 0 Refill(s) ; Catalog Code:   fluticasone nasal ; Order Dt/Tm:   09/19/2016 14:08:51 EST          levothyroxine  :   levothyroxine ; Status:   Documented ; Ordered As Mnemonic:   levothyroxine 75 mcg (0.075 mg) oral tablet ; Simple Display Line:   75 mcg, 1 tabs, Oral, Daily, 30 tabs, 0 Refill(s) ; Catalog Code:   levothyroxine ; Order Dt/Tm:   09/19/2016 14:08:51 EST          lisinopril  :   lisinopril ; Status:   Documented ; Ordered As Mnemonic:   lisinopril 40 mg oral tablet ; Simple Display Line:   40 mg, 1 tabs, Oral, Daily, 30 tabs, 0 Refill(s) ; Catalog Code:   lisinopril ; Order Dt/Tm:   09/19/2016 14:08:51 EST          oxybutynin  :   oxybutynin ; Status:   Documented ; Ordered As Mnemonic:   oxybutynin 5 mg/24 hours oral tablet, extended release ; Simple Display Line:   5 mg, 1 tabs, Oral, Daily, 30 tabs, 0 Refill(s) ; Catalog Code:   oxybutynin ; Order Dt/Tm:   09/19/2016 14:08:51 EST          simvastatin  :   simvastatin ; Status:   Documented ; Ordered As Mnemonic:   simvastatin 20 mg oral tablet ; Simple Display Line:   20 mg, 1 tabs, Oral, Once a Day (at bedtime), 0 Refill(s) ; Catalog Code:   simvastatin ; Order Dt/Tm:   09/19/2016 14:08:51 EST

## 2019-02-11 NOTE — Discharge Summary (Signed)
 ED Clinical Summary                     [facility]  [address]  [place], [place] [address]  [phone]          PERSON INFORMATION  Name: Kaitlin Porter, Kaitlin Porter Age:  53 Years DOB: 1966-06-22   Sex: Female Language: English PCP: MELISSA SAVANT   Marital Status: Single Phone: [phone] Med Service: MED-Medicine   MRN: [id number] Acct# [id number] Arrival: 02/11/2019 01:30:00   Visit Reason: UC - Back Pain; Abdominal pain; MULTIPLE COMPLAINTS Acuity: 3 LOS: 000 01:41   Address:    [address] [place] [address]   Diagnosis:    Chronic low back pain; Right hip pain  Medications:          New Medications  Printed Prescriptions  predniSONE (predniSONE 20 mg oral tablet) 2 Tabs Oral (given by mouth) every day for 5 Days. Refills: 0.  Last Dose:____________________  Medications That Were Updated - Follow Current Instructions  Printed Prescriptions  Current lidocaine topical (Lidoderm 5% topical film) 1 Patches Topical (on the skin) every day for 10 Days. remove patches after 12 hours. Refills: 0.  Last Dose:____________________    Other Medications  Current lidocaine topical (Lidoderm 5% topical film) 1 Patches Topical (on the skin) every day for 10 Days. remove patches after 12 hours. Refills: 0.  Last Dose:____________________    Medications that have not changed  Other Medications  acetaminophen (Tylenol Extra Strength 500 mg oral tablet) Oral (given by mouth) every 6 hours.  Last Dose:____________________  atenolol (atenolol 100 mg oral tablet) 1 Tabs Oral (given by mouth) every day.  Last Dose:____________________  cyclobenzaprine (cyclobenzaprine 10 mg oral tablet) 1 Tabs Oral (given by mouth) 3 times a day as needed.,  THIS MEDICATION IS ASSOCIATED  WITH  AN INCREASED RISK OF FALLS.  Last Dose:____________________  dicyclomine (Bentyl 20 mg oral tablet) 1 Tabs Oral (given by mouth) 4 times a day as needed mild pain (1-3) for 5 Days. Refills: 0., for abdominal pain  Last  Dose:____________________  fluticasone nasal (fluticasone 50 mcg/inh nasal spray) 1 Sprays Nasal (into the nose) 2 times a day.  Last Dose:____________________  ibuprofen (ibuprofen 800 mg oral tablet) 1 Tabs Oral (given by mouth) every 8 hours as needed moderate pain (4-7) for 5 Days. Refills: 0.  Last Dose:____________________  levothyroxine (levothyroxine 75 mcg (0.075 mg) oral tablet) 1 Tabs Oral (given by mouth) every day.  Last Dose:____________________  lisinopril (lisinopril 40 mg oral tablet) 1 Tabs Oral (given by mouth) every day.  Last Dose:____________________  meloxicam (meloxicam 7.5 mg oral tablet) 1 Tabs Oral (given by mouth) every day as needed other (see comment). pain. Refills: 0.  Last Dose:____________________  omeprazole (PriLOSEC 20 mg oral delayed release capsule) 1 Capsules Oral (given by mouth) every day. Refills: 0.  Last Dose:____________________  ondansetron (ondansetron 4 mg oral tablet) 1 Tabs Oral (given by mouth) every 6 hours as needed as needed for nausea/vomiting for 3 Days. Refills: 0.  Last Dose:____________________  oxybutynin (oxybutynin 5 mg/24 hours oral tablet, extended release) 5 Milligram Oral (given by mouth) every day.  Last Dose:____________________  simvastatin (simvastatin 20 mg oral tablet) 1 Tabs Oral (given by mouth) Once a Day (at bedtime).  Last Dose:____________________      Medications Administered During Visit:                Medication Dose Route  ketorolac 15 mg IV Push   ketorolac 15 mg IV Push   dexamethasone 10 mg IV Push               Allergies      Lipitor      Major Tests and Procedures:  The following procedures and tests were performed during your ED visit.  COMMON PROCEDURES%>  COMMON PROCEDURES COMMENTS%>                PROVIDER INFORMATION               Provider Role Assigned Sampson LAURETHA NIEMANN ED MidLevel 02/11/2019 01:31:13    Bradly, RN, [place] A ED Nurse 02/11/2019 01:37:07        Attending Physician:  LAURETHA NIEMANN       Admit Doc  MALONE-PA,  THERESA     Consulting Doc       VITALS INFORMATION  Vital Sign Triage Latest   Temp Oral ORAL_1%> ORAL%>   Temp Temporal TEMPORAL_1%> TEMPORAL%>   Temp Intravascular INTRAVASCULAR_1%> INTRAVASCULAR%>   Temp Axillary AXILLARY_1%> AXILLARY%>   Temp Rectal RECTAL_1%> RECTAL%>   02 Sat 100 % 100 %   Respiratory Rate RATE_1%> RATE%>   Peripheral Pulse Rate PULSE RATE_1%> PULSE RATE%>   Apical Heart Rate HEART RATE_1%> HEART RATE%>   Blood Pressure BLOOD PRESSURE_1%>/ BLOOD PRESSURE_1%>81 mmHg BLOOD PRESSURE%> / BLOOD PRESSURE%>76 mmHg                 Immunizations      No Immunizations Documented This Visit          DISCHARGE INFORMATION   Discharge Disposition: H Outpt-Sent Home   Discharge Location:  Home   Discharge Date and Time:  02/11/2019 03:11:23   ED Checkout Date and Time:  02/11/2019 03:11:23     DEPART REASON INCOMPLETE INFORMATION               Depart Action Incomplete Reason   Interactive View/I&O Recently assessed               Problems      Active           Hypothyroid          Hypercholesteremia          HTN              Smoking Status      Former smoker         PATIENT EDUCATION INFORMATION  Instructions:     Degenerative Disk Disease     Follow up:                   With: Address: When:   NORLEEN MALLORY, Physician - Physical Medicine Rehab [address] [place], [place] [address]  [phone] Business (1) Within 5 to 7 days   Comments:   Return to ED if symptoms worsen       With: Address: When:   HARRY RUDOLPH-MD, Physician - Orthopedics [address] [place] [address]  [phone] Business (1) Within 5 to 7 days              ED PROVIDER DOCUMENTATION

## 2019-02-11 NOTE — ED Notes (Signed)
ED Patient Education Note     Patient Education Materials Follows:  Musculoskeletal     Degenerative Disk Disease    Degenerative disk disease is a condition caused by the changes that occur in spinal disks as you grow older. Spinal disks are soft and compressible disks located between the bones of your spine (vertebrae). These disks act like shock absorbers. Degenerative disk disease can affect the whole spine. However, the neck and lower back are most commonly affected. Many changes can occur in the spinal disks with aging, such as:     The spinal disks may dry and shrink.     Small tears may occur in the tough, outer covering of the disk (annulus).      The disk space may become smaller due to loss of water.      Abnormal growths in the bone (spurs) may occur. This can put pressure on the nerve roots exiting the spinal canal, causing pain.      The spinal canal may become narrowed.     RISK FACTORS     Being overweight.     Having a family history of degenerative disk disease.     Smoking.     There is increased risk if you are doing heavy lifting or have a sudden injury.     SIGNS AND SYMPTOMS    Symptoms vary from person to person and may include:     Pain that varies in intensity. Some people have no pain, while others have severe pain. The location of the pain depends on the part of your backbone that is affected.     ? You will have neck or arm pain if a disk in the neck area is affected.     ? You will have pain in your back, buttocks, or legs if a disk in the lower back is affected.      Pain that becomes worse while bending, reaching up, or with twisting movements.      Pain that may start gradually and then get worse as time passes. It may also start after a major or minor injury.      Numbness or tingling in the arms or legs.     DIAGNOSIS    Your health care provider will ask you about your symptoms and about activities or habits that may cause the pain. He or she may also ask about any injuries,  diseases, or treatments you have had. Your health care provider will examine you to check for the range of movement that is possible in the affected area, to check for strength in your extremities, and to check for sensation in the areas of the arms and legs supplied by different nerve roots. You may also have:      An X-ray of the spine.     Other imaging tests, such as MRI.     TREATMENT    Your health care provider will advise you on the best plan for treatment. Treatment may include:     Medicines.     Rehabilitation exercises.     HOME CARE INSTRUCTIONS     Follow proper lifting and walking techniques as advised by your health care provider.     Maintain good posture.      Exercise regularly as advised by your health care provider.     Perform relaxation exercises.     Change your sitting, standing, and sleeping habits as advised by your health care provider.       Change positions frequently.      Lose weight or maintain a healthy weight as advised by your health care provider.     Do not use any tobacco products, including cigarettes, chewing tobacco, or electronic cigarettes. If you need help quitting, ask your health care provider.     Wear supportive footwear.     Take medicines only as directed by your health care provider.     SEEK MEDICAL CARE IF:     Your pain does not go away within 1?4 weeks.     You have significant appetite or weight loss.    SEEK IMMEDIATE MEDICAL CARE IF:     Your pain is severe.     You notice weakness in your arms, hands, or legs.     You begin to lose control of your bladder or bowel movements.     You have fevers or night sweats.    MAKE SURE YOU:     Understand these instructions.      Will watch your condition.     Will get help right away if you are not doing well or get worse.    This information is not intended to replace advice given to you by your health care provider. Make sure you discuss any questions you have with your health care provider.    Document Released:  07/16/2007 Document Revised: 10/09/2014 Document Reviewed: 01/20/2014  [employer]vier Interactive Patient Education ?2016 [employer]

## 2019-02-14 LAB — CBC WITH AUTO DIFFERENTIAL
Absolute Baso #: 0 10*3/uL (ref 0.0–0.2)
Absolute Eos #: 0.1 10*3/uL (ref 0.0–0.5)
Absolute Lymph #: 2.8 10*3/uL (ref 1.0–3.2)
Absolute Mono #: 0.9 10*3/uL (ref 0.3–1.0)
Basophils %: 0.4 % (ref 0.0–2.0)
Eosinophils %: 1.5 % (ref 0.0–7.0)
Hematocrit: 34.4 % (ref 34.0–47.0)
Hemoglobin: 10.8 g/dL — ABNORMAL LOW (ref 11.5–15.7)
Immature Grans (Abs): 0 10*3/uL
Immature Granulocytes: 0.3 %
Lymphocytes: 37.1 % (ref 15.0–45.0)
MCH: 30.1 pg (ref 27.0–34.5)
MCHC: 31.4 g/dL — ABNORMAL LOW (ref 32.0–36.0)
MCV: 95.8 fL (ref 81.0–99.0)
MPV: 11.5 fL (ref 7.2–13.2)
Monocytes: 11.2 % (ref 4.0–12.0)
NRBC Absolute: 0 10*3/uL
NRBC Automated: 0 %
Neutrophils %: 49.5 % (ref 42.0–74.0)
Neutrophils Absolute: 3.8 10*3/uL (ref 1.6–7.3)
Platelets: 249 10*3/uL (ref 140–440)
RBC: 3.59 x10e6/mcL — ABNORMAL LOW (ref 3.60–5.20)
RDW: 15 % (ref 11.0–16.0)
WBC: 7.6 10*3/uL (ref 3.8–10.6)

## 2019-02-14 LAB — POC URINALYSIS, CHEMISTRY
Bilirubin, Urine, POC: NEGATIVE
Blood, UA POC: NEGATIVE
Glucose, UA POC: NEGATIVE mg/dL
Ketones, Urine, POC: NEGATIVE mg/dL
Leukocytes, UA: NEGATIVE
Nitrate, UA POC: NEGATIVE
Protein, Urine, POC: NEGATIVE
Specific Gravity, Urine, POC: 1.02 (ref 1.003–1.035)
UROBILIN U POC: 0.2 EU/dL
pH, Urine, POC: 6 (ref 4.5–8.0)

## 2019-02-14 LAB — BASIC METABOLIC PANEL
Anion Gap: 10 mmol/L (ref 2–17)
BUN: 20 mg/dL (ref 6–20)
CO2: 28 mmol/L (ref 22–29)
Calcium: 9.6 mg/dL (ref 8.6–10.0)
Chloride: 102 mmol/L (ref 98–107)
Creatinine: 0.7 mg/dL (ref 0.5–0.9)
GFR African American: 115 mL/min/{1.73_m2} (ref >=90)
GFR Non-African American: 100 mL/min/{1.73_m2} (ref >=90)
Glucose: 103 mg/dL — ABNORMAL HIGH (ref 70–99)
OSMOLALITY CALCULATED: 282 mOsm/kg (ref 270–287)
Potassium: 4.3 mmol/L (ref 3.5–5.3)
Sodium: 140 mmol/L (ref 135–145)

## 2019-02-14 LAB — HEPATIC FUNCTION PANEL
ALT: 11 U/L (ref 0–33)
AST: 16 U/L (ref 0–32)
Albumin: 4.6 g/dL (ref 3.5–5.2)
Alk Phosphatase: 84 U/L (ref 35–117)
Bilirubin, Direct: <0.2 mg/dL (ref 0.00–0.30)
Total Bilirubin: 0.3 mg/dL (ref 0.00–1.20)
Total Protein: 6.9 g/dL (ref 6.4–8.3)

## 2019-02-14 LAB — LIPASE: Lipase: 15 U/L (ref 13–60)

## 2019-02-14 NOTE — ED Notes (Signed)
 ED Triage Note       ED Secondary Triage Entered On:  02/14/2019 3:13 EDT    Performed On:  02/14/2019 3:12 EDT by Deidra, RN, [place]               General Information   Barriers to Learning :   None evident   ED Home Meds Section :   Document assessment   [facility] ED Fall Risk Section :   Document assessment   ED Advance Directives Section :   Document assessment   Nettles, RN, [place] - 02/14/2019 3:12 EDT   (As Of: 02/14/2019 03:13:11 EDT)   Problems(Active)    HTN (SNOMED CT  :[id number] )  Name of Problem:   HTN ; Recorder:   CABE, RN, ADAM Z; Confirmation:   Confirmed ; Classification:   Medical ; Code:   [id number] ; Contributor System:   PowerChart ; Last Updated:   09/18/2016 13:23 EST ; Life Cycle Date:   09/18/2016 ; Life Cycle Status:   Active ; Vocabulary:   SNOMED CT        Hypercholesteremia (IMO  :53324 )  Name of Problem:   Hypercholesteremia ; Recorder:   CABE, RN, ADAM Z; Confirmation:   Confirmed ; Classification:   Medical ; Code:   53324 ; Contributor System:   PowerChart ; Last Updated:   09/18/2016 13:23 EST ; Life Cycle Date:   09/18/2016 ; Life Cycle Status:   Active ; Vocabulary:   IMO        Hypothyroid (IMO  :[id number] )  Name of Problem:   Hypothyroid ; Recorder:   MALONE, RN, JENNIFER L; Confirmation:   Confirmed ; Classification:   Medical ; Code:   [id number] ; Contributor System:   PowerChart ; Last Updated:   09/19/2016 14:07 EST ; Life Cycle Date:   09/19/2016 ; Life Cycle Status:   Active ; Vocabulary:   IMO          Diagnoses(Active)    Abdominal pain  Date:   02/14/2019 ; Diagnosis Type:   Reason For Visit ; Confirmation:   Complaint of ; Clinical Dx:   Abdominal pain ; Classification:   Medical ; Clinical Service:   Emergency medicine ; Code:   PNED ; Probability:   0 ; Diagnosis Code:   [id number]             -    Procedure History   (As Of: 02/14/2019 03:13:11 EDT)     Anesthesia Minutes:   0 ; Procedure Name:   VP shunt ; Procedure  Minutes:   0            Anesthesia Minutes:   0 ; Procedure Name:   Cholecystectomy ; Procedure Minutes:   0            [facility] Fall Risk Assessment Tool   Hx of falling last 3 months ED Fall :   No   Patient confused or disoriented ED Fall :   No   Patient intoxicated or sedated ED Fall :   No   Patient impaired gait ED Fall :   No   Use a mobility assistance device ED Fall :   No   Patient altered elimination ED Fall :   No   [facility] ED Fall Score :   0    Nettles, RN, [place] - 02/14/2019 3:12 EDT   ED Advance Directive   Advance Directive :   No   Nettles,  RN, [place] - 02/14/2019 3:12 EDT   Med Hx   Medication List   (As Of: 02/14/2019 03:13:11 EDT)   Prescription/Discharge Order    lidocaine topical  :   lidocaine topical ; Status:   Prescribed ; Ordered As Mnemonic:   Lidoderm 5% topical film ; Simple Display Line:   1 patches, Topical, Daily, for 10 days, remove patches after 12 hours, 10 patches, 0 Refill(s) ; Ordering Provider:   LAURETHA NIEMANN; Catalog Code:   lidocaine topical ; Order Dt/Tm:   02/11/2019 03:05:01 EDT          predniSONE  :   predniSONE ; Status:   Prescribed ; Ordered As Mnemonic:   predniSONE 20 mg oral tablet ; Simple Display Line:   40 mg, 2 tabs, Oral, Daily, for 5 days, 10 tabs, 0 Refill(s) ; Ordering Provider:   LAURETHA NIEMANN; Catalog Code:   predniSONE ; Order Dt/Tm:   02/11/2019 03:04:53 EDT          lidocaine topical  :   lidocaine topical ; Status:   Prescribed ; Ordered As Mnemonic:   Lidoderm 5% topical film ; Simple Display Line:   1 patches, Topical, Daily, for 10 days, remove patches after 12 hours, 10 patches, 0 Refill(s) ; Ordering Provider:   LAURETHA NIEMANN; Catalog Code:   lidocaine topical ; Order Dt/Tm:   02/06/2019 79:93:73 EDT          dicyclomine  :   dicyclomine ; Status:   Prescribed ; Ordered As Mnemonic:   Bentyl 20 mg oral tablet ; Simple Display Line:   20 mg, 1 tabs, Oral, QID, for 5 days, PRN: mild pain (1-3), 20 tabs, 0 Refill(s) ; Ordering  Provider:   MARIAH SELINDA LABOR; Catalog Code:   dicyclomine ; Order Dt/Tm:   01/04/2019 11:56:50 EDT ; Comment:   for abdominal pain          ondansetron  :   ondansetron ; Status:   Prescribed ; Ordered As Mnemonic:   ondansetron 4 mg oral tablet ; Simple Display Line:   4 mg, 1 tabs, Oral, q6hr, for 3 days, PRN: as needed for nausea/vomiting, 20 tabs, 0 Refill(s) ; Ordering Provider:   MARIAH SELINDA LABOR; Catalog Code:   ondansetron ; Order Dt/Tm:   01/04/2019 11:56:53 EDT          omeprazole  :   omeprazole ; Status:   Prescribed ; Ordered As Mnemonic:   PriLOSEC 20 mg oral delayed release capsule ; Simple Display Line:   20 mg, 1 caps, Oral, Daily, 30 caps, 0 Refill(s) ; Ordering Provider:   PRICE-MD,  KEVIN CHARLES; Catalog Code:   omeprazole ; Order Dt/Tm:   02/19/2018 23:32:47 EDT          meloxicam  :   meloxicam ; Status:   Prescribed ; Ordered As Mnemonic:   meloxicam 7.5 mg oral tablet ; Simple Display Line:   7.5 mg, 1 tabs, Oral, Daily, pain, PRN: other (see comment), 30 tabs, 0 Refill(s) ; Ordering Provider:   CANDIDA ALM NED; Catalog Code:   meloxicam ; Order Dt/Tm:   09/18/2016 16:55:32 EST            Home Meds    acetaminophen  :   acetaminophen ; Status:   Documented ; Ordered As Mnemonic:   Tylenol Extra Strength 500 mg oral tablet ; Simple Display Line:   mg, tabs, Oral, q6hr, 0 Refill(s) ;  Catalog Code:   acetaminophen ; Order Dt/Tm:   01/04/2019 10:30:40 EDT          cyclobenzaprine  :   cyclobenzaprine ; Status:   Documented ; Ordered As Mnemonic:   cyclobenzaprine 10 mg oral tablet ; Simple Display Line:   10 mg, 1 tabs, Oral, TID, PRN, 30 tabs, 0 Refill(s) ; Catalog Code:   cyclobenzaprine ; Order Dt/Tm:   09/29/2016 04:32:44 EST ; Comment:    THIS MEDICATION IS ASSOCIATED   WITH   AN INCREASED RISK OF FALLS.          atenolol  :   atenolol ; Status:   Documented ; Ordered As Mnemonic:   atenolol 100 mg oral tablet ; Simple Display Line:   100 mg, 1 tabs, Oral, Daily, 30 tabs, 0 Refill(s)  ; Catalog Code:   atenolol ; Order Dt/Tm:   09/19/2016 14:08:51 EST          fluticasone nasal  :   fluticasone nasal ; Status:   Documented ; Ordered As Mnemonic:   fluticasone 50 mcg/inh nasal spray ; Simple Display Line:   1 sprays, Nasal, BID, 16 g, 0 Refill(s) ; Catalog Code:   fluticasone nasal ; Order Dt/Tm:   09/19/2016 14:08:51 EST          levothyroxine  :   levothyroxine ; Status:   Documented ; Ordered As Mnemonic:   levothyroxine 75 mcg (0.075 mg) oral tablet ; Simple Display Line:   75 mcg, 1 tabs, Oral, Daily, 30 tabs, 0 Refill(s) ; Catalog Code:   levothyroxine ; Order Dt/Tm:   09/19/2016 14:08:51 EST          lisinopril  :   lisinopril ; Status:   Documented ; Ordered As Mnemonic:   lisinopril 40 mg oral tablet ; Simple Display Line:   40 mg, 1 tabs, Oral, Daily, 30 tabs, 0 Refill(s) ; Catalog Code:   lisinopril ; Order Dt/Tm:   09/19/2016 14:08:51 EST          oxybutynin  :   oxybutynin ; Status:   Documented ; Ordered As Mnemonic:   oxybutynin 5 mg/24 hours oral tablet, extended release ; Simple Display Line:   5 mg, 1 tabs, Oral, Daily, 30 tabs, 0 Refill(s) ; Catalog Code:   oxybutynin ; Order Dt/Tm:   09/19/2016 14:08:51 EST          simvastatin  :   simvastatin ; Status:   Documented ; Ordered As Mnemonic:   simvastatin 20 mg oral tablet ; Simple Display Line:   20 mg, 1 tabs, Oral, Once a Day (at bedtime), 0 Refill(s) ; Catalog Code:   simvastatin ; Order Dt/Tm:   09/19/2016 14:08:51 EST

## 2019-02-14 NOTE — ED Notes (Signed)
 ED Patient Summary       ;       [facility] ([place]) Emergency Department  [address] [place] [address]  [phone]  Discharge Instructions (Patient)  _______________________________________     Name: Kaitlin Porter, Kaitlin Porter  DOB: [birth year]                   MRN: [id number]                   FIN: [id number]  Reason For Visit: Abdominal pain; ABD PAIN  Final Diagnosis: Constipated; Superficial foreign body of unspecified part of head, initial encounter     Visit Date: 02/14/2019 03:02:00  Address: [address] [place] [address]  Phone: [phone]     Primary Care Provider:      Name: MELISSA SAVANT      Phone: [phone]        Emergency Department Providers:         Primary Physician:   KING-MD, MELANIE ELAINE         [facility] would like to thank you for allowing us  to assist you with your healthcare needs. The following includes patient education materials and information regarding your injury/illness.     Follow-up Instructions: You were treated today on an emergency basis, it may be wise to contact your primary care provider to notify them of your visit today. You may have been referred to your regular doctor or a specialist, please follow up as instructed. If your condition worsens or you can't get in to see the doctor, contact the Emergency Department.              With: Address: When:   Follow up with primary care provider  Within 1 week       With: Address: When:   Follow up with primary care provider  Within 1 week   Comments:   Return to ED if symptoms worsen              Printed Prescriptions:    Patient Education Materials:  Discharge Orders          Discharge Patient 02/14/19 7:22:00 EDT  Discharge Patient 02/14/19 7:23:00 EDT         Comment:      Constipation, Adult, Easy-to-Read; Constipation, Adult     Constipation, Adult    Constipation is when a person:     Poops (has a bowel movement) less than 3 times a week.     Has a hard time pooping.     Has poop  that is dry, hard, or bigger than normal.    HOME CARE     Eat foods with a lot of fiber in them. This includes fruits, vegetables, beans, and whole grains such as brown rice.        Avoid fatty foods and foods with a lot of sugar. This includes french fries, hamburgers, cookies, candy, and soda.      If you are not getting enough fiber from food, take products with added fiber in them (supplements).      Drink enough fluid to keep your pee (urine) clear or pale yellow.     Exercise on a regular basis, or as told by your doctor.      Go to the restroom when you feel like you need to poop. Do not hold it.      Only take medicine as told by your doctor.  Do not take medicines that help you poop (laxatives) without talking to your doctor first.     GET HELP RIGHT AWAY IF:     You have bright red blood in your poop (stool).     Your constipation lasts more than 4 days or gets worse.     You have belly (abdominal) or butt (rectal) pain.     You have thin poop (as thin as a pencil).     You lose weight, and it cannot be explained.    MAKE SURE YOU:     Understand these instructions.      Will watch your condition.     Will get help right away if you are not doing well or get worse.    This information is not intended to replace advice given to you by your health care provider. Make sure you discuss any questions you have with your health care provider.    Document Released: 03/06/2008 Document Revised: 10/09/2014 Document Reviewed: 06/30/2013  Elsevier Interactive Patient Education ?2016 [employer]       Constipation, Adult    Constipation is when a person has fewer than three bowel movements a week, has difficulty having a bowel movement, or has stools that are dry, hard, or larger than normal. As people grow older, constipation is more common.  A low-fiber diet, not taking in enough fluids, and taking certain medicines may make constipation worse.     CAUSES     Certain medicines, such as antidepressants, pain  medicine, iron supplements, antacids, and water pills. ?     Certain diseases, such as diabetes, irritable bowel syndrome (IBS), thyroid disease, or depression. ?     Not drinking enough water. ?     Not eating enough fiber-rich foods. ?     Stress or travel. ?     Lack of physical activity or exercise. ?     Ignoring the urge to have a bowel movement. ?     Using laxatives too much. ?    SIGNS AND SYMPTOMS     Having fewer than three bowel movements a week. ?     Straining to have a bowel movement. ?     Having stools that are hard, dry, or larger than normal. ?     Feeling full or bloated. ?     Pain in the lower abdomen. ?     Not feeling relief after having a bowel movement. ?    DIAGNOSIS    Your health care provider will take a medical history and perform a physical exam. Further testing may be done for severe constipation. Some tests may include:     A barium enema X-ray to examine your rectum, colon, and, sometimes, your small intestine. ?     A sigmoidoscopy to examine your lower colon. ?     A colonoscopy to examine your entire colon.     TREATMENT    Treatment will depend on the severity of your constipation and what is causing it. Some dietary treatments include drinking more fluids and eating more fiber-rich foods. Lifestyle treatments may include regular exercise. If these diet and lifestyle recommendations do not help, your health care provider may recommend taking over-the-counter laxative medicines to help you have bowel movements. Prescription medicines may be prescribed if over-the-counter medicines do not work.     HOME CARE INSTRUCTIONS     Eat foods that have a lot of fiber, such as fruits, vegetables, whole  grains, and beans.     Limit foods high in fat and processed sugars, such as french fries, hamburgers, cookies, candies, and soda. ?     A fiber supplement may be added to your diet if you cannot get enough fiber from foods. ?     Drink enough fluids to keep your urine clear or pale yellow.  ?     Exercise regularly or as directed by your health care provider. ?     Go to the restroom when you have the urge to go. Do not hold it. ?     Only take over-the-counter or prescription medicines as directed by your health care provider. Do not take other medicines for constipation without talking to your health care provider first. ?     SEEK IMMEDIATE MEDICAL CARE IF:     You have bright red blood in your stool. ?     Your constipation lasts for more than 4 days or gets worse. ?     You have abdominal or rectal pain. ?     You have thin, pencil-like stools. ?     You have unexplained weight loss.     MAKE SURE YOU:     Understand these instructions.     Will watch your condition.     Will get help right away if you are not doing well or get worse.    This information is not intended to replace advice given to you by your health care provider. Make sure you discuss any questions you have with your health care provider.    Document Released: 06/16/2004 Document Revised: 10/09/2014 Document Reviewed: 06/30/2013  Elsevier Interactive Patient Education ?2016 Elsevier Inc.         Allergy Info: Lipitor     Medication Information:  [facility] you with a complete list of medications post discharge, if you have been instructed to stop taking a medication please ensure you also follow up with this information to your Primary Care Physician.  Unless otherwise noted, patient will continue to take medications as prescribed prior to the Emergency Room visit.  Any specific questions regarding your chronic medications and dosages should be discussed with your physician(s) and pharmacist.          acetaminophen (Tylenol Extra Strength 500 mg oral tablet) Oral (given by mouth) every 6 hours.  atenolol (atenolol 100 mg oral tablet) 1 Tabs Oral (given by mouth) every day.  cyclobenzaprine (cyclobenzaprine 10 mg oral tablet) 1 Tabs Oral (given by mouth) 3 times a day as needed.,  THIS MEDICATION  IS ASSOCIATED  WITH  AN INCREASED RISK OF FALLS.  dicyclomine (Bentyl 20 mg oral tablet) 1 Tabs Oral (given by mouth) 4 times a day as needed mild pain (1-3) for 5 Days. Refills: 0., for abdominal pain  docusate (Colace 50 mg oral capsule) 1 Capsules Oral (given by mouth) 2 times a day as needed for constipation. Refills: 0.  fluticasone nasal (fluticasone 50 mcg/inh nasal spray) 1 Sprays Nasal (into the nose) 2 times a day.  levothyroxine (levothyroxine 75 mcg (0.075 mg) oral tablet) 1 Tabs Oral (given by mouth) every day.  lidocaine topical (Lidoderm 5% topical film) 1 Patches Topical (on the skin) every day for 10 Days. remove patches after 12 hours. Refills: 0.  lidocaine topical (Lidoderm 5% topical film) 1 Patches Topical (on the skin) every day for 10 Days. remove patches after 12 hours. Refills: 0.  lisinopril (lisinopril 40 mg  oral tablet) 1 Tabs Oral (given by mouth) every day.  meloxicam (meloxicam 7.5 mg oral tablet) 1 Tabs Oral (given by mouth) every day as needed other (see comment). pain. Refills: 0.  omeprazole (PriLOSEC 20 mg oral delayed release capsule) 1 Capsules Oral (given by mouth) every day. Refills: 0.  ondansetron (ondansetron 4 mg oral tablet) 1 Tabs Oral (given by mouth) every 6 hours as needed as needed for nausea/vomiting for 3 Days. Refills: 0.  oxybutynin (oxybutynin 5 mg/24 hours oral tablet, extended release) 5 Milligram Oral (given by mouth) every day.  predniSONE (predniSONE 20 mg oral tablet) 2 Tabs Oral (given by mouth) every day for 5 Days. Refills: 0.  simvastatin (simvastatin 20 mg oral tablet) 1 Tabs Oral (given by mouth) Once a Day (at bedtime).      Medications Administered During Visit:              Medication Dose Route   Sodium Chloride 0.9% 1000 mL IV Piggyback   ondansetron 4 mg IV Push   morphine 4 mg IV Push          Major Tests and Procedures:  The following procedures and tests were performed during your ED visit.  COMMON PROCEDURES%>  COMMON PROCEDURES  COMMENTS%>          Laboratory Orders  Name Status Details   .UA POC Completed Urine, RT, RT - Routine, Collected, 02/14/19 6:29:00 EDT, Nurse collect, 02/14/19 6:29:00 US Robinette, RAL POC Login   BMP Completed Blood, Stat, ST - Stat, 02/14/19 3:18:00 EDT, 02/14/19 3:18:00 EDT, Nurse collect, MILLER-DO,  MEGAN E, Print label Y/N   CBCDIFF Completed Blood, Stat, ST - Stat, 02/14/19 3:18:00 EDT, 02/14/19 3:18:00 EDT, Nurse collect, MILLER-DO,  MEGAN E, Print label Y/N   Hepatic Completed Blood, Stat, ST - Stat, 02/14/19 3:18:00 EDT, 02/14/19 3:18:00 EDT, Nurse collect, MILLER-DO,  MEGAN E, Print label Y/N   Lipase Lvl Completed Blood, Stat, ST - Stat, 02/14/19 3:18:00 EDT, 02/14/19 3:18:00 EDT, Nurse collect, MILLER-DO,  MEGAN E, Print label Y/N   XTube Blue Completed Blood, Stat, ST - Stat, Collected, 02/14/19 3:29:00 EDT [id number], 02/14/19 3:29:00 EDT, Nurse collect, Venous Draw, 02/14/19 3:46:00 EDT, SF CP Login, MILLER-DO,  MEGAN E, Print label Y/N, [email or web address], 1.8 mL Aolz/*[id number]*/, Complete               Radiology Orders  Name Status Details   CT Abdomen Pelvis w/o Contrast Ordered 02/14/19 4:22:00 EDT, STAT 1 hour or less, Reason: Flank pain, stone disease suspected, Transport Mode: STRETCHER, pp_set_radiology_subspecialty   CT Head or Brain w/o Contrast Completed 02/14/19 5:14:00 EDT, STAT 1 hour or less, Reason: Other (please specify), Reason: vp shunt evaluation, Transport Mode: STRETCHER, Rad Type, pp_set_radiology_subspecialty   XR Abdomen 2 Views Ordered 02/14/19 5:30:00 EDT, Routine 24 hours from time of order, Reason: Other (please specify), Reason: Shunt Evaluation, Transport Mode: STRETCHER, pp_set_radiology_subspecialty   XR Chest 2 Views Ordered 02/14/19 5:30:00 EDT, Routine 24 hours from time of order, Reason: Other (please specify), Reason: Shunt Evaluation, Transport Mode: STRETCHER, pp_set_radiology_subspecialty   XR Neck Soft Tissue Ordered 02/14/19 5:30:00 EDT, Routine 24 hours from time  of order, Reason: Other (please specify), Reason: Shunt Evaluation, Transport Mode: STRETCHER, pp_set_radiology_subspecialty   XR Skull < 4 Views Ordered 02/14/19 5:30:00 EDT, Routine 24 hours from time of order, Reason: Other (please specify), Reason: Shunt Evaluation, Transport Mode: STRETCHER, Superficial foreign body of unspecified part of head, initial encounter, pp_set_radiology_subspecialty  Patient Care Orders  Name Status Details   Discharge Patient Ordered 02/14/19 7:22:00 EDT   Discharge Patient Ordered 02/14/19 7:23:00 EDT   ED Assessment Adult Completed 02/14/19 3:12:45 EDT, 02/14/19 3:12:45 EDT   ED Secondary Triage Completed 02/14/19 3:12:45 EDT, 02/14/19 3:12:45 EDT   ED Triage Adult Completed 02/14/19 3:02:31 EDT, 02/14/19 3:02:31 EDT   POC-Urine Dipstick collect Completed 02/14/19 3:18:00 EDT, Once, 02/14/19 3:18:00 EDT   Saline Lock Insert Completed 02/14/19 3:18:00 EDT, Once, 02/14/19 3:18:00 EDT       ---------------------------------------------------------------------------------------------------------------------  [facility] allows you to manage your health, view your test results, and retrieve your discharge documents from your hospital stay securely and conveniently from your computer.     To begin the enrollment process, visit [email or web address] Click on "Sign up now" under [facility].   Comment:

## 2019-02-14 NOTE — Discharge Summary (Signed)
 ED Clinical Summary                     [facility]  [address]  [place], [place] [address]  [id number]          PERSON INFORMATION  Name: Kaitlin Porter, Kaitlin Porter Age:  53 Years DOB: 11-05-1965   Sex: Female Language: English PCP: MELISSA SAVANT   Marital Status: Single Phone: [phone] Med Service: MED-Medicine   MRN: [id number] Acct# [id number] Arrival: 02/14/2019 03:02:00   Visit Reason: Abdominal pain; ABD PAIN Acuity: 3 LOS: 000 04:33   Address:    [address] [place] [address]   Diagnosis:    Constipated; Superficial foreign body of unspecified part of head, initial encounter  Medications:          New Medications  Printed Prescriptions  docusate (Colace 50 mg oral capsule) 1 Capsules Oral (given by mouth) 2 times a day as needed for constipation. Refills: 0.  Last Dose:____________________  Medications that have not changed  Other Medications  acetaminophen (Tylenol Extra Strength 500 mg oral tablet) Oral (given by mouth) every 6 hours.  Last Dose:____________________  atenolol (atenolol 100 mg oral tablet) 1 Tabs Oral (given by mouth) every day.  Last Dose:____________________  cyclobenzaprine (cyclobenzaprine 10 mg oral tablet) 1 Tabs Oral (given by mouth) 3 times a day as needed.,  THIS MEDICATION IS ASSOCIATED  WITH  AN INCREASED RISK OF FALLS.  Last Dose:____________________  dicyclomine (Bentyl 20 mg oral tablet) 1 Tabs Oral (given by mouth) 4 times a day as needed mild pain (1-3) for 5 Days. Refills: 0., for abdominal pain  Last Dose:____________________  fluticasone nasal (fluticasone 50 mcg/inh nasal spray) 1 Sprays Nasal (into the nose) 2 times a day.  Last Dose:____________________  levothyroxine (levothyroxine 75 mcg (0.075 mg) oral tablet) 1 Tabs Oral (given by mouth) every day.  Last Dose:____________________  lidocaine topical (Lidoderm 5% topical film) 1 Patches Topical (on the skin) every day for 10 Days. remove patches after 12 hours. Refills:  0.  Last Dose:____________________  lidocaine topical (Lidoderm 5% topical film) 1 Patches Topical (on the skin) every day for 10 Days. remove patches after 12 hours. Refills: 0.  Last Dose:____________________  lisinopril (lisinopril 40 mg oral tablet) 1 Tabs Oral (given by mouth) every day.  Last Dose:____________________  meloxicam (meloxicam 7.5 mg oral tablet) 1 Tabs Oral (given by mouth) every day as needed other (see comment). pain. Refills: 0.  Last Dose:____________________  omeprazole (PriLOSEC 20 mg oral delayed release capsule) 1 Capsules Oral (given by mouth) every day. Refills: 0.  Last Dose:____________________  ondansetron (ondansetron 4 mg oral tablet) 1 Tabs Oral (given by mouth) every 6 hours as needed as needed for nausea/vomiting for 3 Days. Refills: 0.  Last Dose:____________________  oxybutynin (oxybutynin 5 mg/24 hours oral tablet, extended release) 5 Milligram Oral (given by mouth) every day.  Last Dose:____________________  predniSONE (predniSONE 20 mg oral tablet) 2 Tabs Oral (given by mouth) every day for 5 Days. Refills: 0.  Last Dose:____________________  simvastatin (simvastatin 20 mg oral tablet) 1 Tabs Oral (given by mouth) Once a Day (at bedtime).  Last Dose:____________________      Medications Administered During Visit:                Medication Dose Route   Sodium Chloride 0.9% 1000 mL IV Piggyback   ondansetron 4 mg IV Push   morphine 4 mg IV Push  Allergies      Lipitor      Major Tests and Procedures:  The following procedures and tests were performed during your ED visit.  COMMON PROCEDURES%>  COMMON PROCEDURES COMMENTS%>                PROVIDER INFORMATION               Provider Role Assigned Sampson Seeds, RN, [place] ED Nurse 02/14/2019 03:03:21    TERRESA BOUCHARD E ED Provider 02/14/2019 03:03:55    KARL, ANDREA DIRE ED Provider 02/14/2019 06:33:20    Ina, RN, [place] ED Nurse 02/14/2019 07:08:51        Attending Physician:  TERRESA BOUCHARD E       Admit Doc  MILLER-DO,  MEGAN E     Consulting Doc       VITALS INFORMATION  Vital Sign Triage Latest   Temp Oral ORAL_1%> ORAL%>   Temp Temporal TEMPORAL_1%> TEMPORAL%>   Temp Intravascular INTRAVASCULAR_1%> INTRAVASCULAR%>   Temp Axillary AXILLARY_1%> AXILLARY%>   Temp Rectal RECTAL_1%> RECTAL%>   02 Sat 100 % 100 %   Respiratory Rate RATE_1%> RATE%>   Peripheral Pulse Rate PULSE RATE_1%> PULSE RATE%>   Apical Heart Rate HEART RATE_1%> HEART RATE%>   Blood Pressure BLOOD PRESSURE_1%>/ BLOOD PRESSURE_1%>85 mmHg BLOOD PRESSURE%> / BLOOD PRESSURE%>102 mmHg                 Immunizations      No Immunizations Documented This Visit          DISCHARGE INFORMATION   Discharge Disposition: H Outpt-Sent Home   Discharge Location:  Home   Discharge Date and Time:  02/14/2019 07:35:21   ED Checkout Date and Time:  02/14/2019 07:35:21     DEPART REASON INCOMPLETE INFORMATION               Depart Action Incomplete Reason   Interactive View/I&O Recently assessed               Problems      Active           Hypothyroid          Hypercholesteremia          HTN              Smoking Status      Former smoker         PATIENT EDUCATION INFORMATION  Instructions:     Constipation, Adult, Easy-to-Read; Constipation, Adult     Follow up:                   With: Address: When:   Follow up with primary care provider  Within 1 week       With: Address: When:   Follow up with primary care provider  Within 1 week   Comments:   Return to ED if symptoms worsen              ED PROVIDER DOCUMENTATION     Patient:   Kaitlin Porter             MRN: [id number]            FIN: [phone]               Age:   53 years     Sex:  Female     DOB:  [birth year]   Associated Diagnoses:   Constipated   Author:   TERRESA,  MEGAN E      Basic Information   Time seen: Provider Seen (ST)   ED Provider/Time:    MILLER-DO,  MEGAN E / 02/14/2019 03:03  .   History source: Patient.   Arrival mode: Private vehicle.   History limitation: None.   Additional information:  Chief Complaint from Nursing Triage Note   Chief Complaint  Chief Complaint: pt c/o lower abdominal cramping x2-3 hrs (02/14/19 03:11:00).      History of Present Illness   The patient presents with abdominal pain.  The onset was 2  hours ago.  The course/duration of symptoms is constant.  The character of symptoms is sharp and pressure.  The degree at onset was moderate.  The Location of pain at onset was left, lower, abdominal and flank.  The degree at present is moderate.  The Location of pain at present is left, lower, abdominal and flank.  Radiating pain: none. The exacerbating factor is none.  The relieving factor is none.  Therapy today: none.  Risk factors consist of hypertension.  Associated symptoms: nausea.  Additional history: Patient denies any diarrhea, dysuria, hematuria, vomiting. No prior abdominal surgeries. No recent antibiotics. .        Review of Systems   Constitutional symptoms:  No fever, no chills.    Skin symptoms:  No jaundice, no rash.    Eye symptoms:  Vision unchanged.   ENMT symptoms:  No sore throat,    Respiratory symptoms:  No shortness of breath,    Cardiovascular symptoms:  No chest pain,    Gastrointestinal symptoms:  Abdominal pain, nausea, no vomiting, no diarrhea, no constipation.    Genitourinary symptoms:  No dysuria, no hematuria.    Musculoskeletal symptoms:  No back pain,    Neurologic symptoms:  No headache, no dizziness, no numbness, no tingling, no focal weakness.    Psychiatric symptoms:  No anxiety, no depression.    Hematologic/Lymphatic symptoms:  Bleeding tendency negative,       Health Status   Allergies:    Allergic Reactions (Selected)  Mild  Lipitor- No reactions were documented..   Medications:  (Selected)   Prescriptions  Prescribed  Bentyl 20 mg oral tablet: 20 mg, 1 tabs, Oral, QID, for 5 days, PRN: mild pain (1-3), 20 tabs, 0 Refill(s)  Lidoderm 5% topical film: 1 patches, Topical, Daily, for 10 days, remove patches after 12 hours, 10 patches, 0  Refill(s)  Lidoderm 5% topical film: 1 patches, Topical, Daily, for 10 days, remove patches after 12 hours, 10 patches, 0 Refill(s)  PriLOSEC 20 mg oral delayed release capsule: 20 mg, 1 caps, Oral, Daily, 30 caps, 0 Refill(s)  meloxicam 7.5 mg oral tablet: 7.5 mg, 1 tabs, Oral, Daily, pain, PRN: other (see comment), 30 tabs, 0 Refill(s)  ondansetron 4 mg oral tablet: 4 mg, 1 tabs, Oral, q6hr, for 3 days, PRN: as needed for nausea/vomiting, 20 tabs, 0 Refill(s)  predniSONE 20 mg oral tablet: 40 mg, 2 tabs, Oral, Daily, for 5 days, 10 tabs, 0 Refill(s)  Documented Medications  Documented  Tylenol Extra Strength 500 mg oral tablet: mg, tabs, Oral, q6hr, 0 Refill(s)  atenolol 100 mg oral tablet: 100 mg, 1 tabs, Oral, Daily, 30 tabs, 0 Refill(s)  cyclobenzaprine 10 mg oral tablet: 10 mg, 1 tabs, Oral, TID, PRN, 30 tabs, 0 Refill(s)  fluticasone 50 mcg/inh nasal spray: 1 sprays, Nasal, BID, 16 g, 0 Refill(s)  levothyroxine 75 mcg (0.075 mg) oral tablet: 75 mcg, 1 tabs, Oral, Daily,  30 tabs, 0 Refill(s)  lisinopril 40 mg oral tablet: 40 mg, 1 tabs, Oral, Daily, 30 tabs, 0 Refill(s)  oxybutynin 5 mg/24 hours oral tablet, extended release: 5 mg, 1 tabs, Oral, Daily, 30 tabs, 0 Refill(s)  simvastatin 20 mg oral tablet: 20 mg, 1 tabs, Oral, Once a Day (at bedtime), 0 Refill(s).      Past Medical/ Family/ Social History   Medical history:    No active or resolved past medical history items have been selected or recorded., Reviewed as documented in chart.   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  Cholecystectomy ([id number])., Reviewed as documented in chart.   Family history:    No family history items have been selected or recorded., Reviewed as documented in chart.   Social history:    Social & Psychosocial Habits    Tobacco  09/29/2016  Use: Former smoker    Type: Cigarettes    Comment: teenage years. - 09/29/2016 02:36 - ALBENESIUS, RN, KRISTIN  , Reviewed as documented in chart.   Problem list:    Active Problems  (3)  HTN   Hypercholesteremia   Hypothyroid   , per nurse's notes.   Reviewed and agree with nursing notes regarding past medical/surgical/social history      Physical Examination               Vital Signs   Vital Signs   02/14/2019 3:11 EDT Systolic Blood Pressure 153 mmHg  HI    Diastolic Blood Pressure 85 mmHg    Temperature Oral 36.7 degC    Heart Rate Monitored 85 bpm    Respiratory Rate 16 br/min    SpO2 100 %    Measurements   02/14/2019 3:12 EDT Body Mass Index est meas 25.95 kg/m2    Body Mass Index Measured 25.95 kg/m2   02/14/2019 3:11 EDT Height/Length Measured 170 cm    Weight Dosing 75 kg    Basic Oxygen Information   02/14/2019 3:13 EDT Oxygen Therapy Room air   02/14/2019 3:11 EDT SpO2 100 %    Oxygen Therapy Room air    General:  Alert, no acute distress.    Skin:  Warm, dry, intact.    Head:  Normocephalic, atraumatic.    Neck:  Trachea midline.   Eye:  Normal conjunctiva.   Ears, nose, mouth and throat:  Oral mucosa moist.   Cardiovascular:  Regular rate and rhythm, No murmur.    Respiratory:  Lungs are clear to auscultation, respirations are non-labored.    Gastrointestinal:  Soft, Nontender, Non distended.    Musculoskeletal:  No deformity.   Neurological:  No focal neurological deficit observed.   Psychiatric:  Cooperative, appropriate mood & affect.       Medical Decision Making   Rationale:  Patient is afebrile, nontoxic appearing and in no acute distress. She is not hypoxic on room air. Her abdomen is soft and nontender. Labs unremarkable. CTAP showed shunt with fluid collection in the right subcutaneous tissue questioning malfunction shunt. However on review of prior CT in April this was seen and neurosurgery was consulted and felt fluid collection represented seroma rather than infection. On discussing this with radiologist he then amended his read that this has had minimal change from prior study. Also this does not correlate with location of pain. Because of his initial read I had ordered a  shunt series. After finding that this was seen on prior CT I canceled shunt series, and told this to x-ray technician  Shelby. At the time of the end of my shift patient is still pending urine test. Suspect constipation as etiology of her pain. Care was signed out to oncoming provider Dr. Myrna to follow up urine for likely discharge.   Results review:  Lab results : Lab View   02/14/2019 4:07 EDT Estimated Creatinine Clearance 99.23 mL/min   02/14/2019 3:29 EDT WBC 7.6 x10e3/mcL    RBC 3.59 x10e6/mcL  LOW    Hgb 10.8 g/dL  LOW    HCT 65.5 %    MCV 95.8 fL    MCH 30.1 pg    MCHC 31.4 g/dL  LOW    RDW 84.9 %    Platelet 249 x10e3/mcL    MPV 11.5 fL    Neutro Auto 49.5 %    Neutro Absolute 3.8 x10e3/mcL    Immature Grans Percent 0.3 %  NA    Immature Grans Absolute 0.0 x10e3/mcL  NA    Lymph Auto 37.1 %    Lymph Absolute 2.8 x10e3/mcL    Mono Auto 11.2 %    Mono Absolute 0.9 x10e3/mcL    Eosinophil Percent 1.5 %    Eos Absolute 0.1 x10e3/mcL    Basophil Auto 0.4 %    Baso Absolute 0.0 x10e3/mcL    NRBC Absolute Auto 0.00 x10e3/mcL  NA    NRBC Percent Auto 0.0 %  NA    Sodium Lvl 140 mmol/L    Potassium Lvl 4.3 mmol/L    Chloride 102 mmol/L    CO2 28 mmol/L    Glucose Random 103 mg/dL  HI    BUN 20 mg/dL    Creatinine Lvl 0.7 mg/dL    AGAP 10 mmol/L    Osmolality Calc 282 mOsm/kg    Calcium Lvl 9.6 mg/dL    Protein Total 6.9 g/dL    Albumin Lvl 4.6 g/dL    Alk Phos 84 unit/L    AST 16 unit/L    ALT 11 unit/L    eGFR AA 115 mL/min/1.4m    eGFR Non-AA 100 mL/min/1.37m    Bili Total 0.30 mg/dL    Bili Direct <9.79 mg/dL    Lipase Lvl 15 unit/L   02/14/2019 3:12 EDT Estimated Creatinine Clearance 86.83 mL/min       Reexamination/ Reevaluation   Vital signs   Basic Oxygen Information   02/14/2019 3:13 EDT Oxygen Therapy Room air   02/14/2019 3:11 EDT SpO2 100 %    Oxygen Therapy Room air         Impression and Plan   Diagnosis   Constipated (ICD10-CM K59.00, Discharge, Medical)   Plan   Condition: Stable.    Disposition:  Patient care transitioned to: Time: 02/14/2019 06:38:00, KING-MD,  MELANIE ELAINE.    Patient was given the following educational materials: Constipation, Adult.    Follow up with: Follow up with primary care provider Within 1 week Return to ED if symptoms worsen.

## 2019-02-14 NOTE — ED Provider Notes (Signed)
Abdominal Pain *ED        Patient:   Kaitlin Porter, Kaitlin Porter             MRN: [id number]            FIN: [id number]               Age:   53 years     Sex:  Female     DOB:  Feb 20, 1966   Associated Diagnoses:   Constipated   Author:   Royston Sinner E      Basic Information   Time seen: Provider Seen (ST)   ED Provider/Time:    Scottlynn Lindell-DO,  Lennox Leikam E / 02/14/2019 03:03  .   History source: Patient.   Arrival mode: Private vehicle.   History limitation: None.   Additional information: Chief Complaint from Nursing Triage Note   Chief Complaint  Chief Complaint: pt c/o lower abdominal cramping x2-3 hrs (02/14/19 03:11:00).      History of Present Illness   The patient presents with abdominal pain.  The onset was 2  hours ago.  The course/duration of symptoms is constant.  The character of symptoms is sharp and pressure.  The degree at onset was moderate.  The Location of pain at onset was left, lower, abdominal and flank.  The degree at present is moderate.  The Location of pain at present is left, lower, abdominal and flank.  Radiating pain: none. The exacerbating factor is none.  The relieving factor is none.  Therapy today: none.  Risk factors consist of hypertension.  Associated symptoms: nausea.  Additional history: Patient denies any diarrhea, dysuria, hematuria, vomiting. No prior abdominal surgeries. No recent antibiotics. .        Review of Systems   Constitutional symptoms:  No fever, no chills.    Skin symptoms:  No jaundice, no rash.    Eye symptoms:  Vision unchanged.   ENMT symptoms:  No sore throat,    Respiratory symptoms:  No shortness of breath,    Cardiovascular symptoms:  No chest pain,    Gastrointestinal symptoms:  Abdominal pain, nausea, no vomiting, no diarrhea, no constipation.    Genitourinary symptoms:  No dysuria, no hematuria.    Musculoskeletal symptoms:  No back pain,    Neurologic symptoms:  No headache, no dizziness, no numbness, no tingling, no focal weakness.    Psychiatric symptoms:  No anxiety, no  depression.    Hematologic/Lymphatic symptoms:  Bleeding tendency negative,       Health Status   Allergies:    Allergic Reactions (Selected)  Mild  Lipitor- No reactions were documented..   Medications:  (Selected)   Prescriptions  Prescribed  Bentyl 20 mg oral tablet: 20 mg, 1 tabs, Oral, QID, for 5 days, PRN: mild pain (1-3), 20 tabs, 0 Refill(s)  Lidoderm 5% topical film: 1 patches, Topical, Daily, for 10 days, remove patches after 12 hours, 10 patches, 0 Refill(s)  Lidoderm 5% topical film: 1 patches, Topical, Daily, for 10 days, remove patches after 12 hours, 10 patches, 0 Refill(s)  PriLOSEC 20 mg oral delayed release capsule: 20 mg, 1 caps, Oral, Daily, 30 caps, 0 Refill(s)  meloxicam 7.5 mg oral tablet: 7.5 mg, 1 tabs, Oral, Daily, pain, PRN: other (see comment), 30 tabs, 0 Refill(s)  ondansetron 4 mg oral tablet: 4 mg, 1 tabs, Oral, q6hr, for 3 days, PRN: as needed for nausea/vomiting, 20 tabs, 0 Refill(s)  predniSONE 20 mg oral tablet: 40 mg, 2 tabs, Oral,  Daily, for 5 days, 10 tabs, 0 Refill(s)  Documented Medications  Documented  Tylenol Extra Strength 500 mg oral tablet: mg, tabs, Oral, q6hr, 0 Refill(s)  atenolol 100 mg oral tablet: 100 mg, 1 tabs, Oral, Daily, 30 tabs, 0 Refill(s)  cyclobenzaprine 10 mg oral tablet: 10 mg, 1 tabs, Oral, TID, PRN, 30 tabs, 0 Refill(s)  fluticasone 50 mcg/inh nasal spray: 1 sprays, Nasal, BID, 16 g, 0 Refill(s)  levothyroxine 75 mcg (0.075 mg) oral tablet: 75 mcg, 1 tabs, Oral, Daily, 30 tabs, 0 Refill(s)  lisinopril 40 mg oral tablet: 40 mg, 1 tabs, Oral, Daily, 30 tabs, 0 Refill(s)  oxybutynin 5 mg/24 hours oral tablet, extended release: 5 mg, 1 tabs, Oral, Daily, 30 tabs, 0 Refill(s)  simvastatin 20 mg oral tablet: 20 mg, 1 tabs, Oral, Once a Day (at bedtime), 0 Refill(s).      Past Medical/ Family/ Social History   Medical history:    No active or resolved past medical history items have been selected or recorded., Reviewed as documented in chart.   Surgical  history:    VP shunt ([id number]).  Cholecystectomy ([id number])., Reviewed as documented in chart.   Family history:    No family history items have been selected or recorded., Reviewed as documented in chart.   Social history:    Social & Psychosocial Habits    Tobacco  09/29/2016  Use: Former smoker    Type: Cigarettes    Comment: "teenage years." - 09/29/2016 02:36 - ALBENESIUS, RN, KRISTIN  , Reviewed as documented in chart.   Problem list:    Active Problems (3)  HTN   Hypercholesteremia   Hypothyroid   , per nurse's notes.   Reviewed and agree with nursing notes regarding past medical/surgical/social history      Physical Examination               Vital Signs   Vital Signs   1/44/3154 0:08 EDT Systolic Blood Pressure 676 mmHg  HI    Diastolic Blood Pressure 85 mmHg    Temperature Oral 36.7 degC    Heart Rate Monitored 85 bpm    Respiratory Rate 16 br/min    SpO2 100 %    Measurements   02/14/2019 3:12 EDT Body Mass Index est meas 25.95 kg/m2    Body Mass Index Measured 25.95 kg/m2   02/14/2019 3:11 EDT Height/Length Measured 170 cm    Weight Dosing 75 kg    Basic Oxygen Information   02/14/2019 3:13 EDT Oxygen Therapy Room air   02/14/2019 3:11 EDT SpO2 100 %    Oxygen Therapy Room air    General:  Alert, no acute distress.    Skin:  Warm, dry, intact.    Head:  Normocephalic, atraumatic.    Neck:  Trachea midline.   Eye:  Normal conjunctiva.   Ears, nose, mouth and throat:  Oral mucosa moist.   Cardiovascular:  Regular rate and rhythm, No murmur.    Respiratory:  Lungs are clear to auscultation, respirations are non-labored.    Gastrointestinal:  Soft, Nontender, Non distended.    Musculoskeletal:  No deformity.   Neurological:  No focal neurological deficit observed.   Psychiatric:  Cooperative, appropriate mood & affect.       Medical Decision Making   Rationale:  Patient is afebrile, nontoxic appearing and in no acute distress. She is not hypoxic on room air. Her abdomen is soft and nontender. Labs  unremarkable. CTAP showed shunt with fluid collection in the  right subcutaneous tissue questioning malfunction shunt. However on review of prior CT in April this was seen and neurosurgery was consulted and felt fluid collection represented seroma rather than infection. On discussing this with radiologist he then amended his read that this has had minimal change from prior study. Also this does not correlate with location of pain. Because of his initial read I had ordered a shunt series. After finding that this was seen on prior CT I canceled shunt series, and told this to x-ray technician Wallace. At the time of the end of my shift patient is still pending urine test. Suspect constipation as etiology of her pain. Care was signed out to oncoming provider Dr. Edison Pace to follow up urine for likely discharge.   Results review:  Lab results : Lab View   02/14/2019 4:07 EDT Estimated Creatinine Clearance 99.23 mL/min   02/14/2019 3:29 EDT WBC 7.6 x10e3/mcL    RBC 3.59 x10e6/mcL  LOW    Hgb 10.8 g/dL  LOW    HCT 34.4 %    MCV 95.8 fL    MCH 30.1 pg    MCHC 31.4 g/dL  LOW    RDW 15.0 %    Platelet 249 x10e3/mcL    MPV 11.5 fL    Neutro Auto 49.5 %    Neutro Absolute 3.8 x10e3/mcL    Immature Grans Percent 0.3 %  NA    Immature Grans Absolute 0.0 x10e3/mcL  NA    Lymph Auto 37.1 %    Lymph Absolute 2.8 x10e3/mcL    Mono Auto 11.2 %    Mono Absolute 0.9 x10e3/mcL    Eosinophil Percent 1.5 %    Eos Absolute 0.1 x10e3/mcL    Basophil Auto 0.4 %    Baso Absolute 0.0 x10e3/mcL    NRBC Absolute Auto 0.00 x10e3/mcL  NA    NRBC Percent Auto 0.0 %  NA    Sodium Lvl 140 mmol/L    Potassium Lvl 4.3 mmol/L    Chloride 102 mmol/L    CO2 28 mmol/L    Glucose Random 103 mg/dL  HI    BUN 20 mg/dL    Creatinine Lvl 0.7 mg/dL    AGAP 10 mmol/L    Osmolality Calc 282 mOsm/kg    Calcium Lvl 9.6 mg/dL    Protein Total 6.9 g/dL    Albumin Lvl 4.6 g/dL    Alk Phos 84 unit/L    AST 16 unit/L    ALT 11 unit/L    eGFR AA 115 mL/min/1.61m???    eGFR Non-AA  100 mL/min/1.750m??    Bili Total 0.30 mg/dL    Bili Direct <0.20 mg/dL    Lipase Lvl 15 unit/L   02/14/2019 3:12 EDT Estimated Creatinine Clearance 86.83 mL/min       Reexamination/ Reevaluation   Vital signs   Basic Oxygen Information   02/14/2019 3:13 EDT Oxygen Therapy Room air   02/14/2019 3:11 EDT SpO2 100 %    Oxygen Therapy Room air         Impression and Plan   Diagnosis   Constipated (ICD10-CM K59.00, Discharge, Medical)   Plan   Condition: Stable.    Disposition: Patient care transitioned to: Time: 02/14/2019 06:38:00, KING-MD,  MELANIE ELAINE.    Patient was given the following educational materials: Constipation, Adult.    Follow up with: Follow up with primary care provider Within 1 week Return to ED if symptoms worsen.    [employer] operator  on 02/14/2019 06:38 AM EDT   ________________________________________________   Royston Sinner E               Modified by: Royston Sinner E on 02/14/2019 06:38 AM EDT

## 2019-02-14 NOTE — ED Provider Notes (Signed)
Constipation *ED        Patient:   Kaitlin Porter, Kaitlin Porter             MRN: [id number]            FIN: [id number]               Age:   53 years     Sex:  Female     DOB:  [birth year]   Associated Diagnoses:   Constipated   Author:   Fernand Parkins      Basic Information   Time seen: Provider Seen (ST)   ED Provider/Time:    MILLER-DO,  MEGAN E / 02/14/2019 03:03  .   Additional information: Chief Complaint from Nursing Triage Note   Chief Complaint  Chief Complaint: pt c/o lower abdominal cramping x2-3 hrs (02/14/19 03:11:00).      History of Present Illness   Assumed care of patient from Dr. Sabra Heck, see her note for HPI and evaluation.        Health Status   Allergies:    Allergic Reactions (All)  Mild  Lipitor- No reactions were documented.  Canceled/Inactive Reactions (All)  No Known Allergies.   Medications:  (Selected)   Prescriptions  Prescribed  Bentyl 20 mg oral tablet: 20 mg, 1 tabs, Oral, QID, for 5 days, PRN: mild pain (1-3), 20 tabs, 0 Refill(s)  Lidoderm 5% topical film: 1 patches, Topical, Daily, for 10 days, remove patches after 12 hours, 10 patches, 0 Refill(s)  Lidoderm 5% topical film: 1 patches, Topical, Daily, for 10 days, remove patches after 12 hours, 10 patches, 0 Refill(s)  PriLOSEC 20 mg oral delayed release capsule: 20 mg, 1 caps, Oral, Daily, 30 caps, 0 Refill(s)  meloxicam 7.5 mg oral tablet: 7.5 mg, 1 tabs, Oral, Daily, pain, PRN: other (see comment), 30 tabs, 0 Refill(s)  ondansetron 4 mg oral tablet: 4 mg, 1 tabs, Oral, q6hr, for 3 days, PRN: as needed for nausea/vomiting, 20 tabs, 0 Refill(s)  predniSONE 20 mg oral tablet: 40 mg, 2 tabs, Oral, Daily, for 5 days, 10 tabs, 0 Refill(s)  Documented Medications  Documented  Tylenol Extra Strength 500 mg oral tablet: mg, tabs, Oral, q6hr, 0 Refill(s)  atenolol 100 mg oral tablet: 100 mg, 1 tabs, Oral, Daily, 30 tabs, 0 Refill(s)  cyclobenzaprine 10 mg oral tablet: 10 mg, 1 tabs, Oral, TID, PRN, 30 tabs, 0 Refill(s)  fluticasone 50 mcg/inh nasal  spray: 1 sprays, Nasal, BID, 16 g, 0 Refill(s)  levothyroxine 75 mcg (0.075 mg) oral tablet: 75 mcg, 1 tabs, Oral, Daily, 30 tabs, 0 Refill(s)  lisinopril 40 mg oral tablet: 40 mg, 1 tabs, Oral, Daily, 30 tabs, 0 Refill(s)  oxybutynin 5 mg/24 hours oral tablet, extended release: 5 mg, 1 tabs, Oral, Daily, 30 tabs, 0 Refill(s)  simvastatin 20 mg oral tablet: 20 mg, 1 tabs, Oral, Once a Day (at bedtime), 0 Refill(s).      Past Medical/ Family/ Social History   Problem list:    Active Problems (3)  HTN   Hypercholesteremia   Hypothyroid   .      Physical Examination               Vital Signs   Vital Signs   5/78/4696 2:95 EDT Systolic Blood Pressure 284 mmHg  HI    Diastolic Blood Pressure 132 mmHg  >HHI    Heart Rate Monitored 82 bpm    Respiratory Rate 16 br/min    SpO2 100 %  9/37/1696 7:89 EDT Systolic Blood Pressure 381 mmHg  HI    Diastolic Blood Pressure 85 mmHg    Temperature Oral 36.7 degC    Heart Rate Monitored 85 bpm    Respiratory Rate 16 br/min    SpO2 100 %    Measurements   02/14/2019 3:12 EDT Body Mass Index est meas 25.95 kg/m2    Body Mass Index Measured 25.95 kg/m2   02/14/2019 3:11 EDT Height/Length Measured 170 cm    Weight Dosing 75 kg       Medical Decision Making   Results review:  Lab results : Lab View   02/14/2019 6:29 EDT Appear U POC CLEAR    Color U POC YELLOW    Bili U POC Negative    Blood U POC Negative    Glucose U POC NEGATIVE mg/dL    Ketones U POC Negative mg/dL    Leuk Est U POC Negative    Nitrite U POC Negative    pH U POC 6.0    Protein U POC Negative    Spec Grav U POC 1.020    Urobilin U POC 0.2 EU/dL   02/14/2019 4:07 EDT Estimated Creatinine Clearance 99.23 mL/min   02/14/2019 3:29 EDT WBC 7.6 x10e3/mcL    RBC 3.59 x10e6/mcL  LOW    Hgb 10.8 g/dL  LOW    HCT 34.4 %    MCV 95.8 fL    MCH 30.1 pg    MCHC 31.4 g/dL  LOW    RDW 15.0 %    Platelet 249 x10e3/mcL    MPV 11.5 fL    Neutro Auto 49.5 %    Neutro Absolute 3.8 x10e3/mcL    Immature Grans Percent 0.3 %  NA    Immature Grans  Absolute 0.0 x10e3/mcL  NA    Lymph Auto 37.1 %    Lymph Absolute 2.8 x10e3/mcL    Mono Auto 11.2 %    Mono Absolute 0.9 x10e3/mcL    Eosinophil Percent 1.5 %    Eos Absolute 0.1 x10e3/mcL    Basophil Auto 0.4 %    Baso Absolute 0.0 x10e3/mcL    NRBC Absolute Auto 0.00 x10e3/mcL  NA    NRBC Percent Auto 0.0 %  NA    Sodium Lvl 140 mmol/L    Potassium Lvl 4.3 mmol/L    Chloride 102 mmol/L    CO2 28 mmol/L    Glucose Random 103 mg/dL  HI    BUN 20 mg/dL    Creatinine Lvl 0.7 mg/dL    AGAP 10 mmol/L    Osmolality Calc 282 mOsm/kg    Calcium Lvl 9.6 mg/dL    Protein Total 6.9 g/dL    Albumin Lvl 4.6 g/dL    Alk Phos 84 unit/L    AST 16 unit/L    ALT 11 unit/L    eGFR AA 115 mL/min/1.70m???    eGFR Non-AA 100 mL/min/1.750m??    Bili Total 0.30 mg/dL    Bili Direct <0.20 mg/dL    Lipase Lvl 15 unit/L   02/14/2019 3:12 EDT Estimated Creatinine Clearance 86.83 mL/min    Radiology results:  Rad Results (ST)   No qualifying data available..      Reexamination/ Reevaluation   Pt walking around room states she feels a little better.  Discussed her labs and that her shunt looks unchanged from prior.  Will give rx for constipation as this is her second visit for the same in a month with full workups and  CTs.      Impression and Plan   Diagnosis   Constipated (ICD10-CM K59.00, Discharge, Medical)   Plan   Condition: Improved.    Disposition: Discharged: to home.    Prescriptions: Launch prescriptions   Pharmacy:  [facility] 50 mg oral capsule (Prescribe): 50 mg, 1 caps, Oral, BID, PRN: for constipation, 60 caps, 0 Refill(s).    Patient was given the following educational materials: Constipation, Adult, Constipation, Adult, Easy-to-Read.    Follow up with: Follow up with primary care provider Within 1 week Return to ED if symptoms worsen; Follow up with primary care provider Within 1 week.    Counseled: Patient.    Signature Line     Electronically Signed on 02/28/2019 03:06 PM EDT   ________________________________________________    Fernand Parkins

## 2019-02-14 NOTE — ED Notes (Signed)
ED Triage Note       ED Triage Adult Entered On:  02/14/2019 3:12 EDT    Performed On:  02/14/2019 3:11 EDT by Theresa DutyNettles, RN, [place]               Triage   Chief Complaint :   pt c/o lower abdominal cramping x2-3 hrs   Numeric Rating Pain Scale :   2   [place] Mode of Arrival :   Ambulance   Infectious Disease Documentation :   Document assessment   Temperature Oral :   36.7 degC(Converted to: 98.1 degF)    Heart Rate Monitored :   85 bpm   Respiratory Rate :   16 br/min   Systolic Blood Pressure :   153 mmHg (HI)    Diastolic Blood Pressure :   85 mmHg   SpO2 :   100 %   Oxygen Therapy :   Room air   Patient presentation :   None of the above   Chief Complaint or Presentation suggest infection :   No   Weight Dosing :   75 kg(Converted to: 165 lb 6 oz)    Height :   170 cm(Converted to: 5 ft 7 in)    Body Mass Index Dosing :   26 kg/m2   Nettles, RN, [place] - 02/14/2019 3:11 EDT   DCP GENERIC CODE   Tracking Acuity :   3   Tracking Group :   ED [employer]   CornishNettles, RN, [place] - 02/14/2019 3:11 EDT   ED General Section :   Document assessment   Pregnancy Status :   Patient denies   Approximate Last Menstrual Period :   menopause   ED Allergies Section :   Document assessment   ED Reason for Visit Section :   Document assessment   ED Quick Assessment :   Patient appears awake, alert, oriented to baseline. Skin warm and dry. Moves all extremities. Respiration even and unlabored. Appears in no apparent distress.   Nettles, RN, [place] - 02/14/2019 3:11 EDT   ID Risk Screen Symptoms   Recent Travel History :   No recent travel   Close Contact with COVID-19  ID :   No   Have you been tested for COVID-19 ID :   No   TB Symptom Screen :   No symptoms   C. diff Symptom/History ID :   Neither of the above   Nettles, RN, [place] - 02/14/2019 3:11 EDT   Allergies   (As Of: 02/14/2019 03:12:44 EDT)   Allergies (Active)   Lipitor  Estimated Onset Date:   Unspecified ; Created ByMarrion Coy:   ALICEA, RN, LARA J; Reaction  Status:   Active ; Category:   Drug ; Substance:   Lipitor ; Type:   Allergy ; Severity:   Mild ; Updated By:   Marrion CoyALICEA, RN, Kathe BectonLARA J; Reviewed Date:   02/11/2019 3:04 EDT        Psycho-Social   Last 3 mo, thoughts killing self/others :   Patient denies   Nettles, RN, [place] - 02/14/2019 3:11 EDT   ED Reason for Visit   (As Of: 02/14/2019 03:12:44 EDT)   Problems(Active)    HTN (SNOMED CT  :[id number] )  Name of Problem:   HTN ; Recorder:   CABE, RN, ADAM Z; Confirmation:   Confirmed ; Classification:   Medical ; Code:   AZEGxwEmLzUcjRiawKgAAg ; Contributor System:   DietitianowerChart ; Last Updated:  09/18/2016 13:23 EST ; Life Cycle Date:   09/18/2016 ; Life Cycle Status:   Active ; Vocabulary:   SNOMED CT        Hypercholesteremia (IMO  :[id number] )  Name of Problem:   Hypercholesteremia ; Recorder:   CABE, RN, ADAM Z; Confirmation:   Confirmed ; Classification:   Medical ; Code:   90240 ; Contributor System:   PowerChart ; Last Updated:   09/18/2016 13:23 EST ; Life Cycle Date:   09/18/2016 ; Life Cycle Status:   Active ; Vocabulary:   IMO        Hypothyroid (IMO  :[id number] )  Name of Problem:   Hypothyroid ; Recorder:   MALONE, RN, JENNIFER L; Confirmation:   Confirmed ; Classification:   Medical ; Code:   [id number] ; Contributor System:   Dietitian ; Last Updated:   09/19/2016 14:07 EST ; Life Cycle Date:   09/19/2016 ; Life Cycle Status:   Active ; Vocabulary:   IMO          Diagnoses(Active)    Abdominal pain  Date:   02/14/2019 ; Diagnosis Type:   Reason For Visit ; Confirmation:   Complaint of ; Clinical Dx:   Abdominal pain ; Classification:   Medical ; Clinical Service:   Emergency medicine ; Code:   PNED ; Probability:   0 ; Diagnosis Code:   [id number]

## 2019-02-14 NOTE — ED Notes (Signed)
ED Pre-Arrival Note        Pre-Arrival Summary    Name:  89,    Current Date:  02/14/2019 03:03:16 EDT  Gender:  Female  Date of Birth:    Age:  53  Pre-Arrival Type:  EMS  ETA:  02/14/2019 03:25:00 EDT  Primary Care Physician:    Presenting Problem:  abd pain  Pre-Arrival User:  Milagros Evener, RN, BLAIR A  Referring Source:    Location:  [facility]            PreArrival Communication Form  Emergency Department        Additional Patient Information:        Orders:  [    ] CBC                                            [     ] CT Head no contrast  [    ] BMP                                           [     ] CT Abdomen/Pelvis no contrast  [    ] PT/INR                                       [     ] CT Abdomen/Pelvis IV contrast, w/ oral contrast  [    ] Troponin                                   [     ] CT Abdomen/Pelvis IV contrast, no oral contrast  [    ] BNP                                            [     ] See ordersheet  [    ] CXR                                             [     ] Other:__________________________  [    ] EKG

## 2019-02-14 NOTE — ED Notes (Signed)
ED Patient Education Note     Patient Education Materials Follows:  Home Health Care     Constipation, Adult    Constipation is when a person:     Poops (has a bowel movement) less than 3 times a week.     Has a hard time pooping.     Has poop that is dry, hard, or bigger than normal.    HOME CARE     Eat foods with a lot of fiber in them. This includes fruits, vegetables, beans, and whole grains such as brown rice.        Avoid fatty foods and foods with a lot of sugar. This includes french fries, hamburgers, cookies, candy, and soda.      If you are not getting enough fiber from food, take products with added fiber in them (supplements).      Drink enough fluid to keep your pee (urine) clear or pale yellow.     Exercise on a regular basis, or as told by your doctor.      Go to the restroom when you feel like you need to poop. Do not hold it.      Only take medicine as told by your doctor. Do not take medicines that help you poop (laxatives) without talking to your doctor first.     GET HELP RIGHT AWAY IF:     You have bright red blood in your poop (stool).     Your constipation lasts more than 4 days or gets worse.     You have belly (abdominal) or butt (rectal) pain.     You have thin poop (as thin as a pencil).     You lose weight, and it cannot be explained.    MAKE SURE YOU:     Understand these instructions.      Will watch your condition.     Will get help right away if you are not doing well or get worse.    This information is not intended to replace advice given to you by your health care provider. Make sure you discuss any questions you have with your health care provider.    Document Released: 03/06/2008 Document Revised: 10/09/2014 Document Reviewed: 06/30/2013  Elsevier Interactive Patient Education ?2016 [employer]         Constipation, Adult    Constipation is when a person has fewer than three bowel movements a week, has difficulty having a bowel movement, or has stools that are dry, hard, or  larger than normal. As people grow older, constipation is more common.  A low-fiber diet, not taking in enough fluids, and taking certain medicines may make constipation worse.     CAUSES     Certain medicines, such as antidepressants, pain medicine, iron supplements, antacids, and water pills. ?     Certain diseases, such as diabetes, irritable bowel syndrome (IBS), thyroid disease, or depression. ?     Not drinking enough water. ?     Not eating enough fiber-rich foods. ?     Stress or travel. ?     Lack of physical activity or exercise. ?     Ignoring the urge to have a bowel movement. ?     Using laxatives too much. ?    SIGNS AND SYMPTOMS     Having fewer than three bowel movements a week. ?     Straining to have a bowel movement. ?     Having stools that are  hard, dry, or larger than normal. ?     Feeling full or bloated. ?     Pain in the lower abdomen. ?     Not feeling relief after having a bowel movement. ?    DIAGNOSIS    Your health care provider will take a medical history and perform a physical exam. Further testing may be done for severe constipation. Some tests may include:     A barium enema X-ray to examine your rectum, colon, and, sometimes, your small intestine. ?     A sigmoidoscopy to examine your lower colon. ?     A colonoscopy to examine your entire colon.     TREATMENT    Treatment will depend on the severity of your constipation and what is causing it. Some dietary treatments include drinking more fluids and eating more fiber-rich foods. Lifestyle treatments may include regular exercise. If these diet and lifestyle recommendations do not help, your health care provider may recommend taking over-the-counter laxative medicines to help you have bowel movements. Prescription medicines may be prescribed if over-the-counter medicines do not work.     HOME CARE INSTRUCTIONS     Eat foods that have a lot of fiber, such as fruits, vegetables, whole grains, and beans.     Limit foods high in fat and  processed sugars, such as french fries, hamburgers, cookies, candies, and soda. ?     A fiber supplement may be added to your diet if you cannot get enough fiber from foods. ?     Drink enough fluids to keep your urine clear or pale yellow. ?     Exercise regularly or as directed by your health care provider. ?     Go to the restroom when you have the urge to go. Do not hold it. ?     Only take over-the-counter or prescription medicines as directed by your health care provider. Do not take other medicines for constipation without talking to your health care provider first. ?     SEEK IMMEDIATE MEDICAL CARE IF:     You have bright red blood in your stool. ?     Your constipation lasts for more than 4 days or gets worse. ?     You have abdominal or rectal pain. ?     You have thin, pencil-like stools. ?     You have unexplained weight loss.     MAKE SURE YOU:     Understand these instructions.     Will watch your condition.     Will get help right away if you are not doing well or get worse.    This information is not intended to replace advice given to you by your health care provider. Make sure you discuss any questions you have with your health care provider.    Document Released: 06/16/2004 Document Revised: 10/09/2014 Document Reviewed: 06/30/2013  Elsevier Interactive Patient Education ?2016 [employer]

## 2019-02-20 NOTE — ED Provider Notes (Signed)
Abdominal Pain *ED        Patient:   Kaitlin Porter, Kaitlin Porter             MRN: [id number]            FIN: [phone]               Age:   53 years     Sex:  Female     DOB:  1966-04-20   Associated Diagnoses:   Constipation   Author:   Lucia Estelle E      Basic Information   Time seen: Provider Seen (ST)   ED Provider/Time:    Shanetra Blumenstock-DO,  Rubbie Goostree E / 02/20/2019 02:15  .   History source: Patient.   Arrival mode: Ambulance.   History limitation: None.   Additional information: Chief Complaint from Nursing Triage Note   Chief Complaint  Chief Complaint: Pt c/o right hip pain, back pain and rib pain for "awhile". Pt states her abdomen feels like she's on her cycle. pt seen here recently for bilateral knee pain. ambulatory to stretcher. NAD noted. (02/20/19 02:07:00).      History of Present Illness   The patient presents with abdominal pain.  The onset was 2  weeks ago.  The course/duration of symptoms is fluctuating in intensity.  The character of symptoms is crampy and pressure.  The degree at onset was moderate.  The Location of pain at onset was diffuse and abdominal.  The degree at present is moderate.  The Location of pain at present is diffuse and abdominal.  Radiating pain: none. The exacerbating factor is none.  The relieving factor is none.  Therapy today: none.  Risk factors consist of hypertension.  Associated symptoms: none.  Additional history: Patient denies any fevers, nausea, vomiting, diarrhea.        Review of Systems   Constitutional symptoms:  No fever, no chills.    Skin symptoms:  No jaundice, no rash.    Eye symptoms:  Vision unchanged.   ENMT symptoms:  No sore throat,    Respiratory symptoms:  No shortness of breath,    Cardiovascular symptoms:  No chest pain,    Gastrointestinal symptoms:  Abdominal pain, constipation, no nausea, no vomiting, no diarrhea.    Genitourinary symptoms:  No dysuria, no hematuria.    Musculoskeletal symptoms:  No back pain,    Neurologic symptoms:  No headache, no dizziness,  no numbness, no tingling, no focal weakness.    Psychiatric symptoms:  No anxiety, no depression.    Hematologic/Lymphatic symptoms:  Bleeding tendency negative,       Health Status   Allergies:    Allergic Reactions (Selected)  Mild  Lipitor- No reactions were documented.  OxyCODONE- Hives.  TraMADol- Hives..   Medications:  (Selected)   Inpatient Medications  Ordered  Toradol: 30 mg, 1 mL, IM, Once  Prescriptions  Prescribed  Bentyl 20 mg oral tablet: 20 mg, 1 tabs, Oral, QID, for 5 days, PRN: mild pain (1-3), 20 tabs, 0 Refill(s)  Colace 50 mg oral capsule: 50 mg, 1 caps, Oral, BID, PRN: for constipation, 60 caps, 0 Refill(s)  Lidoderm 5% topical film: 1 patches, Topical, Daily, for 10 days, remove patches after 12 hours, 10 patches, 0 Refill(s)  Lidoderm 5% topical film: 1 patches, Topical, Daily, for 10 days, remove patches after 12 hours, 10 patches, 0 Refill(s)  PriLOSEC 20 mg oral delayed release capsule: 20 mg, 1 caps, Oral, Daily, 30 caps, 0 Refill(s)  ondansetron 4 mg oral tablet: 4 mg, 1 tabs, Oral, q6hr, for 3 days, PRN: as needed for nausea/vomiting, 20 tabs, 0 Refill(s)  Documented Medications  Documented  Tylenol Extra Strength 500 mg oral tablet: mg, tabs, Oral, q6hr, 0 Refill(s)  atenolol 100 mg oral tablet: 100 mg, 1 tabs, Oral, Daily, 30 tabs, 0 Refill(s)  cyclobenzaprine 10 mg oral tablet: 10 mg, 1 tabs, Oral, TID, PRN, 30 tabs, 0 Refill(s)  fluticasone 50 mcg/inh nasal spray: 1 sprays, Nasal, BID, 16 g, 0 Refill(s)  levothyroxine 75 mcg (0.075 mg) oral tablet: 75 mcg, 1 tabs, Oral, Daily, 30 tabs, 0 Refill(s)  lisinopril 40 mg oral tablet: 40 mg, 1 tabs, Oral, Daily, 30 tabs, 0 Refill(s)  oxybutynin 5 mg/24 hours oral tablet, extended release: 5 mg, 1 tabs, Oral, Daily, 30 tabs, 0 Refill(s)  simvastatin 20 mg oral tablet: 20 mg, 1 tabs, Oral, Once a Day (at bedtime), 0 Refill(s).      Past Medical/ Family/ Social History   Medical history:    No active or resolved past medical history items  have been selected or recorded., Reviewed as documented in chart.   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  Cholecystectomy ([id number])., Reviewed as documented in chart.   Family history:    No family history items have been selected or recorded., Reviewed as documented in chart.   Social history:    Social & Psychosocial Habits    Tobacco  09/29/2016  Use: Former smoker    Type: Cigarettes    Comment: "teenage years." - 09/29/2016 02:36 - ALBENESIUS, RN, KRISTIN  , Reviewed as documented in chart.   Problem list:    Active Problems (3)  HTN   Hypercholesteremia   Hypothyroid   Schizophrenia, per nurse's notes.   Reviewed and agree with nursing notes regarding past medical/surgical/social history      Physical Examination               Vital Signs   Vital Signs   02/20/2019 2:07 EDT Systolic Blood Pressure 138 mmHg    Diastolic Blood Pressure 60 mmHg    Temperature Oral 36.7 degC    Heart Rate Monitored 81 bpm    Respiratory Rate 17 br/min    SpO2 100 %    Measurements   02/20/2019 2:11 EDT Body Mass Index est meas 33.14 kg/m2    Body Mass Index Measured 33.14 kg/m2   02/20/2019 2:07 EDT Height/Length Measured 167.6 cm    Weight Dosing 93.1 kg    Basic Oxygen Information   02/20/2019 2:07 EDT SpO2 100 %    Oxygen Therapy Room air    General:  Alert, no acute distress.    Skin:  Warm, dry, intact.    Head:  Normocephalic, atraumatic.    Neck:  Trachea midline.   Eye:  Normal conjunctiva.   Ears, nose, mouth and throat:  Oral mucosa moist.   Cardiovascular:  Regular rate and rhythm, No murmur.    Respiratory:  Lungs are clear to auscultation, respirations are non-labored.    Gastrointestinal:  Soft, Nontender, Non distended.    Musculoskeletal:  No deformity.   Neurological:  No focal neurological deficit observed.   Psychiatric:  Cooperative, appropriate mood & affect.       Medical Decision Making   Rationale:  Patient is afebrile, nontoxic appearing and in no acute distress. She is not hypoxic on room air. I saw  and evaluated this patient 5 days ago, she had a  full work up with labs and CT imaging. She has no new symptoms tonight. As she is not febrile, tolerating po intake and without vomiting/diarrhea I do not see indication for repeat testing. She was treated with Toradol, Bentyl and Colace. At this time she is appropriate for discharge.   Abdominal/KUB X-ray  Interpretation by Emergency Physician, constipation.       Reexamination/ Reevaluation   Vital signs   Basic Oxygen Information   02/20/2019 2:07 EDT SpO2 100 %    Oxygen Therapy Room air         Impression and Plan   Diagnosis   Constipation (ICD10-CM K59.00, Discharge, Medical)   Plan   Condition: Stable.    Disposition: Discharged: to home.    Prescriptions: Launch prescriptions   Pharmacy:  senna 8.6 mg oral tablet (Prescribe): 17.2 mg, 2 tabs, Oral, Once a Day (at bedtime), PRN: for constipation, 20 tabs, 0 Refill(s).    Patient was given the following educational materials: Constipation, Adult, High-Fiber Diet.    Follow up with: Follow up with primary care provider Within 1 week Return to ED if symptoms worsen.    Counseled: Patient, Regarding diagnosis, Regarding diagnostic results, Regarding treatment plan, Patient indicated understanding of instructions.    Notes: I have spoken with the patient and I have explained the patient's condition, diagnoses and treatment plan based on the information available to me at this time. I have answered the patient's questions and addressed any concerns. The patient verbalizes a good understanding of the patient's diagnosis, condition and treatment plan as can be expected at this point. The vital signs have been stable. The patient's condition is stable and appropriate for discharge from the emergency department. The patient will pursue further outpatient evaluation with the primary care physician or other designated or consulting physician as outlined in the discharge instructions. The patient is agreeable to this plan  of care and follow-up instructions have been explained in detail. The patient has received these instructions in written format and have expressed an understanding of the discharge instructions. The patient is aware that any significant change in condition or worsening of symptoms should prompt an immediate return to this or the closest emergency department or a call to 911.   [employer] Line     Electronically Signed on 02/20/2019 03:57 AM EDT   ________________________________________________   Lucia Estelle E               Modified by: Lucia Estelle E on 02/20/2019 03:57 AM EDT

## 2019-02-20 NOTE — Discharge Summary (Signed)
 ED Clinical Summary                     [facility]  [address]  [place], [place] [address]  [phone]          PERSON INFORMATION  Name: Kaitlin Porter, Kaitlin Porter Age:  53 Years DOB: Jun 18, 1966   Sex: Female Language: English PCP: MELISSA SAVANT   Marital Status: Single Phone: [phone] Med Service: MED-Medicine   MRN: [id number] Acct# [id number] Arrival: 02/20/2019 02:06:00   Visit Reason: Hip pain-swelling; LEG PAIN Acuity: 3 LOS: 000 02:11   Address:    [address] [place] [facility] [place] [address]   Diagnosis:    Constipation  Medications:          New Medications  Printed Prescriptions  senna (senna 8.6 mg oral tablet) 2 Tabs Oral (given by mouth) Once a Day (at bedtime) as needed for constipation. Refills: 0.  Last Dose:____________________  Medications that have not changed  Other Medications  acetaminophen (Tylenol Extra Strength 500 mg oral tablet) Oral (given by mouth) every 6 hours.  Last Dose:____________________  atenolol (atenolol 100 mg oral tablet) 1 Tabs Oral (given by mouth) every day.  Last Dose:____________________  cyclobenzaprine (cyclobenzaprine 10 mg oral tablet) 1 Tabs Oral (given by mouth) 3 times a day as needed.,  THIS MEDICATION IS ASSOCIATED  WITH  AN INCREASED RISK OF FALLS.  Last Dose:____________________  dicyclomine (Bentyl 20 mg oral tablet) 1 Tabs Oral (given by mouth) 4 times a day as needed mild pain (1-3) for 5 Days. Refills: 0., for abdominal pain  Last Dose:____________________  docusate (Colace 50 mg oral capsule) 1 Capsules Oral (given by mouth) 2 times a day as needed for constipation. Refills: 0.  Last Dose:____________________  fluticasone nasal (fluticasone 50 mcg/inh nasal spray) 1 Sprays Nasal (into the nose) 2 times a day.  Last Dose:____________________  levothyroxine (levothyroxine 75 mcg (0.075 mg) oral tablet) 1 Tabs Oral (given by mouth) every day.  Last Dose:____________________  lidocaine topical (Lidoderm 5% topical film) 1  Patches Topical (on the skin) every day for 10 Days. remove patches after 12 hours. Refills: 0.  Last Dose:____________________  lidocaine topical (Lidoderm 5% topical film) 1 Patches Topical (on the skin) every day for 10 Days. remove patches after 12 hours. Refills: 0.  Last Dose:____________________  lisinopril (lisinopril 40 mg oral tablet) 1 Tabs Oral (given by mouth) every day.  Last Dose:____________________  omeprazole (PriLOSEC 20 mg oral delayed release capsule) 1 Capsules Oral (given by mouth) every day. Refills: 0.  Last Dose:____________________  ondansetron (ondansetron 4 mg oral tablet) 1 Tabs Oral (given by mouth) every 6 hours as needed as needed for nausea/vomiting for 3 Days. Refills: 0.  Last Dose:____________________  oxybutynin (oxybutynin 5 mg/24 hours oral tablet, extended release) 5 Milligram Oral (given by mouth) every day.  Last Dose:____________________  simvastatin (simvastatin 20 mg oral tablet) 1 Tabs Oral (given by mouth) Once a Day (at bedtime).  Last Dose:____________________      Medications Administered During Visit:                Medication Dose Route   ketorolac 30 mg IM   dicyclomine 10 mg IM   docusate 100 mg Oral               Allergies      Lipitor      traMADol (Hives)      oxyCODONE (Hives)  Major Tests and Procedures:  The following procedures and tests were performed during your ED visit.  COMMON PROCEDURES%>  COMMON PROCEDURES COMMENTS%>                PROVIDER INFORMATION               Provider Role Assigned Sampson Simmonds, RN, Almarie HERO ED Nurse 02/20/2019 02:13:09    TERRESA BOUCHARD E ED Provider 02/20/2019 02:15:28        Attending Physician:  TERRESA BOUCHARD E      Admit Doc  MILLER-DO,  MEGAN E     Consulting Doc       VITALS INFORMATION  Vital Sign Triage Latest   Temp Oral ORAL_1%> ORAL%>   Temp Temporal TEMPORAL_1%> TEMPORAL%>   Temp Intravascular INTRAVASCULAR_1%> INTRAVASCULAR%>   Temp Axillary AXILLARY_1%> AXILLARY%>   Temp Rectal RECTAL_1%>  RECTAL%>   02 Sat 100 % 100 %   Respiratory Rate RATE_1%> RATE%>   Peripheral Pulse Rate PULSE RATE_1%> PULSE RATE%>   Apical Heart Rate HEART RATE_1%> HEART RATE%>   Blood Pressure BLOOD PRESSURE_1%>/ BLOOD PRESSURE_1%>60 mmHg BLOOD PRESSURE%> / BLOOD PRESSURE%>60 mmHg                 Immunizations      No Immunizations Documented This Visit          DISCHARGE INFORMATION   Discharge Disposition: H Outpt-Sent Home   Discharge Location:  Home   Discharge Date and Time:  02/20/2019 04:17:38   ED Checkout Date and Time:  02/20/2019 04:17:38     DEPART REASON INCOMPLETE INFORMATION               Depart Action Incomplete Reason   Interactive View/I&O Recently assessed               Problems      Active           Hypothyroid          Hypercholesteremia          HTN              Smoking Status      Former smoker         PATIENT EDUCATION INFORMATION  Instructions:     High-Fiber Diet; Constipation, Adult     Follow up:                   With: Address: When:   Follow up with primary care provider  Within 1 week   Comments:   Return to ED if symptoms worsen              ED PROVIDER DOCUMENTATION     Patient:   Kaitlin Porter, Kaitlin Porter             MRN: [id number]            FIN: [phone]               Age:   53 years     Sex:  Female     DOB:  1966/08/30   Associated Diagnoses:   Constipation   Author:   TERRESA BOUCHARD E      Basic Information   Time seen: Provider Seen (ST)   ED Provider/Time:    MILLER-DO,  MEGAN E / 02/20/2019 02:15  .   History source: Patient.   Arrival mode: Ambulance.   History limitation: None.   Additional information: Chief Complaint  from Nursing Triage Note   Chief Complaint  Chief Complaint: Pt c/o right hip pain, back pain and rib pain for awhile. Pt states her abdomen feels like she's on her cycle. pt seen here recently for bilateral knee pain. ambulatory to stretcher. NAD noted. (02/20/19 02:07:00).      History of Present Illness   The patient presents with abdominal pain.  The onset was 2  weeks ago.   The course/duration of symptoms is fluctuating in intensity.  The character of symptoms is crampy and pressure.  The degree at onset was moderate.  The Location of pain at onset was diffuse and abdominal.  The degree at present is moderate.  The Location of pain at present is diffuse and abdominal.  Radiating pain: none. The exacerbating factor is none.  The relieving factor is none.  Therapy today: none.  Risk factors consist of hypertension.  Associated symptoms: none.  Additional history: Patient denies any fevers, nausea, vomiting, diarrhea.        Review of Systems   Constitutional symptoms:  No fever, no chills.    Skin symptoms:  No jaundice, no rash.    Eye symptoms:  Vision unchanged.   ENMT symptoms:  No sore throat,    Respiratory symptoms:  No shortness of breath,    Cardiovascular symptoms:  No chest pain,    Gastrointestinal symptoms:  Abdominal pain, constipation, no nausea, no vomiting, no diarrhea.    Genitourinary symptoms:  No dysuria, no hematuria.    Musculoskeletal symptoms:  No back pain,    Neurologic symptoms:  No headache, no dizziness, no numbness, no tingling, no focal weakness.    Psychiatric symptoms:  No anxiety, no depression.    Hematologic/Lymphatic symptoms:  Bleeding tendency negative,       Health Status   Allergies:    Allergic Reactions (Selected)  Mild  Lipitor- No reactions were documented.  OxyCODONE- Hives.  TraMADol- Hives..   Medications:  (Selected)   Inpatient Medications  Ordered  Toradol: 30 mg, 1 mL, IM, Once  Prescriptions  Prescribed  Bentyl 20 mg oral tablet: 20 mg, 1 tabs, Oral, QID, for 5 days, PRN: mild pain (1-3), 20 tabs, 0 Refill(s)  Colace 50 mg oral capsule: 50 mg, 1 caps, Oral, BID, PRN: for constipation, 60 caps, 0 Refill(s)  Lidoderm 5% topical film: 1 patches, Topical, Daily, for 10 days, remove patches after 12 hours, 10 patches, 0 Refill(s)  Lidoderm 5% topical film: 1 patches, Topical, Daily, for 10 days, remove patches after 12 hours, 10 patches, 0  Refill(s)  PriLOSEC 20 mg oral delayed release capsule: 20 mg, 1 caps, Oral, Daily, 30 caps, 0 Refill(s)  ondansetron 4 mg oral tablet: 4 mg, 1 tabs, Oral, q6hr, for 3 days, PRN: as needed for nausea/vomiting, 20 tabs, 0 Refill(s)  Documented Medications  Documented  Tylenol Extra Strength 500 mg oral tablet: mg, tabs, Oral, q6hr, 0 Refill(s)  atenolol 100 mg oral tablet: 100 mg, 1 tabs, Oral, Daily, 30 tabs, 0 Refill(s)  cyclobenzaprine 10 mg oral tablet: 10 mg, 1 tabs, Oral, TID, PRN, 30 tabs, 0 Refill(s)  fluticasone 50 mcg/inh nasal spray: 1 sprays, Nasal, BID, 16 g, 0 Refill(s)  levothyroxine 75 mcg (0.075 mg) oral tablet: 75 mcg, 1 tabs, Oral, Daily, 30 tabs, 0 Refill(s)  lisinopril 40 mg oral tablet: 40 mg, 1 tabs, Oral, Daily, 30 tabs, 0 Refill(s)  oxybutynin 5 mg/24 hours oral tablet, extended release: 5 mg, 1 tabs, Oral, Daily, 30 tabs, 0  Refill(s)  simvastatin 20 mg oral tablet: 20 mg, 1 tabs, Oral, Once a Day (at bedtime), 0 Refill(s).      Past Medical/ Family/ Social History   Medical history:    No active or resolved past medical history items have been selected or recorded., Reviewed as documented in chart.   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  Cholecystectomy ([id number])., Reviewed as documented in chart.   Family history:    No family history items have been selected or recorded., Reviewed as documented in chart.   Social history:    Social & Psychosocial Habits    Tobacco  09/29/2016  Use: Former smoker    Type: Cigarettes    Comment: teenage years. - 09/29/2016 02:36 - ALBENESIUS, RN, KRISTIN  , Reviewed as documented in chart.   Problem list:    Active Problems (3)  HTN   Hypercholesteremia   Hypothyroid   Schizophrenia, per nurse's notes.   Reviewed and agree with nursing notes regarding past medical/surgical/social history      Physical Examination               Vital Signs   Vital Signs   02/20/2019 2:07 EDT Systolic Blood Pressure 138 mmHg    Diastolic Blood Pressure 60 mmHg     Temperature Oral 36.7 degC    Heart Rate Monitored 81 bpm    Respiratory Rate 17 br/min    SpO2 100 %    Measurements   02/20/2019 2:11 EDT Body Mass Index est meas 33.14 kg/m2    Body Mass Index Measured 33.14 kg/m2   02/20/2019 2:07 EDT Height/Length Measured 167.6 cm    Weight Dosing 93.1 kg    Basic Oxygen Information   02/20/2019 2:07 EDT SpO2 100 %    Oxygen Therapy Room air    General:  Alert, no acute distress.    Skin:  Warm, dry, intact.    Head:  Normocephalic, atraumatic.    Neck:  Trachea midline.   Eye:  Normal conjunctiva.   Ears, nose, mouth and throat:  Oral mucosa moist.   Cardiovascular:  Regular rate and rhythm, No murmur.    Respiratory:  Lungs are clear to auscultation, respirations are non-labored.    Gastrointestinal:  Soft, Nontender, Non distended.    Musculoskeletal:  No deformity.   Neurological:  No focal neurological deficit observed.   Psychiatric:  Cooperative, appropriate mood & affect.       Medical Decision Making   Rationale:  Patient is afebrile, nontoxic appearing and in no acute distress. She is not hypoxic on room air. I saw and evaluated this patient 5 days ago, she had a full work up with labs and CT imaging. She has no new symptoms tonight. As she is not febrile, tolerating po intake and without vomiting/diarrhea I do not see indication for repeat testing. She was treated with Toradol, Bentyl and Colace. At this time she is appropriate for discharge.   Abdominal/KUB X-ray  Interpretation by Emergency Physician, constipation.       Reexamination/ Reevaluation   Vital signs   Basic Oxygen Information   02/20/2019 2:07 EDT SpO2 100 %    Oxygen Therapy Room air         Impression and Plan   Diagnosis   Constipation (ICD10-CM K59.00, Discharge, Medical)   Plan   Condition: Stable.    Disposition: Discharged: to home.    Prescriptions: Launch prescriptions   Pharmacy:  senna 8.6 mg oral tablet (Prescribe):  17.2 mg, 2 tabs, Oral, Once a Day (at bedtime), PRN: for constipation, 20  tabs, 0 Refill(s).    Patient was given the following educational materials: Constipation, Adult, High-Fiber Diet.    Follow up with: Follow up with primary care provider Within 1 week Return to ED if symptoms worsen.    Counseled: Patient, Regarding diagnosis, Regarding diagnostic results, Regarding treatment plan, Patient indicated understanding of instructions.    Notes: I have spoken with the patient and I have explained the patient's condition, diagnoses and treatment plan based on the information available to me at this time. I have answered the patient's questions and addressed any concerns. The patient verbalizes a good understanding of the patient's diagnosis, condition and treatment plan as can be expected at this point. The vital signs have been stable. The patient's condition is stable and appropriate for discharge from the emergency department. The patient will pursue further outpatient evaluation with the primary care physician or other designated or consulting physician as outlined in the discharge instructions. The patient is agreeable to this plan of care and follow-up instructions have been explained in detail. The patient has received these instructions in written format and have expressed an understanding of the discharge instructions. The patient is aware that any significant change in condition or worsening of symptoms should prompt an immediate return to this or the closest emergency department or a call to 911.   SABRA

## 2019-02-20 NOTE — ED Notes (Signed)
 ED Patient Summary       ;       [facility] Emergency Department  [address] [place] [address]  [phone]  Discharge Instructions (Patient)  _______________________________________     Name: Kaitlin Porter, Kaitlin Porter  DOB: May 24, 1966                   MRN: [id number]                   FIN: [id number]  Reason For Visit: Hip pain-swelling; LEG PAIN  Final Diagnosis: Constipation     Visit Date: 02/20/2019 02:06:00  Address: [address] [place] [address]  Phone: [phone]     Primary Care Provider:      Name: MELISSA SAVANT      Phone: [phone]        Emergency Department Providers:         Primary Physician:   MILLER-DO, MEGAN E         [facility] would like to thank you for allowing us  to assist you with your healthcare needs. The following includes patient education materials and information regarding your injury/illness.     Follow-up Instructions: You were treated today on an emergency basis, it may be wise to contact your primary care provider to notify them of your visit today. You may have been referred to your regular doctor or a specialist, please follow up as instructed. If your condition worsens or you can't get in to see the doctor, contact the Emergency Department.              With: Address: When:   Follow up with primary care provider  Within 1 week   Comments:   Return to ED if symptoms worsen              Printed Prescriptions:    Patient Education Materials:  Discharge Orders          Discharge Patient 02/20/19 3:58:00 EDT         Comment:      High-Fiber Diet; Constipation, Adult     High-Fiber Diet    Fiber, also called dietary fiber, is a type of carbohydrate found in fruits, vegetables, whole grains, and beans. A high-fiber diet can have many health benefits. Your health care provider may recommend a high-fiber diet to help:     Prevent constipation. Fiber can make your bowel movements more regular.     Lower your cholesterol.     Relieve  hemorrhoids, uncomplicated diverticulosis, or irritable bowel syndrome.     Prevent overeating as part of a weight-loss plan.     Prevent heart disease, type 2 diabetes, and certain cancers.    WHAT IS MY PLAN?    The recommended daily intake of fiber includes:     38 grams for men under age 31.     30 grams for men over age 25.     25 grams for women under age 46.     21 grams for women over age 38.    You can get the recommended daily intake of dietary fiber by eating a variety of fruits, vegetables, grains, and beans. Your health care provider may also recommend a fiber supplement if it is not possible to get enough fiber through your diet.    WHAT DO I NEED TO KNOW ABOUT A HIGH-FIBER DIET?     Fiber supplements have not been widely studied  for their effectiveness, so it is better to get fiber through food sources.     Always check the fiber content on the?nutrition facts label of any prepackaged food. Look for foods that contain at least 5 grams of fiber per serving.     Ask your dietitian if you have questions about specific foods that are related to your condition, especially if those foods are not listed in the following section.     Increase your daily fiber consumption gradually. Increasing your intake of dietary fiber too quickly may cause bloating, cramping, or gas.     Drink plenty of water. Water helps you to digest fiber.    WHAT FOODS CAN I EAT?    Grains    Whole-grain breads. Multigrain cereal. Oats and oatmeal. Brown rice. Barley. Bulgur wheat. Millet. Bran muffins. Popcorn. Rye wafer crackers.    Vegetables    Sweet potatoes. Spinach. Kale. Artichokes. Cabbage. Broccoli. Green peas. Carrots. Squash.    Fruits    Berries. Pears. Apples. Oranges. Avocados. Prunes and raisins. Dried figs.    Meats and Other Protein Sources    Navy, kidney, pinto, and soy beans. Split peas. Lentils. Nuts and seeds.    Dairy    Fiber-fortified yogurt.    Beverages    Fiber-fortified soy milk. Fiber-fortified orange  juice.    Other    Fiber bars.    The items listed above may not be a complete list of recommended foods or beverages. Contact your dietitian for more options.    WHAT FOODS ARE NOT RECOMMENDED?    Grains    White bread. Pasta made with refined flour. White rice.    Vegetables    Fried potatoes. Canned vegetables. Well-cooked vegetables.     Fruits    Fruit juice. Cooked, strained fruit.    Meats and Other Protein Sources    Fatty cuts of meat. Fried Environmental education officer or fried fish.    Dairy    Milk. Yogurt. Cream cheese. Sour cream.    Beverages    Soft drinks.    Other    Cakes and pastries. Butter and oils.    The items listed above may not be a complete list of foods and beverages to avoid. Contact your dietitian for more information.    WHAT ARE SOME TIPS FOR INCLUDING HIGH-FIBER FOODS IN MY DIET?     Eat a wide variety of high-fiber foods.     Make sure that half of all grains consumed each day are whole grains.     Replace breads and cereals made from refined flour or white flour with whole-grain breads and cereals.     Replace white rice with brown rice, bulgur wheat, or millet.     Start the day with a breakfast that is high in fiber, such as a cereal that contains at least 5 grams of fiber per serving.     Use beans in place of meat in soups, salads, or pasta.     Eat high-fiber snacks, such as berries, raw vegetables, nuts, or popcorn.    This information is not intended to replace advice given to you by your health care provider. Make sure you discuss any questions you have with your health care provider.    Document Released: 09/18/2005 Document Revised: 10/09/2014 Document Reviewed: 03/03/2014  Elsevier Interactive Patient Education ?2016 Elsevier Inc.       Constipation, Adult    Constipation is when a person has fewer than three bowel movements a  week, has difficulty having a bowel movement, or has stools that are dry, hard, or larger than normal. As people grow older, constipation is more common.  A low-fiber  diet, not taking in enough fluids, and taking certain medicines may make constipation worse.     CAUSES     Certain medicines, such as antidepressants, pain medicine, iron supplements, antacids, and water pills. ?     Certain diseases, such as diabetes, irritable bowel syndrome (IBS), thyroid disease, or depression. ?     Not drinking enough water. ?     Not eating enough fiber-rich foods. ?     Stress or travel. ?     Lack of physical activity or exercise. ?     Ignoring the urge to have a bowel movement. ?     Using laxatives too much. ?    SIGNS AND SYMPTOMS     Having fewer than three bowel movements a week. ?     Straining to have a bowel movement. ?     Having stools that are hard, dry, or larger than normal. ?     Feeling full or bloated. ?     Pain in the lower abdomen. ?     Not feeling relief after having a bowel movement. ?    DIAGNOSIS    Your health care provider will take a medical history and perform a physical exam. Further testing may be done for severe constipation. Some tests may include:     A barium enema X-ray to examine your rectum, colon, and, sometimes, your small intestine. ?     A sigmoidoscopy to examine your lower colon. ?     A colonoscopy to examine your entire colon.     TREATMENT    Treatment will depend on the severity of your constipation and what is causing it. Some dietary treatments include drinking more fluids and eating more fiber-rich foods. Lifestyle treatments may include regular exercise. If these diet and lifestyle recommendations do not help, your health care provider may recommend taking over-the-counter laxative medicines to help you have bowel movements. Prescription medicines may be prescribed if over-the-counter medicines do not work.     HOME CARE INSTRUCTIONS     Eat foods that have a lot of fiber, such as fruits, vegetables, whole grains, and beans.     Limit foods high in fat and processed sugars, such as french fries, hamburgers, cookies, candies, and soda. ?      A fiber supplement may be added to your diet if you cannot get enough fiber from foods. ?     Drink enough fluids to keep your urine clear or pale yellow. ?     Exercise regularly or as directed by your health care provider. ?     Go to the restroom when you have the urge to go. Do not hold it. ?     Only take over-the-counter or prescription medicines as directed by your health care provider. Do not take other medicines for constipation without talking to your health care provider first. ?     SEEK IMMEDIATE MEDICAL CARE IF:     You have bright red blood in your stool. ?     Your constipation lasts for more than 4 days or gets worse. ?     You have abdominal or rectal pain. ?     You have thin, pencil-like stools. ?     You have unexplained weight loss.  MAKE SURE YOU:     Understand these instructions.     Will watch your condition.     Will get help right away if you are not doing well or get worse.    This information is not intended to replace advice given to you by your health care provider. Make sure you discuss any questions you have with your health care provider.    Document Released: 06/16/2004 Document Revised: 10/09/2014 Document Reviewed: 06/30/2013  Elsevier Interactive Patient Education ?2016 Elsevier Inc.         Allergy Info: traMADol; oxyCODONE; Lipitor     Medication Information:  [facility] provided you with a complete list of medications post discharge, if you have been instructed to stop taking a medication please ensure you also follow up with this information to your Primary Care Physician.  Unless otherwise noted, patient will continue to take medications as prescribed prior to the Emergency Room visit.  Any specific questions regarding your chronic medications and dosages should be discussed with your physician(s) and pharmacist.          acetaminophen (Tylenol Extra Strength 500 mg oral tablet) Oral (given by mouth) every 6 hours.  atenolol (atenolol 100 mg  oral tablet) 1 Tabs Oral (given by mouth) every day.  cyclobenzaprine (cyclobenzaprine 10 mg oral tablet) 1 Tabs Oral (given by mouth) 3 times a day as needed.,  THIS MEDICATION IS ASSOCIATED  WITH  AN INCREASED RISK OF FALLS.  dicyclomine (Bentyl 20 mg oral tablet) 1 Tabs Oral (given by mouth) 4 times a day as needed mild pain (1-3) for 5 Days. Refills: 0., for abdominal pain  docusate (Colace 50 mg oral capsule) 1 Capsules Oral (given by mouth) 2 times a day as needed for constipation. Refills: 0.  fluticasone nasal (fluticasone 50 mcg/inh nasal spray) 1 Sprays Nasal (into the nose) 2 times a day.  levothyroxine (levothyroxine 75 mcg (0.075 mg) oral tablet) 1 Tabs Oral (given by mouth) every day.  lidocaine topical (Lidoderm 5% topical film) 1 Patches Topical (on the skin) every day for 10 Days. remove patches after 12 hours. Refills: 0.  lidocaine topical (Lidoderm 5% topical film) 1 Patches Topical (on the skin) every day for 10 Days. remove patches after 12 hours. Refills: 0.  lisinopril (lisinopril 40 mg oral tablet) 1 Tabs Oral (given by mouth) every day.  omeprazole (PriLOSEC 20 mg oral delayed release capsule) 1 Capsules Oral (given by mouth) every day. Refills: 0.  ondansetron (ondansetron 4 mg oral tablet) 1 Tabs Oral (given by mouth) every 6 hours as needed as needed for nausea/vomiting for 3 Days. Refills: 0.  oxybutynin (oxybutynin 5 mg/24 hours oral tablet, extended release) 5 Milligram Oral (given by mouth) every day.  senna (senna 8.6 mg oral tablet) 2 Tabs Oral (given by mouth) Once a Day (at bedtime) as needed for constipation. Refills: 0.  simvastatin (simvastatin 20 mg oral tablet) 1 Tabs Oral (given by mouth) Once a Day (at bedtime).      Medications Administered During Visit:              Medication Dose Route   ketorolac 30 mg IM   dicyclomine 10 mg IM   docusate 100 mg Oral          Major Tests and Procedures:  The following procedures and tests were performed during your ED visit.  COMMON  PROCEDURES%>  COMMON PROCEDURES COMMENTS%>          Laboratory  Orders  No laboratory orders were placed.              Radiology Orders  Name Status Details   XR Abdomen 2 Views Ordered 02/20/19 2:41:00 EDT, STAT 1 hour or less, Reason: Constipation, Transport Mode: STRETCHER, [email or web address]               Patient Care Orders  Name Status Details   Discharge Patient Ordered 02/20/19 3:58:00 EDT   ED Assessment Adult Completed 02/20/19 2:11:23 EDT, 02/20/19 2:11:23 EDT   ED Secondary Triage Completed 02/20/19 2:11:23 EDT, 02/20/19 2:11:23 EDT   ED Triage Adult Completed 02/20/19 2:06:26 EDT, 02/20/19 2:06:26 EDT   Patient Specific Fall Safety Measures Completed 02/20/19 2:13:14 EDT, Once, 02/20/19 2:13:14 EDT, ED Low Fall Risk Documentation       ---------------------------------------------------------------------------------------------------------------------  [facility] allows you to manage your health, view your test results, and retrieve your discharge documents from your hospital stay securely and conveniently from your computer.     To begin the enrollment process, visit [email or web address] Click on "Sign up now" under [facility].   Comment:

## 2019-02-20 NOTE — ED Notes (Signed)
ED Patient Education Note     Patient Education Materials Follows:  Home Health Care     High-Fiber Diet    Fiber, also called dietary fiber, is a type of carbohydrate found in fruits, vegetables, whole grains, and beans. A high-fiber diet can have many health benefits. Your health care provider may recommend a high-fiber diet to help:     Prevent constipation. Fiber can make your bowel movements more regular.     Lower your cholesterol.     Relieve hemorrhoids, uncomplicated diverticulosis, or irritable bowel syndrome.     Prevent overeating as part of a weight-loss plan.     Prevent heart disease, type 2 diabetes, and certain cancers.    WHAT IS MY PLAN?    The recommended daily intake of fiber includes:     38 grams for men under age 50.     30 grams for men over age 50.     25 grams for women under age 50.     21 grams for women over age 50.    You can get the recommended daily intake of dietary fiber by eating a variety of fruits, vegetables, grains, and beans. Your health care provider may also recommend a fiber supplement if it is not possible to get enough fiber through your diet.    WHAT DO I NEED TO KNOW ABOUT A HIGH-FIBER DIET?     Fiber supplements have not been widely studied for their effectiveness, so it is better to get fiber through food sources.     Always check the fiber content on the?nutrition facts label of any prepackaged food. Look for foods that contain at least 5 grams of fiber per serving.     Ask your dietitian if you have questions about specific foods that are related to your condition, especially if those foods are not listed in the following section.     Increase your daily fiber consumption gradually. Increasing your intake of dietary fiber too quickly may cause bloating, cramping, or gas.     Drink plenty of water. Water helps you to digest fiber.    WHAT FOODS CAN I EAT?    Grains    Whole-grain breads. Multigrain cereal. Oats and oatmeal. Brown rice. Barley. Bulgur wheat. Millet.  Bran muffins. Popcorn. Rye wafer crackers.    Vegetables    Sweet potatoes. Spinach. Kale. Artichokes. Cabbage. Broccoli. Green peas. Carrots. Squash.    Fruits    Berries. Pears. Apples. Oranges. Avocados. Prunes and raisins. Dried figs.    Meats and Other Protein Sources    Navy, kidney, pinto, and soy beans. Split peas. Lentils. Nuts and seeds.    Dairy    Fiber-fortified yogurt.    Beverages    Fiber-fortified soy milk. Fiber-fortified orange juice.    Other    Fiber bars.    The items listed above may not be a complete list of recommended foods or beverages. Contact your dietitian for more options.    WHAT FOODS ARE NOT RECOMMENDED?    Grains    White bread. Pasta made with refined flour. White rice.    Vegetables    Fried potatoes. Canned vegetables. Well-cooked vegetables.     Fruits    Fruit juice. Cooked, strained fruit.    Meats and Other Protein Sources    Fatty cuts of meat. Fried poultry or fried fish.    Dairy    Milk. Yogurt. Cream cheese. Sour cream.    Beverages      Soft drinks.    Other    Cakes and pastries. Butter and oils.    The items listed above may not be a complete list of foods and beverages to avoid. Contact your dietitian for more information.    WHAT ARE SOME TIPS FOR INCLUDING HIGH-FIBER FOODS IN MY DIET?     Eat a wide variety of high-fiber foods.     Make sure that half of all grains consumed each day are whole grains.     Replace breads and cereals made from refined flour or white flour with whole-grain breads and cereals.     Replace white rice with brown rice, bulgur wheat, or millet.     Start the day with a breakfast that is high in fiber, such as a cereal that contains at least 5 grams of fiber per serving.     Use beans in place of meat in soups, salads, or pasta.     Eat high-fiber snacks, such as berries, raw vegetables, nuts, or popcorn.    This information is not intended to replace advice given to you by your health care provider. Make sure you discuss any questions you  have with your health care provider.    Document Released: 09/18/2005 Document Revised: 10/09/2014 Document Reviewed: 03/03/2014  Elsevier Interactive Patient Education ?2016 Elsevier Inc.         Constipation, Adult    Constipation is when a person has fewer than three bowel movements a week, has difficulty having a bowel movement, or has stools that are dry, hard, or larger than normal. As people grow older, constipation is more common.  A low-fiber diet, not taking in enough fluids, and taking certain medicines may make constipation worse.     CAUSES     Certain medicines, such as antidepressants, pain medicine, iron supplements, antacids, and water pills. ?     Certain diseases, such as diabetes, irritable bowel syndrome (IBS), thyroid disease, or depression. ?     Not drinking enough water. ?     Not eating enough fiber-rich foods. ?     Stress or travel. ?     Lack of physical activity or exercise. ?     Ignoring the urge to have a bowel movement. ?     Using laxatives too much. ?    SIGNS AND SYMPTOMS     Having fewer than three bowel movements a week. ?     Straining to have a bowel movement. ?     Having stools that are hard, dry, or larger than normal. ?     Feeling full or bloated. ?     Pain in the lower abdomen. ?     Not feeling relief after having a bowel movement. ?    DIAGNOSIS    Your health care provider will take a medical history and perform a physical exam. Further testing may be done for severe constipation. Some tests may include:     A barium enema X-ray to examine your rectum, colon, and, sometimes, your small intestine. ?     A sigmoidoscopy to examine your lower colon. ?     A colonoscopy to examine your entire colon.     TREATMENT    Treatment will depend on the severity of your constipation and what is causing it. Some dietary treatments include drinking more fluids and eating more fiber-rich foods. Lifestyle treatments may include regular exercise. If these diet and lifestyle  recommendations do not help, your   health care provider may recommend taking over-the-counter laxative medicines to help you have bowel movements. Prescription medicines may be prescribed if over-the-counter medicines do not work.     HOME CARE INSTRUCTIONS     Eat foods that have a lot of fiber, such as fruits, vegetables, whole grains, and beans.     Limit foods high in fat and processed sugars, such as french fries, hamburgers, cookies, candies, and soda. ?     A fiber supplement may be added to your diet if you cannot get enough fiber from foods. ?     Drink enough fluids to keep your urine clear or pale yellow. ?     Exercise regularly or as directed by your health care provider. ?     Go to the restroom when you have the urge to go. Do not hold it. ?     Only take over-the-counter or prescription medicines as directed by your health care provider. Do not take other medicines for constipation without talking to your health care provider first. ?     SEEK IMMEDIATE MEDICAL CARE IF:     You have bright red blood in your stool. ?     Your constipation lasts for more than 4 days or gets worse. ?     You have abdominal or rectal pain. ?     You have thin, pencil-like stools. ?     You have unexplained weight loss.     MAKE SURE YOU:     Understand these instructions.     Will watch your condition.     Will get help right away if you are not doing well or get worse.    This information is not intended to replace advice given to you by your health care provider. Make sure you discuss any questions you have with your health care provider.    Document Released: 06/16/2004 Document Revised: 10/09/2014 Document Reviewed: 06/30/2013  Elsevier Interactive Patient Education ?2016 Elsevier Inc.

## 2019-02-20 NOTE — ED Notes (Signed)
ED Pre-Arrival Note        Pre-Arrival Summary    Name:  Medic 15 - R foot pain ETA 5,    Current Date:  02/20/2019 02:06:57 EDT  Gender:  Female  Date of Birth:    Age:  53  Pre-Arrival Type:  EMS  ETA:  02/20/2019 02:26:00 EDT  Primary Care Physician:    Presenting Problem:    Pre-Arrival User:  PRICE, RN, Mertha Finders  Referring Source:    Location:  [facility]            PreArrival Communication Form  Emergency Department        Additional Patient Information:        Orders:  [    ] CBC                                            [     ] CT Head no contrast  [    ] BMP                                           [     ] CT Abdomen/Pelvis no contrast  [    ] PT/INR                                       [     ] CT Abdomen/Pelvis IV contrast, w/ oral contrast  [    ] Troponin                                   [     ] CT Abdomen/Pelvis IV contrast, no oral contrast  [    ] BNP                                            [     ] See ordersheet  [    ] CXR                                             [     ] Other:__________________________  [    ] EKG

## 2019-02-20 NOTE — ED Notes (Signed)
 ED Triage Note       ED Triage Adult Entered On:  02/20/2019 2:11 EDT    Performed On:  02/20/2019 2:07 EDT by Dannial, RN, Jaclynne A               Triage   Chief Complaint :   Pt c/o right hip pain, back pain and rib pain for awhile. Pt states her abdomen feels like she's on her cycle. pt seen here recently for bilateral knee pain. ambulatory to stretcher. NAD noted.    Numeric Rating Pain Scale :   10 = Worst possible pain   [place] Mode of Arrival :   Ambulance   Infectious Disease Documentation :   Document assessment   Temperature Oral :   36.7 degC(Converted to: 98.1 degF)    Heart Rate Monitored :   81 bpm   Respiratory Rate :   17 br/min   Systolic Blood Pressure :   138 mmHg   Diastolic Blood Pressure :   60 mmHg   SpO2 :   100 %   Oxygen Therapy :   Room air   Patient presentation :   Altered mental status, new onset, None of the above   Chief Complaint or Presentation suggest infection :   No   Dosing Weight Obtained By :   Patient stated   Weight Dosing :   93.1 kg(Converted to: 205 lb 4 oz)    Height :   167.6 cm(Converted to: 5 ft 6 in)    Body Mass Index Dosing :   33 kg/m2   Dannial, RN, [employer] A - 02/20/2019 2:07 EDT   DCP GENERIC CODE   Tracking Acuity :   3   Tracking Group :   ED [id number] Tracking [employer]   [place], RN, [place] A - 02/20/2019 2:07 EDT   ED General Section :   Document assessment   Pregnancy Status :   Patient denies   ED Allergies Section :   Document assessment   ED Reason for Visit Section :   Document assessment   Dannial, RN, Butch A - 02/20/2019 2:07 EDT   ID Risk Screen Symptoms   Recent Travel History :   No recent travel   Close Contact with COVID-19  ID :   No   Have you been tested for COVID-19 ID :   No   TB Symptom Screen :   No symptoms   C. diff Symptom/History ID :   Neither of the above   Dannial, RN, Jaclynne A - 02/20/2019 2:07 EDT   Allergies   (As Of: 02/20/2019 02:11:23 EDT)   Allergies (Active)   Lipitor  Estimated Onset Date:   Unspecified ; Created ByBETHA FLOR,  RN, LARA J; Reaction Status:   Active ; Category:   Drug ; Substance:   Lipitor ; Type:   Allergy ; Severity:   Mild ; Updated By:   FLOR RN, KARILYN PARAS; Reviewed Date:   02/20/2019 2:10 EDT      oxyCODONE  Estimated Onset Date:   Unspecified ; Reactions:   Hives ; Created By:   Dannial RN, Jaclynne A; Reaction Status:   Active ; Category:   Drug ; Substance:   oxyCODONE ; Type:   Allergy ; Severity:   Mild ; Updated By:   Dannial RN, Butch LABOR; Reviewed Date:   02/20/2019 2:10 EDT      traMADol  Estimated Onset Date:   Unspecified ;  Reactions:   Hives ; Created By:   Dannial, RN, Butch LABOR; Reaction Status:   Active ; Category:   Drug ; Substance:   traMADol ; Type:   Allergy ; Severity:   Mild ; Updated By:   Dannial RN, Butch LABOR; Reviewed Date:   02/20/2019 2:10 EDT        Psycho-Social   Last 3 mo, thoughts killing self/others :   Patient denies   Dannial OBIE Butch LABOR - 02/20/2019 2:07 EDT   ED Reason for Visit   (As Of: 02/20/2019 02:11:23 EDT)   Problems(Active)    HTN (SNOMED CT  :AZEGxwEmLzUcjRiawKgAAg )  Name of Problem:   HTN ; Recorder:   CABE, RN, ADAM Z; Confirmation:   Confirmed ; Classification:   Medical ; Code:   AZEGxwEmLzUcjRiawKgAAg ; Contributor System:   PowerChart ; Last Updated:   09/18/2016 13:23 EST ; Life Cycle Date:   09/18/2016 ; Life Cycle Status:   Active ; Vocabulary:   SNOMED CT        Hypercholesteremia (IMO  :53324 )  Name of Problem:   Hypercholesteremia ; Recorder:   CABE, RN, ADAM Z; Confirmation:   Confirmed ; Classification:   Medical ; Code:   53324 ; Contributor System:   PowerChart ; Last Updated:   09/18/2016 13:23 EST ; Life Cycle Date:   09/18/2016 ; Life Cycle Status:   Active ; Vocabulary:   IMO        Hypothyroid (IMO  :B9796911 )  Name of Problem:   Hypothyroid ; Recorder:   MALONE, RN, JENNIFER L; Confirmation:   Confirmed ; Classification:   Medical ; Code:   [id number] ; Contributor System:   PowerChart ; Last Updated:   09/19/2016 14:07 EST ; Life Cycle Date:    09/19/2016 ; Life Cycle Status:   Active ; Vocabulary:   IMO          Diagnoses(Active)    Hip pain-swelling  Date:   02/20/2019 ; Diagnosis Type:   Reason For Visit ; Confirmation:   Complaint of ; Clinical Dx:   Hip pain-swelling ; Classification:   Medical ; Clinical Service:   Non-Specified ; Code:   PNED ; Probability:   0 ; Diagnosis Code:   [id number]

## 2019-02-20 NOTE — ED Notes (Signed)
 ED Triage Note       ED Secondary Triage Entered On:  02/20/2019 2:13 EDT    Performed On:  02/20/2019 2:11 EDT by Dannial, RN, Butch LABOR               General Information   Barriers to Learning :   None evident   Languages :   English   ED Home Meds Section :   Document assessment   [facility] ED Fall Risk Section :   Document assessment   ED Advance Directives Section :   Document assessment   Dannial, RN, Butch A - 02/20/2019 2:11 EDT   (As Of: 02/20/2019 02:13:14 EDT)   Problems(Active)    HTN (SNOMED CT  :AZEGxwEmLzUcjRiawKgAAg )  Name of Problem:   HTN ; Recorder:   CABE, RN, ADAM Z; Confirmation:   Confirmed ; Classification:   Medical ; Code:   AZEGxwEmLzUcjRiawKgAAg ; Contributor System:   PowerChart ; Last Updated:   09/18/2016 13:23 EST ; Life Cycle Date:   09/18/2016 ; Life Cycle Status:   Active ; Vocabulary:   SNOMED CT        Hypercholesteremia (IMO  :53324 )  Name of Problem:   Hypercholesteremia ; Recorder:   CABE, RN, ADAM Z; Confirmation:   Confirmed ; Classification:   Medical ; Code:   53324 ; Contributor System:   PowerChart ; Last Updated:   09/18/2016 13:23 EST ; Life Cycle Date:   09/18/2016 ; Life Cycle Status:   Active ; Vocabulary:   IMO        Hypothyroid (IMO  :[id number] )  Name of Problem:   Hypothyroid ; Recorder:   MALONE, RN, JENNIFER L; Confirmation:   Confirmed ; Classification:   Medical ; Code:   [id number] ; Contributor System:   PowerChart ; Last Updated:   09/19/2016 14:07 EST ; Life Cycle Date:   09/19/2016 ; Life Cycle Status:   Active ; Vocabulary:   IMO          Diagnoses(Active)    Hip pain-swelling  Date:   02/20/2019 ; Diagnosis Type:   Reason For Visit ; Confirmation:   Complaint of ; Clinical Dx:   Hip pain-swelling ; Classification:   Medical ; Clinical Service:   Non-Specified ; Code:   PNED ; Probability:   0 ; Diagnosis Code:   [id number]             -    Procedure History   (As Of: 02/20/2019 02:13:14 EDT)     Anesthesia Minutes:   0 ; Procedure  Name:   VP shunt ; Procedure Minutes:   0            Anesthesia Minutes:   0 ; Procedure Name:   Cholecystectomy ; Procedure Minutes:   0            [facility] Fall Risk Assessment Tool   Hx of falling last 3 months ED Fall :   Yes (Single mechanical fall)   Patient confused or disoriented ED Fall :   No   Patient intoxicated or sedated ED Fall :   No   Patient impaired gait ED Fall :   No   Use a mobility assistance device ED Fall :   No   Patient altered elimination ED Fall :   No   [facility] ED Fall Score :   1    Dannial, RN, Butch LABOR - 02/20/2019 2:11 EDT   ED Advance  Directive   Advance Directive :   No   [place], RN, Jaclynne A - 02/20/2019 2:11 EDT   Med Hx   Medication List   (As Of: 02/20/2019 02:13:14 EDT)   Prescription/Discharge Order    docusate  :   docusate ; Status:   Prescribed ; Ordered As Mnemonic:   Colace 50 mg oral capsule ; Simple Display Line:   50 mg, 1 caps, Oral, BID, PRN: for constipation, 60 caps, 0 Refill(s) ; Ordering Provider:   KING-MD,  MELANIE ELAINE; Catalog Code:   docusate ; Order Dt/Tm:   02/14/2019 07:17:48 EDT          lidocaine topical  :   lidocaine topical ; Status:   Prescribed ; Ordered As Mnemonic:   Lidoderm 5% topical film ; Simple Display Line:   1 patches, Topical, Daily, for 10 days, remove patches after 12 hours, 10 patches, 0 Refill(s) ; Ordering Provider:   LAURETHA NIEMANN; Catalog Code:   lidocaine topical ; Order Dt/Tm:   02/11/2019 03:05:01 EDT          lidocaine topical  :   lidocaine topical ; Status:   Prescribed ; Ordered As Mnemonic:   Lidoderm 5% topical film ; Simple Display Line:   1 patches, Topical, Daily, for 10 days, remove patches after 12 hours, 10 patches, 0 Refill(s) ; Ordering Provider:   LAURETHA NIEMANN; Catalog Code:   lidocaine topical ; Order Dt/Tm:   02/06/2019 79:93:73 EDT          dicyclomine  :   dicyclomine ; Status:   Prescribed ; Ordered As Mnemonic:   Bentyl 20 mg oral tablet ; Simple Display Line:   20 mg, 1 tabs, Oral, QID, for 5  days, PRN: mild pain (1-3), 20 tabs, 0 Refill(s) ; Ordering Provider:   MARIAH SELINDA LABOR; Catalog Code:   dicyclomine ; Order Dt/Tm:   01/04/2019 11:56:50 EDT ; Comment:   for abdominal pain          ondansetron  :   ondansetron ; Status:   Prescribed ; Ordered As Mnemonic:   ondansetron 4 mg oral tablet ; Simple Display Line:   4 mg, 1 tabs, Oral, q6hr, for 3 days, PRN: as needed for nausea/vomiting, 20 tabs, 0 Refill(s) ; Ordering Provider:   MARIAH SELINDA LABOR; Catalog Code:   ondansetron ; Order Dt/Tm:   01/04/2019 11:56:53 EDT          omeprazole  :   omeprazole ; Status:   Prescribed ; Ordered As Mnemonic:   PriLOSEC 20 mg oral delayed release capsule ; Simple Display Line:   20 mg, 1 caps, Oral, Daily, 30 caps, 0 Refill(s) ; Ordering Provider:   PRICE-MD,  KEVIN CHARLES; Catalog Code:   omeprazole ; Order Dt/Tm:   02/19/2018 23:32:47 EDT          meloxicam  :   meloxicam ; Status:   Voided ; Ordered As Mnemonic:   meloxicam 7.5 mg oral tablet ; Simple Display Line:   7.5 mg, 1 tabs, Oral, Daily, pain, PRN: other (see comment), 30 tabs, 0 Refill(s) ; Ordering Provider:   CANDIDA ALM NED; Catalog Code:   meloxicam ; Order Dt/Tm:   09/18/2016 16:55:32 EST            Home Meds    acetaminophen  :   acetaminophen ; Status:   Documented ; Ordered As Mnemonic:   Tylenol Extra Strength 500 mg  oral tablet ; Simple Display Line:   mg, tabs, Oral, q6hr, 0 Refill(s) ; Catalog Code:   acetaminophen ; Order Dt/Tm:   01/04/2019 10:30:40 EDT          cyclobenzaprine  :   cyclobenzaprine ; Status:   Documented ; Ordered As Mnemonic:   cyclobenzaprine 10 mg oral tablet ; Simple Display Line:   10 mg, 1 tabs, Oral, TID, PRN, 30 tabs, 0 Refill(s) ; Catalog Code:   cyclobenzaprine ; Order Dt/Tm:   09/29/2016 04:32:44 EST ; Comment:    THIS MEDICATION IS ASSOCIATED   WITH   AN INCREASED RISK OF FALLS.          atenolol  :   atenolol ; Status:   Documented ; Ordered As Mnemonic:   atenolol 100 mg oral tablet ; Simple Display  Line:   100 mg, 1 tabs, Oral, Daily, 30 tabs, 0 Refill(s) ; Catalog Code:   atenolol ; Order Dt/Tm:   09/19/2016 14:08:51 EST          fluticasone nasal  :   fluticasone nasal ; Status:   Documented ; Ordered As Mnemonic:   fluticasone 50 mcg/inh nasal spray ; Simple Display Line:   1 sprays, Nasal, BID, 16 g, 0 Refill(s) ; Catalog Code:   fluticasone nasal ; Order Dt/Tm:   09/19/2016 14:08:51 EST          levothyroxine  :   levothyroxine ; Status:   Documented ; Ordered As Mnemonic:   levothyroxine 75 mcg (0.075 mg) oral tablet ; Simple Display Line:   75 mcg, 1 tabs, Oral, Daily, 30 tabs, 0 Refill(s) ; Catalog Code:   levothyroxine ; Order Dt/Tm:   09/19/2016 14:08:51 EST          lisinopril  :   lisinopril ; Status:   Documented ; Ordered As Mnemonic:   lisinopril 40 mg oral tablet ; Simple Display Line:   40 mg, 1 tabs, Oral, Daily, 30 tabs, 0 Refill(s) ; Catalog Code:   lisinopril ; Order Dt/Tm:   09/19/2016 14:08:51 EST          oxybutynin  :   oxybutynin ; Status:   Documented ; Ordered As Mnemonic:   oxybutynin 5 mg/24 hours oral tablet, extended release ; Simple Display Line:   5 mg, 1 tabs, Oral, Daily, 30 tabs, 0 Refill(s) ; Catalog Code:   oxybutynin ; Order Dt/Tm:   09/19/2016 14:08:51 EST          simvastatin  :   simvastatin ; Status:   Documented ; Ordered As Mnemonic:   simvastatin 20 mg oral tablet ; Simple Display Line:   20 mg, 1 tabs, Oral, Once a Day (at bedtime), 0 Refill(s) ; Catalog Code:   simvastatin ; Order Dt/Tm:   09/19/2016 14:08:51 EST

## 2019-04-04 NOTE — ED Notes (Signed)
ED Note-Nursing       ED RN Reassessment Entered On:  04/04/2019 10:58 EDT    Performed On:  04/04/2019 10:57 EDT by Adela Lank, RN, Prudence Davidson               ED RN Reassessment   ED Patient condition :   Alert   ED RN Progress Note :   patient reported that she did not want to be evaluated any longer. patient wished to leave. patient walked out of the department with strong and erect gait. patient alert and oriented x4. RN notified by MD of patient's decision.   Adela Lank RN, Prudence Davidson - 04/04/2019 10:57 EDT
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
## 2019-04-04 NOTE — ED Notes (Signed)
ED Patient Education Note     Patient Education Materials Follows:
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
## 2019-04-04 NOTE — ED Notes (Signed)
 ED Triage Note       ED Secondary Triage Entered On:  04/04/2019 10:56 EDT    Performed On:  04/04/2019 10:56 EDT by Emil, RN, Hoy SAUNDERS               General Information   Barriers to Learning :   None evident   ED Home Meds Section :   Document assessment   [facility] ED Fall Risk Section :   Document assessment   ED Advance Directives Section :   Document assessment   ED Palliative Screen :   N/A (prefilled for <52yo)   Emil OBIE Hoy SAUNDERS - 04/04/2019 10:56 EDT   (As Of: 04/04/2019 10:56:58 EDT)   Problems(Active)    HTN (SNOMED CT  :AZEGxwEmLzUcjRiawKgAAg )  Name of Problem:   HTN ; Recorder:   CABE, RN, ADAM Z; Confirmation:   Confirmed ; Classification:   Medical ; Code:   AZEGxwEmLzUcjRiawKgAAg ; Contributor System:   PowerChart ; Last Updated:   09/18/2016 13:23 EST ; Life Cycle Date:   09/18/2016 ; Life Cycle Status:   Active ; Vocabulary:   SNOMED CT        Hypercholesteremia (IMO  :53324 )  Name of Problem:   Hypercholesteremia ; Recorder:   CABE, RN, ADAM Z; Confirmation:   Confirmed ; Classification:   Medical ; Code:   53324 ; Contributor System:   PowerChart ; Last Updated:   09/18/2016 13:23 EST ; Life Cycle Date:   09/18/2016 ; Life Cycle Status:   Active ; Vocabulary:   IMO        Hypothyroid (IMO  :[id number] )  Name of Problem:   Hypothyroid ; Recorder:   MALONE, RN, JENNIFER L; Confirmation:   Confirmed ; Classification:   Medical ; Code:   [id number] ; Contributor System:   PowerChart ; Last Updated:   09/19/2016 14:07 EST ; Life Cycle Date:   09/19/2016 ; Life Cycle Status:   Active ; Vocabulary:   IMO          Diagnoses(Active)    Medical screening exam  Date:   04/04/2019 ; Diagnosis Type:   Reason For Visit ; Confirmation:   Complaint of ; Clinical Dx:   Medical screening exam ; Classification:   Medical ; Clinical Service:   Non-Specified ; Code:   PNED ; Probability:   0 ; Diagnosis Code:   [id number]             -    Procedure History   (As Of: 04/04/2019 10:56:58 EDT)      Anesthesia Minutes:   0 ; Procedure Name:   VP shunt ; Procedure Minutes:   0            Anesthesia Minutes:   0 ; Procedure Name:   Cholecystectomy ; Procedure Minutes:   0            [facility] Fall Risk Assessment Tool   Hx of falling last 3 months ED Fall :   No   Patient confused or disoriented ED Fall :   No   Patient intoxicated or sedated ED Fall :   No   Patient impaired gait ED Fall :   No   Use a mobility assistance device ED Fall :   No   Patient altered elimination ED Fall :   No   [facility] ED Fall Score :   0    Emil OBIE Hoy SAUNDERS - 04/04/2019 10:56 EDT  ED Advance Directive   Advance Directive :   No   Emil RN, Hoy SAUNDERS - 04/04/2019 10:56 EDT   Med Hx   Medication List   (As Of: 04/04/2019 10:56:58 EDT)   Prescription/Discharge Order    senna  :   senna ; Status:   Prescribed ; Ordered As Mnemonic:   senna 8.6 mg oral tablet ; Simple Display Line:   17.2 mg, 2 tabs, Oral, Once a Day (at bedtime), PRN: for constipation, 20 tabs, 0 Refill(s) ; Ordering Provider:   MILLER-DO,  MEGAN E; Catalog Code:   senna ; Order Dt/Tm:   02/20/2019 03:56:44 EDT          docusate  :   docusate ; Status:   Prescribed ; Ordered As Mnemonic:   Colace 50 mg oral capsule ; Simple Display Line:   50 mg, 1 caps, Oral, BID, PRN: for constipation, 60 caps, 0 Refill(s) ; Ordering Provider:   KING-MD,  MELANIE ELAINE; Catalog Code:   docusate ; Order Dt/Tm:   02/14/2019 07:17:48 EDT          lidocaine topical  :   lidocaine topical ; Status:   Prescribed ; Ordered As Mnemonic:   Lidoderm 5% topical film ; Simple Display Line:   1 patches, Topical, Daily, for 10 days, remove patches after 12 hours, 10 patches, 0 Refill(s) ; Ordering Provider:   LAURETHA NIEMANN; Catalog Code:   lidocaine topical ; Order Dt/Tm:   02/11/2019 03:05:01 EDT          lidocaine topical  :   lidocaine topical ; Status:   Prescribed ; Ordered As Mnemonic:   Lidoderm 5% topical film ; Simple Display Line:   1 patches, Topical, Daily, for 10 days, remove  patches after 12 hours, 10 patches, 0 Refill(s) ; Ordering Provider:   LAURETHA NIEMANN; Catalog Code:   lidocaine topical ; Order Dt/Tm:   02/06/2019 79:93:73 EDT          dicyclomine  :   dicyclomine ; Status:   Prescribed ; Ordered As Mnemonic:   Bentyl 20 mg oral tablet ; Simple Display Line:   20 mg, 1 tabs, Oral, QID, for 5 days, PRN: mild pain (1-3), 20 tabs, 0 Refill(s) ; Ordering Provider:   MARIAH SELINDA LABOR; Catalog Code:   dicyclomine ; Order Dt/Tm:   01/04/2019 11:56:50 EDT ; Comment:   for abdominal pain          ondansetron  :   ondansetron ; Status:   Prescribed ; Ordered As Mnemonic:   ondansetron 4 mg oral tablet ; Simple Display Line:   4 mg, 1 tabs, Oral, q6hr, for 3 days, PRN: as needed for nausea/vomiting, 20 tabs, 0 Refill(s) ; Ordering Provider:   MARIAH SELINDA LABOR; Catalog Code:   ondansetron ; Order Dt/Tm:   01/04/2019 11:56:53 EDT          omeprazole  :   omeprazole ; Status:   Prescribed ; Ordered As Mnemonic:   PriLOSEC 20 mg oral delayed release capsule ; Simple Display Line:   20 mg, 1 caps, Oral, Daily, 30 caps, 0 Refill(s) ; Ordering Provider:   PRICE-MD,  KEVIN CHARLES; Catalog Code:   omeprazole ; Order Dt/Tm:   02/19/2018 23:32:47 EDT            Home Meds    acetaminophen  :   acetaminophen ; Status:   Documented ; Ordered As Mnemonic:   Tylenol  Extra Strength 500 mg oral tablet ; Simple Display Line:   mg, tabs, Oral, q6hr, 0 Refill(s) ; Catalog Code:   acetaminophen ; Order Dt/Tm:   01/04/2019 10:30:40 EDT          cyclobenzaprine  :   cyclobenzaprine ; Status:   Documented ; Ordered As Mnemonic:   cyclobenzaprine 10 mg oral tablet ; Simple Display Line:   10 mg, 1 tabs, Oral, TID, PRN, 30 tabs, 0 Refill(s) ; Catalog Code:   cyclobenzaprine ; Order Dt/Tm:   09/29/2016 04:32:44 EST ; Comment:    THIS MEDICATION IS ASSOCIATED   WITH   AN INCREASED RISK OF FALLS.          atenolol  :   atenolol ; Status:   Documented ; Ordered As Mnemonic:   atenolol 100 mg oral tablet ; Simple  Display Line:   100 mg, 1 tabs, Oral, Daily, 30 tabs, 0 Refill(s) ; Catalog Code:   atenolol ; Order Dt/Tm:   09/19/2016 14:08:51 EST          fluticasone nasal  :   fluticasone nasal ; Status:   Documented ; Ordered As Mnemonic:   fluticasone 50 mcg/inh nasal spray ; Simple Display Line:   1 sprays, Nasal, BID, 16 g, 0 Refill(s) ; Catalog Code:   fluticasone nasal ; Order Dt/Tm:   09/19/2016 14:08:51 EST          levothyroxine  :   levothyroxine ; Status:   Documented ; Ordered As Mnemonic:   levothyroxine 75 mcg (0.075 mg) oral tablet ; Simple Display Line:   75 mcg, 1 tabs, Oral, Daily, 30 tabs, 0 Refill(s) ; Catalog Code:   levothyroxine ; Order Dt/Tm:   09/19/2016 14:08:51 EST          lisinopril  :   lisinopril ; Status:   Documented ; Ordered As Mnemonic:   lisinopril 40 mg oral tablet ; Simple Display Line:   40 mg, 1 tabs, Oral, Daily, 30 tabs, 0 Refill(s) ; Catalog Code:   lisinopril ; Order Dt/Tm:   09/19/2016 14:08:51 EST          oxybutynin  :   oxybutynin ; Status:   Documented ; Ordered As Mnemonic:   oxybutynin 5 mg/24 hours oral tablet, extended release ; Simple Display Line:   5 mg, 1 tabs, Oral, Daily, 30 tabs, 0 Refill(s) ; Catalog Code:   oxybutynin ; Order Dt/Tm:   09/19/2016 14:08:51 EST          simvastatin  :   simvastatin ; Status:   Documented ; Ordered As Mnemonic:   simvastatin 20 mg oral tablet ; Simple Display Line:   20 mg, 1 tabs, Oral, Once a Day (at bedtime), 0 Refill(s) ; Catalog Code:   simvastatin ; Order Dt/Tm:   09/19/2016 14:08:51 EST

## 2019-04-13 ENCOUNTER — Other Ambulatory Visit: Payer: Self-pay

## 2019-04-13 ENCOUNTER — Encounter (HOSPITAL_COMMUNITY): Payer: Self-pay

## 2019-04-13 ENCOUNTER — Emergency Department (HOSPITAL_COMMUNITY)
Admission: EM | Admit: 2019-04-13 | Discharge: 2019-04-13 | Disposition: A | Discharge status: Home / Self Care | Payer: Medicare Other | Attending: Emergency Medicine | Admitting: Emergency Medicine

## 2019-04-13 DIAGNOSIS — R197 Diarrhea, unspecified: Secondary | ICD-10-CM | POA: Insufficient documentation

## 2019-04-13 DIAGNOSIS — I10 Essential (primary) hypertension: Secondary | ICD-10-CM | POA: Diagnosis not present

## 2019-04-13 DIAGNOSIS — Z79899 Other long term (current) drug therapy: Secondary | ICD-10-CM | POA: Diagnosis not present

## 2019-04-13 HISTORY — DX: Essential (primary) hypertension: I10

## 2019-04-13 LAB — COMPREHENSIVE METABOLIC PANEL
ALT: 26 U/L (ref 0–44)
ALT: 22 U/L (ref 0–44)
AST: 27 U/L (ref 15–41)
AST: 24 U/L (ref 15–41)
Albumin: 4.9 g/dL (ref 3.5–5.0)
Albumin: 4.3 g/dL (ref 3.5–5.0)
Alkaline Phosphatase: 83 U/L (ref 38–126)
Alkaline Phosphatase: 77 U/L (ref 38–126)
Anion gap: 9 (ref 5–15)
Anion gap: 7 (ref 5–15)
BUN: 17 mg/dL (ref 6–20)
BUN: 14 mg/dL (ref 6–20)
CO2: 32 mmol/L (ref 22–32)
CO2: 28 mmol/L (ref 22–32)
Calcium: 9.5 mg/dL (ref 8.9–10.3)
Calcium: 8.7 mg/dL — ABNORMAL LOW (ref 8.9–10.3)
Chloride: 103 mmol/L (ref 98–111)
Chloride: 106 mmol/L (ref 98–111)
Creatinine, Ser: 0.81 mg/dL (ref 0.44–1.00)
Creatinine, Ser: 0.62 mg/dL (ref 0.44–1.00)
GFR calc Af Amer: >60 mL/min (ref >60)
GFR calc Af Amer: >60 mL/min (ref >60)
GFR calc non Af Amer: >60 mL/min (ref >60)
GFR calc non Af Amer: >60 mL/min (ref >60)
Glucose, Bld: 129 mg/dL — ABNORMAL HIGH (ref 70–99)
Glucose, Bld: 106 mg/dL — ABNORMAL HIGH (ref 70–99)
Potassium: 3.4 mmol/L — ABNORMAL LOW (ref 3.5–5.1)
Potassium: 3.4 mmol/L — ABNORMAL LOW (ref 3.5–5.1)
Sodium: 144 mmol/L (ref 135–145)
Sodium: 141 mmol/L (ref 135–145)
Total Bilirubin: 0.4 mg/dL (ref 0.3–1.2)
Total Bilirubin: 0.4 mg/dL (ref 0.3–1.2)
Total Protein: 8 g/dL (ref 6.5–8.1)
Total Protein: 7.4 g/dL (ref 6.5–8.1)

## 2019-04-13 LAB — CBC WITH DIFFERENTIAL/PLATELET
Abs Immature Granulocytes: 0.01 10*3/uL (ref 0.00–0.07)
Basophils Absolute: 0 10*3/uL (ref 0.0–0.1)
Basophils Relative: 0 %
Eosinophils Absolute: 0.1 10*3/uL (ref 0.0–0.5)
Eosinophils Relative: 1 %
HCT: 36.7 % (ref 36.0–46.0)
Hemoglobin: 11.2 g/dL — ABNORMAL LOW (ref 12.0–15.0)
Immature Granulocytes: 0 %
Lymphocytes Relative: 27 %
Lymphs Abs: 2 10*3/uL (ref 0.7–4.0)
MCH: 30.1 pg (ref 26.0–34.0)
MCHC: 30.5 g/dL (ref 30.0–36.0)
MCV: 98.7 fL (ref 80.0–100.0)
Monocytes Absolute: 0.8 10*3/uL (ref 0.1–1.0)
Monocytes Relative: 12 %
Neutro Abs: 4.3 10*3/uL (ref 1.7–7.7)
Neutrophils Relative %: 60 %
Platelets: 250 10*3/uL (ref 150–400)
RBC: 3.72 MIL/uL — ABNORMAL LOW (ref 3.87–5.11)
RDW: 12.3 % (ref 11.5–15.5)
WBC: 7.2 10*3/uL (ref 4.0–10.5)
nRBC: 0 % (ref 0.0–0.2)

## 2019-04-13 LAB — C DIFFICILE QUICK SCREEN W PCR REFLEX
C Diff antigen: NEGATIVE
C Diff interpretation: NOT DETECTED
C Diff toxin: NEGATIVE

## 2019-04-13 LAB — LIPASE, BLOOD: Lipase: 26 U/L (ref 11–51)

## 2019-04-13 MED ORDER — ATENOLOL 100 MG PO TABS
100.0000 mg | ORAL_TABLET | Freq: Once | ORAL | Status: AC
  Administered 2019-04-13: 100 mg via ORAL
  Filled 2019-04-13: qty 1

## 2019-04-13 MED ORDER — LOPERAMIDE HCL 2 MG PO CAPS
4.0000 mg | ORAL_CAPSULE | Freq: Once | ORAL | Status: AC
  Administered 2019-04-13: 4 mg via ORAL
  Filled 2019-04-13: qty 2

## 2019-04-13 MED ORDER — SODIUM CHLORIDE 0.9 % IV BOLUS
1000.0000 mL | Freq: Once | INTRAVENOUS | Status: AC
  Administered 2019-04-13: 05:00:00 1000 mL via INTRAVENOUS

## 2019-04-13 MED ORDER — LISINOPRIL 20 MG PO TABS
40.0000 mg | ORAL_TABLET | Freq: Once | ORAL | Status: AC
  Administered 2019-04-13: 40 mg via ORAL
  Filled 2019-04-13: qty 2

## 2019-04-13 NOTE — ED Provider Notes (Signed)
[facility] DEPT Provider Note   CSN: [id number] Arrival date & time: 04/13/19  0417     History   Chief Complaint Chief Complaint  Patient presents with  . Diarrhea    HPI Andrea Bryan is a 53 y.o. female.     Patient to ED with profuse, constant, nonbloody diarrhea since last evening (approximately 5-6 hours ago). She reports having dinner last night and feeling "full", or constipated. She took Dulcolax liquid, stating she drank 1/2 the bottle. About 1-2 hours later, she had not had a bowel movement and drank the other half of the bottle. Since that time, she has been experiencing abdominal cramping just before a BM, and multiple, ongoing stools. No nausea, vomiting, lightheadedness.   The history is provided by the patient. No language interpreter was used.    Past Medical History:  Diagnosis Date  . Hypertension     There are no active problems to display for this patient.   History reviewed. No pertinent surgical history.   OB History   No obstetric history on file.      Home Medications    Prior to Admission medications   Medication Sig Start Date End Date Taking? Authorizing Provider  atenolol (TENORMIN) 100 MG tablet Take 100 mg by mouth daily. 09/19/16  Yes [provider]  cyclobenzaprine (FLEXERIL) 10 MG tablet Take 10 mg by mouth 3 (three) times daily as needed for muscle spasms.  09/29/16  Yes [provider]  fluticasone (FLONASE) 50 MCG/ACT nasal spray Place 1 spray into both nostrils daily as needed for allergies.  09/19/16  Yes [provider]  levothyroxine (SYNTHROID) 75 MCG tablet Take 75 mcg by mouth daily before breakfast. 09/19/16  Yes [provider]  lisinopril (ZESTRIL) 40 MG tablet Take 20 mg by mouth daily. 09/19/16  Yes [provider]  loratadine (CLARITIN) 10 MG tablet Take 10 mg by mouth daily as needed for allergies.    Yes [provider]  omeprazole  (PRILOSEC) 20 MG capsule Take 20 mg by mouth daily. 02/19/18  Yes [provider]  oxybutynin (DITROPAN-XL) 5 MG 24 hr tablet Take 5 mg by mouth daily. 09/19/16  Yes [provider]  simvastatin (ZOCOR) 20 MG tablet Take 20 mg by mouth daily. 09/19/16  Yes [provider]    Family History No family history on file.  Social History Social History   Tobacco Use  . Smoking status: Not on file  Substance Use Topics  . Alcohol use: Not on file  . Drug use: Not on file     Allergies   Metformin, Atorvastatin, Oxycodone, and Tramadol   Review of Systems Review of Systems  Constitutional: Negative for chills and fever.  Respiratory: Negative.   Cardiovascular: Negative.   Gastrointestinal: Positive for abdominal pain and diarrhea. Negative for blood in stool, nausea and vomiting.  Genitourinary: Negative.   Musculoskeletal: Negative.   Neurological: Negative.  Negative for syncope and light-headedness.     Physical Exam Updated Vital Signs BP (!) 162/76 (BP Location: Left Arm)   Pulse 77   Temp 98 F (36.7 C) (Oral)   Resp 16   Ht 5\' 7"  (1.702 m)   Wt 90.7 kg   SpO2 100%   BMI 31.32 kg/m   Physical Exam Vitals signs and nursing note reviewed.  Constitutional:      General: She is not in acute distress.    Appearance: She is not ill-appearing.  Cardiovascular:  Rate and Rhythm: Normal rate.  Pulmonary:     Effort: Pulmonary effort is normal. No respiratory distress.  Abdominal:     General: There is no distension.     Palpations: Abdomen is soft.     Tenderness: There is no abdominal tenderness.  Musculoskeletal: Normal range of motion.  Skin:    General: Skin is warm and dry.  Neurological:     Mental Status: She is oriented to person, place, and time.      ED Treatments / Results  Labs (all labs ordered are listed, but only abnormal results are displayed) Labs Reviewed  CBC WITH DIFFERENTIAL/PLATELET - Abnormal; Notable  for the following components:      Result Value   RBC 3.72 (*)    Hemoglobin 11.2 (*)    All other components within normal limits  COMPREHENSIVE METABOLIC PANEL - Abnormal; Notable for the following components:   Potassium 3.4 (*)    Glucose, Bld 129 (*)    All other components within normal limits  C DIFFICILE QUICK SCREEN W PCR REFLEX  GASTROINTESTINAL PANEL BY PCR, STOOL (REPLACES STOOL CULTURE)  LIPASE, BLOOD   Results for orders placed or performed during the hospital encounter of 04/13/19  CBC with Differential/Platelet  Result Value Ref Range   WBC 7.2 4.0 - 10.5 K/uL   RBC 3.72 (L) 3.87 - 5.11 MIL/uL   Hemoglobin 11.2 (L) 12.0 - 15.0 g/dL   HCT 36.7 36.0 - 46.0 %   MCV 98.7 80.0 - 100.0 fL   MCH 30.1 26.0 - 34.0 pg   MCHC 30.5 30.0 - 36.0 g/dL   RDW 12.3 11.5 - 15.5 %   Platelets 250 150 - 400 K/uL   nRBC 0.0 0.0 - 0.2 %   Neutrophils Relative % 60 %   Neutro Abs 4.3 1.7 - 7.7 K/uL   Lymphocytes Relative 27 %   Lymphs Abs 2.0 0.7 - 4.0 K/uL   Monocytes Relative 12 %   Monocytes Absolute 0.8 0.1 - 1.0 K/uL   Eosinophils Relative 1 %   Eosinophils Absolute 0.1 0.0 - 0.5 K/uL   Basophils Relative 0 %   Basophils Absolute 0.0 0.0 - 0.1 K/uL   Immature Granulocytes 0 %   Abs Immature Granulocytes 0.01 0.00 - 0.07 K/uL  Comprehensive metabolic panel  Result Value Ref Range   Sodium 144 135 - 145 mmol/L   Potassium 3.4 (L) 3.5 - 5.1 mmol/L   Chloride 103 98 - 111 mmol/L   CO2 32 22 - 32 mmol/L   Glucose, Bld 129 (H) 70 - 99 mg/dL   BUN 17 6 - 20 mg/dL   Creatinine, Ser 0.81 0.44 - 1.00 mg/dL   Calcium 9.5 8.9 - 10.3 mg/dL   Total Protein 8.0 6.5 - 8.1 g/dL   Albumin 4.9 3.5 - 5.0 g/dL   AST 27 15 - 41 U/L   ALT 26 0 - 44 U/L   Alkaline Phosphatase 83 38 - 126 U/L   Total Bilirubin 0.4 0.3 - 1.2 mg/dL   GFR calc non Af Amer >60 >60 mL/min   GFR calc Af Amer >60 >60 mL/min   Anion gap 9 5 - 15  Lipase, blood  Result Value Ref Range   Lipase 26 11 - 51 U/L      EKG None  Radiology No results found.  Procedures Procedures (including critical care time)  Medications Ordered in ED Medications  loperamide (IMODIUM) capsule 4 mg (has no administration in time  range)  sodium chloride 0.9 % bolus 1,000 mL (1,000 mLs Intravenous New Bag/Given 04/13/19 0505)     Initial Impression / Assessment and Plan / ED Course  I have reviewed the triage vital signs and the nursing notes.  Pertinent labs & imaging results that were available during my care of the patient were reviewed by me and considered in my medical decision making (see chart for details).        Patient to ED with profuse, nonbloody diarrhea since taking overdose of Dulcolax last evening. No vomiting or significant/constant abdominal pain.   Review of Dulcolax ingestion with pharmacy who identifies that the patient received a total of: 118 mg potasium  14,042 mg magnesium 472 mg calcium 71 mg sodium  Per Poison Control:  Provide IVF's to rehydrate; Bmet, cardiac monitoring, repeat Bmet in 6 hours. ICU admission if she develops severe hypertension and/or EKG changes.   The patient's BP 162/76, with history of HTN. She is uncooperative with cardiac monitoring and will not allow an EKG to be completed.   Imodium provided to counteract Dulcolax.   Patient care signed out to [employer], PA-C, to continue care.    Final Clinical Impressions(s) / ED Diagnoses   Final diagnoses:  None   1. Diarrhea 2. Laxative overdose (accidental)  ED Discharge Orders    None       Charlann Lange, PA-C 04/13/19 1610    Ezequiel Essex, MD 04/13/19 [phone]

## 2019-04-13 NOTE — ED Notes (Signed)
Pt on bedside commode unable to obtain vitals

## 2019-04-13 NOTE — ED Triage Notes (Signed)
[place] em[place] after taking unknown amount of laxatives (liquid). Pt unable to control bowels. Pt stated "I wanted to flush myself out" A&Ox4 and ambulatory   Hx of HTN

## 2019-04-13 NOTE — ED Notes (Signed)
Patient has unhooked herself from cardiac monitor.

## 2019-04-13 NOTE — ED Provider Notes (Signed)
Patient care assumed from [facility], PA-C at shift change pending EKG, repeat BMP, and reassessment.  See her note for full H&P.    Per her note, "Patient to ED with profuse, constant, nonbloody diarrhea since last evening (approximately 5-6 hours ago). She reports having dinner last night and feeling "full", or constipated. She took Dulcolax liquid, stating she drank 1/2 the bottle. About 1-2 hours later, she had not had a bowel movement and drank the other half of the bottle. Since that time, she has been experiencing abdominal cramping just before a BM, and multiple, ongoing stools. No nausea, vomiting, lightheadedness. ".   Physical Exam  BP (!) 158/87   Pulse 97   Temp 97.8 F (36.6 C) (Oral)   Resp 16   Ht 5\' 7"  (1.702 m)   Wt 90.7 kg   SpO2 97%   BMI 31.32 kg/m   Physical Exam Vitals signs and nursing note reviewed.  Constitutional:      General: She is not in acute distress.    Appearance: She is well-developed.     Comments: Pt sitting on commode in NAD.   HENT:     Head: Normocephalic and atraumatic.  Eyes:     Conjunctiva/sclera: Conjunctivae normal.  Neck:     Musculoskeletal: Neck supple.  Cardiovascular:     Rate and Rhythm: Normal rate.  Pulmonary:     Effort: Pulmonary effort is normal.  Musculoskeletal: Normal range of motion.  Skin:    General: Skin is warm and dry.  Neurological:     Mental Status: She is alert.     Comments: Clear speech. Moves all extremities.     ED Course/Procedures     Procedures Results for orders placed or performed during the hospital encounter of 04/13/19  C difficile quick scan w PCR reflex   Specimen: STOOL  Result Value Ref Range   C Diff antigen NEGATIVE NEGATIVE   C Diff toxin NEGATIVE NEGATIVE   C Diff interpretation No C. difficile detected.   CBC with Differential/Platelet  Result Value Ref Range   WBC 7.2 4.0 - 10.5 K/uL   RBC 3.72 (L) 3.87 - 5.11 MIL/uL   Hemoglobin 11.2 (L) 12.0 - 15.0 g/dL   HCT  36.7 36.0 - 46.0 %   MCV 98.7 80.0 - 100.0 fL   MCH 30.1 26.0 - 34.0 pg   MCHC 30.5 30.0 - 36.0 g/dL   RDW 12.3 11.5 - 15.5 %   Platelets 250 150 - 400 K/uL   nRBC 0.0 0.0 - 0.2 %   Neutrophils Relative % 60 %   Neutro Abs 4.3 1.7 - 7.7 K/uL   Lymphocytes Relative 27 %   Lymphs Abs 2.0 0.7 - 4.0 K/uL   Monocytes Relative 12 %   Monocytes Absolute 0.8 0.1 - 1.0 K/uL   Eosinophils Relative 1 %   Eosinophils Absolute 0.1 0.0 - 0.5 K/uL   Basophils Relative 0 %   Basophils Absolute 0.0 0.0 - 0.1 K/uL   Immature Granulocytes 0 %   Abs Immature Granulocytes 0.01 0.00 - 0.07 K/uL  Comprehensive metabolic panel  Result Value Ref Range   Sodium 144 135 - 145 mmol/L   Potassium 3.4 (L) 3.5 - 5.1 mmol/L   Chloride 103 98 - 111 mmol/L   CO2 32 22 - 32 mmol/L   Glucose, Bld 129 (H) 70 - 99 mg/dL   BUN 17 6 - 20 mg/dL   Creatinine, Ser 0.81 0.44 - 1.00 mg/dL  Calcium 9.5 8.9 - 10.3 mg/dL   Total Protein 8.0 6.5 - 8.1 g/dL   Albumin 4.9 3.5 - 5.0 g/dL   AST 27 15 - 41 U/L   ALT 26 0 - 44 U/L   Alkaline Phosphatase 83 38 - 126 U/L   Total Bilirubin 0.4 0.3 - 1.2 mg/dL   GFR calc non Af Amer >60 >60 mL/min   GFR calc Af Amer >60 >60 mL/min   Anion gap 9 5 - 15  Lipase, blood  Result Value Ref Range   Lipase 26 11 - 51 U/L  Comprehensive metabolic panel  Result Value Ref Range   Sodium 141 135 - 145 mmol/L   Potassium 3.4 (L) 3.5 - 5.1 mmol/L   Chloride 106 98 - 111 mmol/L   CO2 28 22 - 32 mmol/L   Glucose, Bld 106 (H) 70 - 99 mg/dL   BUN 14 6 - 20 mg/dL   Creatinine, Ser 0.62 0.44 - 1.00 mg/dL   Calcium 8.7 (L) 8.9 - 10.3 mg/dL   Total Protein 7.4 6.5 - 8.1 g/dL   Albumin 4.3 3.5 - 5.0 g/dL   AST 24 15 - 41 U/L   ALT 22 0 - 44 U/L   Alkaline Phosphatase 77 38 - 126 U/L   Total Bilirubin 0.4 0.3 - 1.2 mg/dL   GFR calc non Af Amer >60 >60 mL/min   GFR calc Af Amer >60 >60 mL/min   Anion gap 7 5 - 15   No results found.  Initial EKG:  NSR, HR 53 QTc 416. No ischemic  changes  Repeat EKG: NSR HR 68.  QTc 435.  No significant change from prior.   MDM   Briefly, 53 year old female presenting for evaluation of diarrhea.  Last night around 7-8:00 she drank half of bottle of Dulcolax.  About an hour later she drank the rest of the bottle.  Today she presents complaining of profuse diarrhea.  Per prior provider's note,  "Review of Dulcolax ingestion with pharmacy who identifies that the patient received a total of: 118 mg potasium  14,042 mg magnesium 472 mg calcium 71 mg sodium  Per Poison Control:  Provide IVF's to rehydrate; Bmet, cardiac monitoring, repeat Bmet in 6 hours. ICU admission if she develops severe hypertension and/or EKG changes."  Initial vital signs are reassuring, did develop HTN later in visit which she has a h/o.  On my eval patient in no acute distress.  Nontoxic nonseptic appearing.  Reviewed labs,  CBC notable for mild anemia.  No leukocytosis. CMP notable for mild hypokalemia, with potassium of 3.4.  Otherwise electrolytes are normal.  Normal kidney and liver function. Repeat CMP with potassium.  No other gross electrolyte derangements.  Normal kidney and liver function. Lipase negative. C. difficile screening is negative GI panel in process  Multiple EKGs obtained and both are benign.  No prolonged QTC, arrhythmia or ischemic changes.  Pt provided with IVF hydration and imodium (after discussion with pharmacy).   11:10 AM discussed case with Larkin Ina from pharmacy.  He states that it is likely safe to give patients regularly scheduled medications for her history of hypertension.  Patient has been reevaluated multiple times.  She has remained without acute distress throughout her ED stay.  She is mildly hypertensive, but she does have a history of this, no severe hypertension or EKG changes that would require admission.  Have discussed the plan for discharge with PCP follow-up.  Advised on return precautions.  She voiced  understanding and is in agreement with plan.  All questions answered.  Patient stable for discharge.     Rodney Booze, PA-C 04/13/19 1411    Fredia Sorrow, MD 04/15/19 1531

## 2019-04-13 NOTE — ED Notes (Signed)
Poison Control has closed patient case. Patient is cleared.

## 2019-04-13 NOTE — ED Notes (Signed)
Patient has unhooked herself from Cardiac monitor.

## 2019-04-13 NOTE — ED Notes (Signed)
While trying to obtain vitals, pt would not sit still and stated "so are they gonna do scans or something because they are taking forever around here"

## 2019-04-13 NOTE — Discharge Instructions (Addendum)
Please follow up with your primary doctor within the next 5-7 days.  If you do not have a primary care provider, information for a healthcare clinic has been provided for you to make arrangements for follow up care. Please return to the ER sooner if you have any new or worsening symptoms, or if you have any of the following symptoms:  Persistent diarrhea Abdominal pain that does not go away.  You have a fever.  You keep throwing up (vomiting).  The pain is felt only in portions of the abdomen. Pain in the right side could possibly be appendicitis. In an adult, pain in the left lower portion of the abdomen could be colitis or diverticulitis.  You pass bloody or black tarry stools.  There is bright red blood in the stool.  The constipation stays for more than 4 days.  There is belly (abdominal) or rectal pain.  You do not seem to be getting better.  You have any questions or concerns.

## 2019-04-14 LAB — GASTROINTESTINAL PANEL BY PCR, STOOL (REPLACES STOOL CULTURE)

## 2019-05-03 ENCOUNTER — Emergency Department (HOSPITAL_COMMUNITY): Payer: Medicare Other

## 2019-05-03 ENCOUNTER — Other Ambulatory Visit: Payer: Self-pay

## 2019-05-03 ENCOUNTER — Emergency Department (HOSPITAL_COMMUNITY)
Admission: EM | Admit: 2019-05-03 | Discharge: 2019-05-03 | Disposition: A | Discharge status: Home / Self Care | Payer: Medicare Other | Attending: Emergency Medicine | Admitting: Emergency Medicine

## 2019-05-03 ENCOUNTER — Encounter (HOSPITAL_COMMUNITY): Payer: Self-pay

## 2019-05-03 DIAGNOSIS — Z888 Allergy status to other drugs, medicaments and biological substances status: Secondary | ICD-10-CM | POA: Diagnosis not present

## 2019-05-03 DIAGNOSIS — Z885 Allergy status to narcotic agent status: Secondary | ICD-10-CM | POA: Insufficient documentation

## 2019-05-03 DIAGNOSIS — Z79899 Other long term (current) drug therapy: Secondary | ICD-10-CM | POA: Insufficient documentation

## 2019-05-03 DIAGNOSIS — I10 Essential (primary) hypertension: Secondary | ICD-10-CM | POA: Insufficient documentation

## 2019-05-03 DIAGNOSIS — K5732 Diverticulitis of large intestine without perforation or abscess without bleeding: Secondary | ICD-10-CM | POA: Insufficient documentation

## 2019-05-03 DIAGNOSIS — K5792 Diverticulitis of intestine, part unspecified, without perforation or abscess without bleeding: Secondary | ICD-10-CM

## 2019-05-03 DIAGNOSIS — R1084 Generalized abdominal pain: Secondary | ICD-10-CM | POA: Diagnosis present

## 2019-05-03 LAB — CBC
HCT: 37.6 % (ref 36.0–46.0)
Hemoglobin: 12 g/dL (ref 12.0–15.0)
MCH: 30.7 pg (ref 26.0–34.0)
MCHC: 31.9 g/dL (ref 30.0–36.0)
MCV: 96.2 fL (ref 80.0–100.0)
Platelets: 259 10*3/uL (ref 150–400)
RBC: 3.91 MIL/uL (ref 3.87–5.11)
RDW: 12.4 % (ref 11.5–15.5)
WBC: 9.6 10*3/uL (ref 4.0–10.5)
nRBC: 0 % (ref 0.0–0.2)

## 2019-05-03 LAB — COMPREHENSIVE METABOLIC PANEL
ALT: 15 U/L (ref 0–44)
AST: 18 U/L (ref 15–41)
Albumin: 4.6 g/dL (ref 3.5–5.0)
Alkaline Phosphatase: 76 U/L (ref 38–126)
Anion gap: 9 (ref 5–15)
BUN: 20 mg/dL (ref 6–20)
CO2: 26 mmol/L (ref 22–32)
Calcium: 9.4 mg/dL (ref 8.9–10.3)
Chloride: 102 mmol/L (ref 98–111)
Creatinine, Ser: 0.77 mg/dL (ref 0.44–1.00)
GFR calc Af Amer: >60 mL/min (ref >60)
GFR calc non Af Amer: >60 mL/min (ref >60)
Glucose, Bld: 120 mg/dL — ABNORMAL HIGH (ref 70–99)
Potassium: 3.5 mmol/L (ref 3.5–5.1)
Sodium: 137 mmol/L (ref 135–145)
Total Bilirubin: 1 mg/dL (ref 0.3–1.2)
Total Protein: 8.3 g/dL — ABNORMAL HIGH (ref 6.5–8.1)

## 2019-05-03 LAB — I-STAT BETA HCG BLOOD, ED (MC, WL, AP ONLY): I-stat hCG, quantitative: <5 m[IU]/mL (ref <5)

## 2019-05-03 LAB — LIPASE, BLOOD: Lipase: 23 U/L (ref 11–51)

## 2019-05-03 LAB — URINALYSIS, ROUTINE W REFLEX MICROSCOPIC
Bilirubin Urine: NEGATIVE
Glucose, UA: NEGATIVE mg/dL
Hgb urine dipstick: NEGATIVE
Ketones, ur: NEGATIVE mg/dL
Leukocytes,Ua: NEGATIVE
Nitrite: NEGATIVE
Protein, ur: NEGATIVE mg/dL
Specific Gravity, Urine: 1.025 (ref 1.005–1.030)
pH: 5 (ref 5.0–8.0)

## 2019-05-03 LAB — TROPONIN I (HIGH SENSITIVITY): Troponin I (High Sensitivity): 4 ng/L (ref <18)

## 2019-05-03 MED ORDER — LOPERAMIDE HCL 2 MG PO CAPS
2.0000 mg | ORAL_CAPSULE | Freq: Once | ORAL | Status: AC
  Administered 2019-05-03: 2 mg via ORAL
  Filled 2019-05-03: qty 1

## 2019-05-03 MED ORDER — AMOXICILLIN-POT CLAVULANATE 875-125 MG PO TABS
1.0000 | ORAL_TABLET | Freq: Once | ORAL | Status: AC
  Administered 2019-05-03: 1 via ORAL
  Filled 2019-05-03: qty 1

## 2019-05-03 MED ORDER — ONDANSETRON HCL 4 MG/2ML IJ SOLN
4.0000 mg | Freq: Once | INTRAMUSCULAR | Status: AC
  Administered 2019-05-03: 4 mg via INTRAVENOUS
  Filled 2019-05-03: qty 2

## 2019-05-03 MED ORDER — SODIUM CHLORIDE 0.9% FLUSH: 3.0000 mL | Freq: Once | INTRAVENOUS | Status: DC

## 2019-05-03 MED ORDER — SODIUM CHLORIDE (PF) 0.9 % IJ SOLN
INTRAMUSCULAR | Status: AC
  Filled 2019-05-03: qty 50

## 2019-05-03 MED ORDER — FENTANYL CITRATE (PF) 100 MCG/2ML IJ SOLN
50.0000 ug | Freq: Once | INTRAMUSCULAR | Status: AC
  Administered 2019-05-03: 50 ug via INTRAVENOUS
  Filled 2019-05-03: qty 2

## 2019-05-03 MED ORDER — IOHEXOL 300 MG/ML  SOLN
100.0000 mL | Freq: Once | INTRAMUSCULAR | Status: AC | PRN
  Administered 2019-05-03: 100 mL via INTRAVENOUS

## 2019-05-03 MED ORDER — AMOXICILLIN-POT CLAVULANATE 875-125 MG PO TABS: 1.0000 | ORAL_TABLET | Freq: Two times a day (BID) | ORAL | 0 refills | Status: DC

## 2019-05-03 NOTE — ED Triage Notes (Signed)
[facility]. Pt c/o diarrhea and abd cramps x 1 hour.   Pt ambulatory to restroom.

## 2019-05-03 NOTE — Discharge Instructions (Signed)
Please take the antibiotic as prescribed for suspected diverticulitis.  If you develop worsening pain, vomiting or other new concerns recommend return to the emergency room for reassessment.  Otherwise, recommend following up with your primary doctor for recheck next week.

## 2019-05-03 NOTE — ED Provider Notes (Signed)
[facility] DEPT Provider Note   CSN: [id number] Arrival date & time: 05/03/19  1403     History   Chief Complaint Chief Complaint  Patient presents with  . Abdominal Pain    HPI Andrea Bryan is a 53 y.o. female. Past medical history presented  Chief complaint abdominal pain, diarrhea.  Patient reported since she was seen here earlier in July that her symptoms resolved.  However today, patient started having severe abdominal pain.  Describes that is unrelenting cramping pain, pain throughout her abdomen, states very tender to palpation.  No alleviating or aggravating factors.  Has not taken any medication for the pain.  Came near immediately after the pain started to our emergency department.  Patient states that yesterday she had a large bowel movement, and has since had multiple small loose bowel movements.  No blood.  Patient denies any significant nausea, vomiting.  No fever, sick contacts.  States that she recently moved from [place].  Additional history obtained from chart review, recent visit in April 13, 2019 in our emergency department.     HPI  Past Medical History:  Diagnosis Date  . Hypertension     There are no active problems to display for this patient.   History reviewed. No pertinent surgical history.   OB History   No obstetric history on file.      Home Medications    Prior to Admission medications   Medication Sig Start Date End Date Taking? Authorizing Provider  atenolol (TENORMIN) 100 MG tablet Take 100 mg by mouth daily. 09/19/16   [provider]  cyclobenzaprine (FLEXERIL) 10 MG tablet Take 10 mg by mouth 3 (three) times daily as needed for muscle spasms.  09/29/16   [provider]  fluticasone (FLONASE) 50 MCG/ACT nasal spray Place 1 spray into both nostrils daily as needed for allergies.  09/19/16   [provider]  levothyroxine (SYNTHROID) 75 MCG tablet Take 75 mcg by mouth  daily before breakfast. 09/19/16   [provider]  lisinopril (ZESTRIL) 40 MG tablet Take 20 mg by mouth daily. 09/19/16   [provider]  loratadine (CLARITIN) 10 MG tablet Take 10 mg by mouth daily as needed for allergies.     [provider]  omeprazole (PRILOSEC) 20 MG capsule Take 20 mg by mouth daily. 02/19/18   [provider]  oxybutynin (DITROPAN-XL) 5 MG 24 hr tablet Take 5 mg by mouth daily. 09/19/16   [provider]  simvastatin (ZOCOR) 20 MG tablet Take 20 mg by mouth daily. 09/19/16   [provider]    Family History No family history on file.  Social History Social History   Tobacco Use  . Smoking status: Not on file  Substance Use Topics  . Alcohol use: Not on file  . Drug use: Not on file     Allergies   Metformin, Atorvastatin, Oxycodone, and Tramadol   Review of Systems Review of Systems  Constitutional: Negative for chills and fever.  HENT: Negative for ear pain and sore throat.   Eyes: Negative for pain and visual disturbance.  Respiratory: Negative for cough and shortness of breath.   Cardiovascular: Negative for chest pain and palpitations.  Gastrointestinal: Positive for abdominal pain and diarrhea. Negative for vomiting.  Genitourinary: Negative for dysuria and hematuria.  Musculoskeletal: Negative for arthralgias and back pain.  Skin: Negative for color change and rash.  Neurological: Negative for seizures and syncope.  All other systems reviewed  and are negative.    Physical Exam Updated Vital Signs BP (!) 217/105   Pulse 77   Temp 98.9 F (37.2 C) (Oral)   Resp 16   Wt 90 kg   SpO2 100%   BMI 31.08 kg/m   Physical Exam Vitals signs and nursing note reviewed.  Constitutional:      General: She is not in acute distress.    Appearance: She is well-developed.  HENT:     Head: Normocephalic and atraumatic.  Eyes:     Conjunctiva/sclera: Conjunctivae normal.  Neck:      Musculoskeletal: Neck supple.  Cardiovascular:     Rate and Rhythm: Normal rate and regular rhythm.     Heart sounds: No murmur.  Pulmonary:     Effort: Pulmonary effort is normal. No respiratory distress.     Breath sounds: Normal breath sounds.  Abdominal:     Palpations: Abdomen is soft.     Comments: Generalized tenderness palpation, no rebound or guarding  Skin:    General: Skin is warm and dry.  Neurological:     Mental Status: She is alert.      ED Treatments / Results  Labs (all labs ordered are listed, but only abnormal results are displayed) Labs Reviewed  COMPREHENSIVE METABOLIC PANEL - Abnormal; Notable for the following components:      Result Value   Glucose, Bld 120 (*)    Total Protein 8.3 (*)    All other components within normal limits  LIPASE, BLOOD  CBC  URINALYSIS, ROUTINE W REFLEX MICROSCOPIC  I-STAT BETA HCG BLOOD, ED ([facility], [facility], [facility] ONLY)  TROPONIN I (HIGH SENSITIVITY)    EKG None  Radiology No results found.  Procedures Procedures (including critical care time)  Medications Ordered in ED Medications  sodium chloride flush (NS) 0.9 % injection 3 mL (has no administration in time range)  fentaNYL (SUBLIMAZE) injection 50 mcg (has no administration in time range)  ondansetron (ZOFRAN) injection 4 mg (has no administration in time range)  loperamide (IMODIUM) capsule 2 mg (has no administration in time range)     Initial Impression / Assessment and Plan / ED Course  I have reviewed the triage vital signs and the nursing notes.  Pertinent labs & imaging results that were available during my care of the patient were reviewed by me and considered in my medical decision making (see chart for details).  Clinical Course as of May 02 2245  Sat May 03, 2019  1953 Completed chart review, will go assess patient   [RD]  2113 CT ABDOMEN PELVIS W CONTRAST [RD]  2246 Udpated patient on results   [RD]    Clinical Course User Index [RD] Lucrezia Starch, MD       53 year old lady presents to the emergency department with a chief complaint of abdominal pain, loose stools.  Abdomen soft but endorses tenderness throughout belly.  Labs unremarkable.  CT scan concerning for early acute diverticulitis.  Will start on antibiotics, gave first dose of Augmentin here in the emergency department.  Patient well-appearing, stable vital signs, tolerating p.o., fully appropriate for outpatient management at this time.    After the discussed management above, the patient was determined to be safe for discharge.  The patient was in agreement with this plan and all questions regarding their care were answered.  ED return precautions were discussed and the patient will return to the ED with any significant worsening of condition.    Final Clinical Impressions(s) /  ED Diagnoses   Final diagnoses:  Diverticulitis    ED Discharge Orders    None       Lucrezia Starch, MD 05/03/19 2247

## 2019-05-07 ENCOUNTER — Other Ambulatory Visit: Payer: Self-pay

## 2019-05-07 ENCOUNTER — Encounter (HOSPITAL_COMMUNITY): Payer: Self-pay | Admitting: Emergency Medicine

## 2019-05-07 DIAGNOSIS — Z7901 Long term (current) use of anticoagulants: Secondary | ICD-10-CM | POA: Diagnosis not present

## 2019-05-07 DIAGNOSIS — L292 Pruritus vulvae: Secondary | ICD-10-CM | POA: Diagnosis present

## 2019-05-07 DIAGNOSIS — Z79899 Other long term (current) drug therapy: Secondary | ICD-10-CM | POA: Insufficient documentation

## 2019-05-07 DIAGNOSIS — N950 Postmenopausal bleeding: Secondary | ICD-10-CM | POA: Diagnosis not present

## 2019-05-07 DIAGNOSIS — I10 Essential (primary) hypertension: Secondary | ICD-10-CM | POA: Insufficient documentation

## 2019-05-07 MED ORDER — SODIUM CHLORIDE 0.9% FLUSH: 3.0000 mL | Freq: Once | INTRAVENOUS | Status: DC

## 2019-05-07 NOTE — ED Triage Notes (Signed)
Patient is complaining of abdominal pain and vagina pain/itching. Patient states this started yesterday.

## 2019-05-08 ENCOUNTER — Emergency Department (HOSPITAL_COMMUNITY): Payer: Medicare Other

## 2019-05-08 ENCOUNTER — Emergency Department (HOSPITAL_COMMUNITY)
Admission: EM | Admit: 2019-05-08 | Discharge: 2019-05-08 | Disposition: A | Discharge status: Home / Self Care | Payer: Medicare Other | Attending: Emergency Medicine | Admitting: Emergency Medicine

## 2019-05-08 DIAGNOSIS — N898 Other specified noninflammatory disorders of vagina: Secondary | ICD-10-CM

## 2019-05-08 DIAGNOSIS — N950 Postmenopausal bleeding: Secondary | ICD-10-CM

## 2019-05-08 LAB — WET PREP, GENITAL
Clue Cells Wet Prep HPF POC: NONE SEEN
Sperm: NONE SEEN
Trich, Wet Prep: NONE SEEN
Yeast Wet Prep HPF POC: NONE SEEN

## 2019-05-08 LAB — COMPREHENSIVE METABOLIC PANEL
ALT: 15 U/L (ref 0–44)
AST: 18 U/L (ref 15–41)
Albumin: 4.2 g/dL (ref 3.5–5.0)
Alkaline Phosphatase: 76 U/L (ref 38–126)
Anion gap: 8 (ref 5–15)
BUN: 18 mg/dL (ref 6–20)
CO2: 28 mmol/L (ref 22–32)
Calcium: 9.4 mg/dL (ref 8.9–10.3)
Chloride: 106 mmol/L (ref 98–111)
Creatinine, Ser: 0.8 mg/dL (ref 0.44–1.00)
GFR calc Af Amer: >60 mL/min (ref >60)
GFR calc non Af Amer: >60 mL/min (ref >60)
Glucose, Bld: 118 mg/dL — ABNORMAL HIGH (ref 70–99)
Potassium: 4 mmol/L (ref 3.5–5.1)
Sodium: 142 mmol/L (ref 135–145)
Total Bilirubin: 0.5 mg/dL (ref 0.3–1.2)
Total Protein: 7.4 g/dL (ref 6.5–8.1)

## 2019-05-08 LAB — CBC
HCT: 37.8 % (ref 36.0–46.0)
Hemoglobin: 11.8 g/dL — ABNORMAL LOW (ref 12.0–15.0)
MCH: 30.3 pg (ref 26.0–34.0)
MCHC: 31.2 g/dL (ref 30.0–36.0)
MCV: 97.2 fL (ref 80.0–100.0)
Platelets: 251 10*3/uL (ref 150–400)
RBC: 3.89 MIL/uL (ref 3.87–5.11)
RDW: 12.4 % (ref 11.5–15.5)
WBC: 7.8 10*3/uL (ref 4.0–10.5)
nRBC: 0 % (ref 0.0–0.2)

## 2019-05-08 LAB — LIPASE, BLOOD: Lipase: 30 U/L (ref 11–51)

## 2019-05-08 MED ORDER — TRIAMCINOLONE ACETONIDE 0.1 % EX CREA: 1.0000 "application " | TOPICAL_CREAM | Freq: Two times a day (BID) | CUTANEOUS | 0 refills | Status: DC

## 2019-05-08 MED ORDER — LORAZEPAM 1 MG PO TABS
1.0000 mg | ORAL_TABLET | Freq: Once | ORAL | Status: AC
  Administered 2019-05-08: 1 mg via ORAL
  Filled 2019-05-08: qty 1

## 2019-05-08 MED ORDER — ACETAMINOPHEN 325 MG PO TABS
650.0000 mg | ORAL_TABLET | Freq: Once | ORAL | Status: AC
  Administered 2019-05-08: 03:00:00 650 mg via ORAL
  Filled 2019-05-08: qty 2

## 2019-05-08 NOTE — BH Assessment (Signed)
Clinician attempted to make contact w/ pt's provider to obtain collateral prior to completing assessment but was unable to do so; will attempt again at a later time.

## 2019-05-08 NOTE — ED Notes (Signed)
Patient not able to provide urine at this time

## 2019-05-08 NOTE — Discharge Instructions (Signed)
Thank you for allowing me to care for you today in the Emergency Department.   Please call the number above to schedule follow-up appointment with OB/GYN since you have been having some bleeding.  Apply triamcinolone cream to the skin to places that are itching.  Do not apply the cream to the inside of the vagina.  Return to the emergency department if you develop new or worsening symptoms including severe abdominal pain, high fevers, persistent vomiting, or other new, concerning symptoms.

## 2019-05-08 NOTE — ED Provider Notes (Signed)
[place] DEPT Provider Note   CSN: [id number] Arrival date & time: 05/07/19  2135     History   Chief Complaint Chief Complaint  Patient presents with  . Abdominal Pain  . Vaginal Itching    HPI Andrea Bryan is a 53 y.o. female with a history of hypertension and diverticulitis who presents to the emergency department with a chief complaint of vaginal itching.  Patient reports vaginal itching that began yesterday.  She denies vaginal discharge, but states that she has noted some vaginal bleeding over the last few days.  She reports that she is postmenopausal and has not had a menstrual cycle for the last few years.  No treatment prior to arrival.  She denies fever, chills, nausea, vomiting, diarrhea, constipation, dysuria, hematuria, cough, shortness of breath.  She is not established with an OB/GYN.  She also reports that she has been having some pain in the middle of her back.  No known trauma or injury.  No numbness or weakness.     The history is provided by the patient. No language interpreter was used.    Past Medical History:  Diagnosis Date  . Hypertension     There are no active problems to display for this patient.   History reviewed. No pertinent surgical history.   OB History   No obstetric history on file.      Home Medications    Prior to Admission medications   Medication Sig Start Date End Date Taking? Authorizing Provider  amoxicillin-clavulanate (AUGMENTIN) 875-125 MG tablet Take 1 tablet by mouth every 12 (twelve) hours. 05/03/19  Yes Lucrezia Starch, MD  atenolol (TENORMIN) 100 MG tablet Take 100 mg by mouth daily. 09/19/16  Yes [provider]  cyclobenzaprine (FLEXERIL) 10 MG tablet Take 10 mg by mouth 3 (three) times daily as needed for muscle spasms.  09/29/16  Yes [provider]  ELIQUIS 5 MG TABS tablet Take 5 mg by mouth 2 (two) times daily. 03/03/19  Yes [provider]   fluticasone (FLONASE) 50 MCG/ACT nasal spray Place 1 spray into both nostrils daily as needed for allergies.  09/19/16  Yes [provider]  levothyroxine (SYNTHROID) 75 MCG tablet Take 75 mcg by mouth daily before breakfast. 09/19/16  Yes [provider]  lisinopril (ZESTRIL) 40 MG tablet Take 20 mg by mouth daily. 09/19/16  Yes [provider]  loratadine (CLARITIN) 10 MG tablet Take 10 mg by mouth daily as needed for allergies.    Yes [provider]  omeprazole (PRILOSEC) 20 MG capsule Take 20 mg by mouth daily. 02/19/18  Yes [provider]  oxybutynin (DITROPAN-XL) 5 MG 24 hr tablet Take 5 mg by mouth daily. 09/19/16  Yes [provider]  simvastatin (ZOCOR) 20 MG tablet Take 20 mg by mouth daily. 09/19/16  Yes [provider]  triamcinolone cream (KENALOG) 0.1 % Apply 1 application topically 2 (two) times daily. 05/08/19   Jacki Couse A, PA-C    Family History History reviewed. No pertinent family history.  Social History Social History   Tobacco Use  . Smoking status: Never Smoker  . Smokeless tobacco: Never Used  Substance Use Topics  . Alcohol use: Not Currently  . Drug use: Not Currently     Allergies   Metformin, Atorvastatin, Oxycodone, and Tramadol   Review of Systems Review of Systems  Constitutional: Negative for activity change, chills and fever.  Respiratory: Negative for shortness of breath.   Cardiovascular:  Negative for chest pain.  Gastrointestinal: Negative for abdominal pain, anal bleeding, blood in stool, diarrhea, nausea and vomiting.  Genitourinary: Positive for vaginal bleeding. Negative for dysuria, frequency, pelvic pain and vaginal pain.       Vaginal itching   Musculoskeletal: Negative for back pain, myalgias and neck pain.  Skin: Negative for rash.  Allergic/Immunologic: Negative for immunocompromised state.  Neurological: Negative for weakness, numbness and headaches.   Psychiatric/Behavioral: Negative for confusion.     Physical Exam Updated Vital Signs BP (!) 165/79   Pulse (!) 58   Temp 98.8 F (37.1 C) (Oral)   Resp 16   Ht 5\' 6"  (1.676 m)   Wt 94 kg   SpO2 99%   BMI 33.46 kg/m   Physical Exam Vitals signs and nursing note reviewed.  Constitutional:      General: She is not in acute distress.    Appearance: She is not ill-appearing, toxic-appearing or diaphoretic.  HENT:     Head: Normocephalic.  Eyes:     Conjunctiva/sclera: Conjunctivae normal.  Neck:     Musculoskeletal: Neck supple.  Cardiovascular:     Rate and Rhythm: Normal rate and regular rhythm.     Pulses: Normal pulses.     Heart sounds: Normal heart sounds. No murmur. No friction rub. No gallop.   Pulmonary:     Effort: Pulmonary effort is normal. No respiratory distress.     Breath sounds: No stridor. No wheezing, rhonchi or rales.  Chest:     Chest wall: No tenderness.  Abdominal:     General: There is no distension.     Palpations: Abdomen is soft. There is no mass.     Tenderness: There is no abdominal tenderness. There is no right CVA tenderness, left CVA tenderness, guarding or rebound.     Hernia: No hernia is present.  Genitourinary:    Comments: Chaperoned exam.  Cervix appeared somewhat friable, but no obvious bleeding.  Minimal yellowish-white discharge noted in the vaginal vault.  No cervical motion tenderness.  No adnexal tenderness bilaterally. Musculoskeletal:     Comments: Tender palpation to the midline thoracic spine without paraspinal muscle tenderness.  No crepitus or step-offs.  Cervical lumbar spine are unremarkable.  Skin:    General: Skin is warm.     Findings: No rash.  Neurological:     Mental Status: She is alert.  Psychiatric:        Mood and Affect: Affect is labile.        Behavior: Behavior normal.      ED Treatments / Results  Labs (all labs ordered are listed, but only abnormal results are displayed) Labs Reviewed  WET  PREP, GENITAL - Abnormal; Notable for the following components:      Result Value   WBC, Wet Prep HPF POC FEW (*)    All other components within normal limits  COMPREHENSIVE METABOLIC PANEL - Abnormal; Notable for the following components:   Glucose, Bld 118 (*)    All other components within normal limits  CBC - Abnormal; Notable for the following components:   Hemoglobin 11.8 (*)    All other components within normal limits  LIPASE, BLOOD  GC/CHLAMYDIA PROBE AMP (Rafael Gonzalez) NOT AT [facility]    EKG None  Radiology Dg Thoracic Spine 2 View  Result Date: 05/08/2019 CLINICAL DATA:  Upper back pain this morning, atraumatic EXAM: THORACIC SPINE 2 VIEWS COMPARISON:  None. FINDINGS: No fracture, subluxation, or endplate erosion. No evident bone lesion.  Upper thoracic dextrocurvature. There is bridging osteophytes involving lower thoracic vertebrae projecting towards the right. Partial visualization of a spinal catheter, tip at T8-9. IMPRESSION: 1. No acute finding. 2. Upper thoracic dextrocurvature. 3. Lower thoracic bridging osteophytes. Electronically Signed   By: Monte Fantasia M.D.   On: 05/08/2019 04:56   US Transvaginal Non-ob  Result Date: 05/08/2019 CLINICAL DATA:  Postmenopausal vaginal bleeding EXAM: TRANSABDOMINAL AND TRANSVAGINAL ULTRASOUND OF PELVIS TECHNIQUE: Both transabdominal and transvaginal ultrasound examinations of the pelvis were performed. Transabdominal technique was performed for global imaging of the pelvis including uterus, ovaries, adnexal regions, and pelvic cul-de-sac. It was necessary to proceed with endovaginal exam following the transabdominal exam to visualize the endometrium. COMPARISON:  Abdominal CT from 5 days ago FINDINGS: Uterus 7 cm length and 5 cm thickness on sagittal images. There are 2 pedunculated hypoechoic heterogeneous masses consistent with fibroids, measuring up to 5 cm. There is also an anterior intramural fibroid measuring 18 mm. Endometrium  Thickness: 3 mm.  No focal abnormality visualized. Right ovary Not visualized Left ovary Not visualized Other findings No abnormal free fluid. IMPRESSION: 1. Normal 3 mm endometrial stripe. If bleeding remains unresponsive to hormonal or medical therapy, sonohysterogram should be considered for focal lesion work-up. (Ref: Radiological Reasoning: Algorithmic Workup of Abnormal Vaginal Bleeding with Endovaginal Sonography and Sonohysterography. AJR 2008; 782:N56-21) 2. Fibroid uterus. 3. The ovaries were not visible but the adnexa were unremarkable on recent abdominal CT. Electronically Signed   By: Monte Fantasia M.D.   On: 05/08/2019 05:00   US Pelvis Complete  Result Date: 05/08/2019 CLINICAL DATA:  Postmenopausal vaginal bleeding EXAM: TRANSABDOMINAL AND TRANSVAGINAL ULTRASOUND OF PELVIS TECHNIQUE: Both transabdominal and transvaginal ultrasound examinations of the pelvis were performed. Transabdominal technique was performed for global imaging of the pelvis including uterus, ovaries, adnexal regions, and pelvic cul-de-sac. It was necessary to proceed with endovaginal exam following the transabdominal exam to visualize the endometrium. COMPARISON:  Abdominal CT from 5 days ago FINDINGS: Uterus 7 cm length and 5 cm thickness on sagittal images. There are 2 pedunculated hypoechoic heterogeneous masses consistent with fibroids, measuring up to 5 cm. There is also an anterior intramural fibroid measuring 18 mm. Endometrium Thickness: 3 mm.  No focal abnormality visualized. Right ovary Not visualized Left ovary Not visualized Other findings No abnormal free fluid. IMPRESSION: 1. Normal 3 mm endometrial stripe. If bleeding remains unresponsive to hormonal or medical therapy, sonohysterogram should be considered for focal lesion work-up. (Ref: Radiological Reasoning: Algorithmic Workup of Abnormal Vaginal Bleeding with Endovaginal Sonography and Sonohysterography. AJR 2008; 308:M57-84) 2. Fibroid uterus. 3. The  ovaries were not visible but the adnexa were unremarkable on recent abdominal CT. Electronically Signed   By: Monte Fantasia M.D.   On: 05/08/2019 05:00    Procedures Procedures (including critical care time)  Medications Ordered in ED Medications  acetaminophen (TYLENOL) tablet 650 mg (650 mg Oral Given 05/08/19 0303)  LORazepam (ATIVAN) tablet 1 mg (1 mg Oral Given 05/08/19 0510)     Initial Impression / Assessment and Plan / ED Course  I have reviewed the triage vital signs and the nursing notes.  Pertinent labs & imaging results that were available during my care of the patient were reviewed by me and considered in my medical decision making (see chart for details).  Clinical Course as of May 07 928  Thu May 08, 2019  0404 Notified by ultrasound tech that patient was talking to herself when he entered the room to perform pelvic ultrasound.   [  MM]    Clinical Course User Index [MM] Andrea Bryan A, PA-C       53 year old female with a history of hypertension and diverticulitis who presents to the emergency department with a chief complaint of vaginal itching for the last 2 days.  She has also had some vaginal bleeding for the last few days.  She has been postmenopausal for the last few years.  She also is requesting an x-ray of her mid back as she has been having some atraumatic back pain.  No constitutional symptoms, shortness of breath, cough, or chest pain.  No numbness or weakness.  She is hemodynamically stable on arrival.  Patient was recently treated for diverticulitis, but reports that all the symptoms have resolved.  On exam, cervix does appear somewhat friable, but no obvious bleeding, but I was notified by nursing staff that she began having some vaginal bleeding when she cleaned herself up after pelvic exam.  Given concern for postmenopausal vaginal bleeding, pelvic ultrasound was ordered, which demonstrated a normal 3 mm endometrial stripe.  If bleeding remains  unresponsive to hormonal or medical therapy then sonohysterogram should be considered for focal lesion work-up.  She has uterine fibroids.  Wet prep is unremarkable.  Gonorrhea and Chlamydia testing is pending, but I have low suspicion for positive results at this time.  Thoracic spine x-ray demonstrating osteophytes of the lower thoracic spine, which could be the source of her midline thoracic back pain.  After pelvic ultrasound was performed, technologist informed me that the patient was noted to be talking to herself when he entered the room.  Nursing staff also reports that the patient was observed to be having a conversation with herself in the room while she was sitting at the nurses station.  She heard the patient yelling at someone and threw an object across the room.  This then resolved.  Given concern for auditory visual hallucinations, TTS was consulted as the patient has no psychiatric history in her medical record.  Her labs in the ER today were all reassuring.  Prior to TTS assessment, patient was requesting discharge as she had called a cab.  I had a shared decision-making conversation with the patient and nursing staff.  She informs me that she is not having any suicidal ideation, homicidal ideation.  She denies any hallucinations that are instructing her to hurt herself or others.  The patient does not appear to be a danger to herself or others at this time.  I think it is reasonable to discharge the patient with outpatient follow-up.  She was given return precautions to the ER and OB/GYN referral.  She is hemodynamically stable and in no acute distress.  Safe for discharge home with outpatient follow-up.  Final Clinical Impressions(s) / ED Diagnoses   Final diagnoses:  Post-menopausal bleeding  Vaginal itching    ED Discharge Orders         Ordered    triamcinolone cream (KENALOG) 0.1 %  2 times daily     05/08/19 0551           Treazure Nery, Maree Erie A, PA-C 05/08/19 0940     Molpus, John, MD 05/08/19 2240

## 2019-05-09 LAB — GC/CHLAMYDIA PROBE AMP (~~LOC~~) NOT AT ARMC
Chlamydia: NEGATIVE
Neisseria Gonorrhea: NEGATIVE

## 2019-05-13 ENCOUNTER — Encounter (HOSPITAL_COMMUNITY): Payer: Self-pay | Admitting: Family Medicine

## 2019-05-13 ENCOUNTER — Emergency Department (HOSPITAL_COMMUNITY)
Admission: EM | Admit: 2019-05-13 | Discharge: 2019-05-13 | Disposition: A | Discharge status: Home / Self Care | Payer: Medicare Other | Attending: Emergency Medicine | Admitting: Emergency Medicine

## 2019-05-13 ENCOUNTER — Emergency Department (HOSPITAL_COMMUNITY): Payer: Medicare Other

## 2019-05-13 DIAGNOSIS — Z7901 Long term (current) use of anticoagulants: Secondary | ICD-10-CM | POA: Insufficient documentation

## 2019-05-13 DIAGNOSIS — D259 Leiomyoma of uterus, unspecified: Secondary | ICD-10-CM | POA: Insufficient documentation

## 2019-05-13 DIAGNOSIS — K5732 Diverticulitis of large intestine without perforation or abscess without bleeding: Secondary | ICD-10-CM | POA: Insufficient documentation

## 2019-05-13 DIAGNOSIS — I10 Essential (primary) hypertension: Secondary | ICD-10-CM | POA: Insufficient documentation

## 2019-05-13 DIAGNOSIS — Z79899 Other long term (current) drug therapy: Secondary | ICD-10-CM | POA: Diagnosis not present

## 2019-05-13 DIAGNOSIS — K5792 Diverticulitis of intestine, part unspecified, without perforation or abscess without bleeding: Secondary | ICD-10-CM

## 2019-05-13 DIAGNOSIS — R1032 Left lower quadrant pain: Secondary | ICD-10-CM | POA: Diagnosis present

## 2019-05-13 LAB — URINALYSIS, ROUTINE W REFLEX MICROSCOPIC
Bilirubin Urine: NEGATIVE
Glucose, UA: NEGATIVE mg/dL
Hgb urine dipstick: NEGATIVE
Ketones, ur: NEGATIVE mg/dL
Leukocytes,Ua: NEGATIVE
Nitrite: NEGATIVE
Protein, ur: NEGATIVE mg/dL
Specific Gravity, Urine: 1.028 (ref 1.005–1.030)
pH: 6 (ref 5.0–8.0)

## 2019-05-13 LAB — CBC
HCT: 39 % (ref 36.0–46.0)
Hemoglobin: 12.1 g/dL (ref 12.0–15.0)
MCH: 30.1 pg (ref 26.0–34.0)
MCHC: 31 g/dL (ref 30.0–36.0)
MCV: 97 fL (ref 80.0–100.0)
Platelets: 200 10*3/uL (ref 150–400)
RBC: 4.02 MIL/uL (ref 3.87–5.11)
RDW: 12.7 % (ref 11.5–15.5)
WBC: 8.4 10*3/uL (ref 4.0–10.5)
nRBC: 0 % (ref 0.0–0.2)

## 2019-05-13 LAB — COMPREHENSIVE METABOLIC PANEL
ALT: 15 U/L (ref 0–44)
AST: 24 U/L (ref 15–41)
Albumin: 4.6 g/dL (ref 3.5–5.0)
Alkaline Phosphatase: 71 U/L (ref 38–126)
Anion gap: 13 (ref 5–15)
BUN: 18 mg/dL (ref 6–20)
CO2: 25 mmol/L (ref 22–32)
Calcium: 9.6 mg/dL (ref 8.9–10.3)
Chloride: 105 mmol/L (ref 98–111)
Creatinine, Ser: 0.79 mg/dL (ref 0.44–1.00)
GFR calc Af Amer: >60 mL/min (ref >60)
GFR calc non Af Amer: >60 mL/min (ref >60)
Glucose, Bld: 101 mg/dL — ABNORMAL HIGH (ref 70–99)
Potassium: 3.9 mmol/L (ref 3.5–5.1)
Sodium: 143 mmol/L (ref 135–145)
Total Bilirubin: 0.6 mg/dL (ref 0.3–1.2)
Total Protein: 7.9 g/dL (ref 6.5–8.1)

## 2019-05-13 LAB — HCG, QUANTITATIVE, PREGNANCY: hCG, Beta Chain, Quant, S: 1 m[IU]/mL (ref <5)

## 2019-05-13 LAB — LIPASE, BLOOD: Lipase: 23 U/L (ref 11–51)

## 2019-05-13 MED ORDER — SODIUM CHLORIDE 0.9% FLUSH: 3.0000 mL | Freq: Once | INTRAVENOUS | Status: DC

## 2019-05-13 MED ORDER — IOHEXOL 300 MG/ML  SOLN
100.0000 mL | Freq: Once | INTRAMUSCULAR | Status: AC | PRN
  Administered 2019-05-13: 100 mL via INTRAVENOUS

## 2019-05-13 MED ORDER — SODIUM CHLORIDE (PF) 0.9 % IJ SOLN
INTRAMUSCULAR | Status: AC
  Filled 2019-05-13: qty 50

## 2019-05-13 NOTE — ED Notes (Signed)
Pt back from radiology 

## 2019-05-13 NOTE — ED Notes (Addendum)
Pt transported to radiology (CT)

## 2019-05-13 NOTE — ED Notes (Signed)
Urine and culture sent to lab  

## 2019-05-13 NOTE — ED Notes (Signed)
Below order not completed by EW. 

## 2019-05-13 NOTE — ED Notes (Addendum)
Pt ambulated to the bathroom without assistance. Gait steady  

## 2019-05-13 NOTE — ED Notes (Addendum)
Pt began to ambulate back to her room from the bathroom but then stopped halfway and turned back around stating "I think I can go now." Blood will be collected once pt returns to her room

## 2019-05-13 NOTE — ED Provider Notes (Signed)
[place] DEPT Provider Note   CSN: [id number] Arrival date & time: 05/13/19  1643     History   Chief Complaint Chief Complaint  Patient presents with  . Back Pain  . Abdominal Pain  . Vaginal Discharge    HPI Andrea Bryan is a 53 y.o. female.     HPI Pt presented to the ED with persistent abd pain.  The pain radiates to her back.  She denies any fever or vomiting.  Pt has also has been noticing some vaginal discharge.  She was seen in the ED on 8/6.  She was referred to [facility]. Pt has not had that appt yet.  Pt states the pain is persisting.   Does not feel better after the meds.  Came for eval Past Medical History:  Diagnosis Date  . Hypertension     There are no active problems to display for this patient.   History reviewed. No pertinent surgical history.   OB History   No obstetric history on file.      Home Medications    Prior to Admission medications   Medication Sig Start Date End Date Taking? Authorizing Provider  amoxicillin-clavulanate (AUGMENTIN) 875-125 MG tablet Take 1 tablet by mouth every 12 (twelve) hours. 05/03/19   Lucrezia Starch, MD  atenolol (TENORMIN) 100 MG tablet Take 100 mg by mouth daily. 09/19/16   [provider]  cyclobenzaprine (FLEXERIL) 10 MG tablet Take 10 mg by mouth 3 (three) times daily as needed for muscle spasms.  09/29/16   [provider]  ELIQUIS 5 MG TABS tablet Take 5 mg by mouth 2 (two) times daily. 03/03/19   [provider]  fluticasone (FLONASE) 50 MCG/ACT nasal spray Place 1 spray into both nostrils daily as needed for allergies.  09/19/16   [provider]  levothyroxine (SYNTHROID) 75 MCG tablet Take 75 mcg by mouth daily before breakfast. 09/19/16   [provider]  lisinopril (ZESTRIL) 40 MG tablet Take 20 mg by mouth daily. 09/19/16   [provider]  loratadine (CLARITIN) 10 MG tablet Take 10 mg by mouth daily as needed for  allergies.     [provider]  omeprazole (PRILOSEC) 20 MG capsule Take 20 mg by mouth daily. 02/19/18   [provider]  oxybutynin (DITROPAN-XL) 5 MG 24 hr tablet Take 5 mg by mouth daily. 09/19/16   [provider]  simvastatin (ZOCOR) 20 MG tablet Take 20 mg by mouth daily. 09/19/16   [provider]  triamcinolone cream (KENALOG) 0.1 % Apply 1 application topically 2 (two) times daily. 05/08/19   McDonald, Mia A, PA-C    Family History History reviewed. No pertinent family history.  Social History Social History   Tobacco Use  . Smoking status: Never Smoker  . Smokeless tobacco: Never Used  Substance Use Topics  . Alcohol use: Not Currently  . Drug use: Not Currently     Allergies   Metformin, Atorvastatin, Oxycodone, and Tramadol   Review of Systems Review of Systems  Constitutional: Negative for fever.  Cardiovascular: Negative for chest pain.  Genitourinary: Positive for vaginal bleeding. Negative for dysuria.  All other systems reviewed and are negative.    Physical Exam Updated Vital Signs BP (!) 152/128 (BP Location: Right Arm) Comment (BP Location): Lower  Pulse 79   Temp 99 F (37.2 C) (Oral)   Resp 18   Ht 1.676 m (5\' 6" )   Wt 98.4 kg  SpO2 99%   BMI 35.02 kg/m   Physical Exam Vitals signs and nursing note reviewed.  Constitutional:      General: She is not in acute distress.    Appearance: She is well-developed.  HENT:     Head: Normocephalic and atraumatic.     Right Ear: External ear normal.     Left Ear: External ear normal.  Eyes:     General: No scleral icterus.       Right eye: No discharge.        Left eye: No discharge.     Conjunctiva/sclera: Conjunctivae normal.  Neck:     Musculoskeletal: Neck supple.     Trachea: No tracheal deviation.  Cardiovascular:     Rate and Rhythm: Normal rate and regular rhythm.  Pulmonary:     Effort: Pulmonary effort is normal. No respiratory distress.      Breath sounds: Normal breath sounds. No stridor. No wheezing or rales.  Abdominal:     General: Bowel sounds are normal. There is no distension.     Palpations: Abdomen is soft.     Tenderness: There is abdominal tenderness in the suprapubic area and left lower quadrant. There is no guarding or rebound.  Musculoskeletal:        General: No tenderness.  Skin:    General: Skin is warm and dry.     Findings: No rash.  Neurological:     Mental Status: She is alert.     Cranial Nerves: No cranial nerve deficit (no facial droop, extraocular movements intact, no slurred speech).     Sensory: No sensory deficit.     Motor: No abnormal muscle tone or seizure activity.     Coordination: Coordination normal.  Psychiatric:        Mood and Affect: Mood normal.     Comments: ?auditory hallucinations      ED Treatments / Results  Labs (all labs ordered are listed, but only abnormal results are displayed) Labs Reviewed  COMPREHENSIVE METABOLIC PANEL - Abnormal; Notable for the following components:      Result Value   Glucose, Bld 101 (*)    All other components within normal limits  URINALYSIS, ROUTINE W REFLEX MICROSCOPIC - Abnormal; Notable for the following components:   APPearance HAZY (*)    All other components within normal limits  LIPASE, BLOOD  CBC  HCG, QUANTITATIVE, PREGNANCY    EKG None  Radiology Ct Abdomen Pelvis W Contrast  Result Date: 05/13/2019 CLINICAL DATA:  53 year old female with lower abdominal pain and vaginal bleeding. EXAM: CT ABDOMEN AND PELVIS WITH CONTRAST TECHNIQUE: Multidetector CT imaging of the abdomen and pelvis was performed using the standard protocol following bolus administration of intravenous contrast. CONTRAST:  120mL OMNIPAQUE IOHEXOL 300 MG/ML  SOLN COMPARISON:  CT of the abdomen pelvis dated 05/03/2019 FINDINGS: Lower chest: The visualized lung bases are clear. No intra-abdominal free air or free fluid. Hepatobiliary: Probable mild fatty  infiltration of the liver. No intrahepatic biliary ductal dilatation. Cholecystectomy. No retained calcified stone noted in the central CBD. Pancreas: Unremarkable. No pancreatic ductal dilatation or surrounding inflammatory changes. Spleen: Normal in size without focal abnormality. Adrenals/Urinary Tract: The adrenal glands are unremarkable. There is no hydronephrosis on either side. There is symmetric enhancement and excretion of contrast by both kidneys. The visualized ureters and urinary bladder appear unremarkable. Stomach/Bowel: There is extensive sigmoid diverticulosis with scattered colonic diverticula. No definite active inflammatory changes. Minimal stranding adjacent sigmoid colon, likely chronic and scarring.  Mild acute colitis is less likely but not excluded. Clinical correlation is recommended. There is moderate stool throughout the colon. There is no bowel obstruction or active inflammation. The appendix is normal. Vascular/Lymphatic: Mild aortoiliac atherosclerotic disease. The IVC is unremarkable. No portal venous gas. There is no adenopathy. Reproductive: Myomatous uterus with exophytic fundal fibroid. The ovaries are grossly unremarkable as visualized. Other: Spinal stimulator wire with tip at the level of T8-T9 disc space within the central canal. There is a 6.7 x 3.5 cm loculated fluid in the right lateral abdominal wall surrounding the proximal portion of the coiled wire. This is similar to prior CT and may represent a chronic collection or seroma. Clinical correlation is recommended to evaluate for possibility infection. The fluid appears to extend posterolaterally along the subcutaneous soft tissues of the right lateral lower chest wall along the proximal portion of the wire. Musculoskeletal: Degenerative changes of the spine and hips. No acute osseous pathology. IMPRESSION: 1. Extensive sigmoid diverticulosis. Minimal stranding adjacent to the sigmoid colon, likely chronic and scarring. Mild  acute colitis is less likely but not excluded. Clinical correlation is recommended. No bowel obstruction. Normal appendix. 2. Myomatous uterus. 3. Loculated fluid collection in the subcutaneous soft tissues of the right lateral lower chest wall extending along the proximal portion of the spinal stimulator wire. This is similar to prior study and may represent a seroma. Clinical correlation is recommended to exclude an infectious process. 4. Aortic Atherosclerosis (ICD10-I70.0). Electronically Signed   By: Anner Crete M.D.   On: 05/13/2019 22:09    Procedures Procedures (including critical care time)  Medications Ordered in ED Medications  sodium chloride flush (NS) 0.9 % injection 3 mL (has no administration in time range)     Initial Impression / Assessment and Plan / ED Course  I have reviewed the triage vital signs and the nursing notes.  Pertinent labs & imaging results that were available during my care of the patient were reviewed by me and considered in my medical decision making (see chart for details).   Labs stable.  CT scan without signs of complication.  ?diverticulitis vs chronic scarring per repeat CT.   Recent pelvic and US shows uterine fibroids.  Pt on xarelto.   Pt will need OB follow up but no acute intervention needed today.  Stable for outpatient follow up  Final Clinical Impressions(s) / ED Diagnoses   Final diagnoses:  Diverticulitis  Uterine leiomyoma, unspecified location    ED Discharge Orders    None       Dorie Rank, MD 05/14/19 0900

## 2019-05-13 NOTE — ED Notes (Signed)
Pt verbalized discharge instructions and follow up care. Alert and ambulatory. NAD. No IV. Pt refused vitals prior to leaving

## 2019-05-13 NOTE — ED Notes (Addendum)
Pt ambulated back to her room without assistance. Gait steady. Pt unable to provide urine sample

## 2019-05-13 NOTE — ED Triage Notes (Signed)
Patient is complaining of mid-lower abd pain, mid/lower back pain, and vaginal bleeding. Symptoms started today.

## 2019-05-13 NOTE — ED Provider Notes (Signed)
Dx diverticulitis several days ago  Back to ED with vag d/ch Increasing AP, vag bleeding - h/o fibroids  Pending CT to evaluate worsening diverticulitis Has been referred to GYN for fibroids Anticipate d/ch home  CT shows persistent but not worse diverticulitis. No abscess or perforation. She is walking around the room on recheck without difficulty, in NAD.   She can be discharged home with PCP follow up. She has been referred to GYN during previous visit for uterine fibroids. She reports vaginal bleeding with her fibroids, on Eliquis, Hgb normal.   Results discussed with the patient who is comfortable with plan of discharge.    Charlann Lange, PA-C 05/13/19 2259    Dorie Rank, MD 05/14/19 [phone]

## 2019-05-13 NOTE — Discharge Instructions (Addendum)
Your CT scan and exam today show that you still have diverticulitis but it is no worse and you should continue your antibiotics for this. Your spinal stimulator is in place and does not show any new infection associated with it.   You have uterine fibroids and should follow up with gynecology for further treatment.

## 2019-05-13 NOTE — ED Notes (Addendum)
Pt ambulated to the bathroom without assistance to attempt to give a urine sample

## 2019-05-20 ENCOUNTER — Telehealth: Payer: Self-pay | Admitting: Emergency Medicine

## 2019-05-20 NOTE — Telephone Encounter (Signed)
Pt called nurse voicemail line and left a message stating she wanted her test results.

## 2019-05-21 NOTE — Telephone Encounter (Signed)
Blakley left another message she missed our call about results. I called Peyton again and left another message I am returning her call and cannot answer her questions. Please review with provider at her appointmetn 8/27 . Inari Shin,RN

## 2019-05-21 NOTE — Telephone Encounter (Signed)
I called Kae back and left a message I am returning her call and unfortunately cannot answer her questions; but she can address at her appointment in our office on 05/29/19 or call back where she was seen before. Andrell Tallman,RN

## 2019-05-29 ENCOUNTER — Encounter: Payer: Self-pay | Admitting: Family Medicine

## 2019-05-29 ENCOUNTER — Other Ambulatory Visit: Payer: Self-pay

## 2019-05-29 ENCOUNTER — Ambulatory Visit (INDEPENDENT_AMBULATORY_CARE_PROVIDER_SITE_OTHER): Payer: Medicare Other | Admitting: Family Medicine

## 2019-05-29 DIAGNOSIS — D252 Subserosal leiomyoma of uterus: Secondary | ICD-10-CM | POA: Diagnosis not present

## 2019-05-29 DIAGNOSIS — D251 Intramural leiomyoma of uterus: Secondary | ICD-10-CM

## 2019-05-29 DIAGNOSIS — K5792 Diverticulitis of intestine, part unspecified, without perforation or abscess without bleeding: Secondary | ICD-10-CM

## 2019-05-29 NOTE — Patient Instructions (Signed)

## 2019-05-29 NOTE — Progress Notes (Signed)
   Subjective:    Patient ID: Andrea Bryan is a 53 y.o. female presenting with Follow-up (from ER)  on 05/29/2019  HPI: Referred from ED due to fibroids. Multiple visits to ED due to abdominal pain, with ? Diverticulitis. No vaginal bleeding x 2 years. Last pap smear is unknown. She reports pain has completely resolved. She did get course of antibiotics.  Review of Systems  Constitutional: Negative for chills and fever.  Respiratory: Negative for shortness of breath.   Cardiovascular: Negative for chest pain.  Gastrointestinal: Negative for abdominal pain, nausea and vomiting.  Genitourinary: Negative for dysuria.  Skin: Negative for rash.      Objective:    BP (!) 149/76   Pulse (!) 101   Ht 5\' 6"  (1.676 m)   Wt 222 lb (100.7 kg)   BMI 35.83 kg/m  Physical Exam Constitutional:      General: She is not in acute distress.    Appearance: She is well-developed.  HENT:     Head: Normocephalic and atraumatic.  Eyes:     General: No scleral icterus. Neck:     Musculoskeletal: Neck supple.  Cardiovascular:     Rate and Rhythm: Normal rate.  Pulmonary:     Effort: Pulmonary effort is normal.  Abdominal:     Palpations: Abdomen is soft.  Skin:    General: Skin is warm and dry.  Neurological:     Mental Status: She is alert and oriented to person, place, and time.         Assessment & Plan:   Problem List Items Addressed This Visit      Unprioritized   Fibroid uterus    No symptoms and post-menopausal--advised of normal progression. No treatment needed.      Diverticulitis    Suspect this caused her pain and has now resolved.         Total face-to-face time with patient: 20 minutes. Over 50% of encounter was spent on counseling and coordination of care. Return in about 4 weeks (around 06/26/2019) for a CPE wih pap smear.  Donnamae Jude 05/31/2019 9:26 AM

## 2019-05-31 ENCOUNTER — Encounter: Payer: Self-pay | Admitting: Family Medicine

## 2019-05-31 DIAGNOSIS — E039 Hypothyroidism, unspecified: Secondary | ICD-10-CM | POA: Insufficient documentation

## 2019-05-31 DIAGNOSIS — E7800 Pure hypercholesterolemia, unspecified: Secondary | ICD-10-CM | POA: Insufficient documentation

## 2019-05-31 DIAGNOSIS — D259 Leiomyoma of uterus, unspecified: Secondary | ICD-10-CM | POA: Insufficient documentation

## 2019-05-31 DIAGNOSIS — I10 Essential (primary) hypertension: Secondary | ICD-10-CM | POA: Insufficient documentation

## 2019-05-31 DIAGNOSIS — K5792 Diverticulitis of intestine, part unspecified, without perforation or abscess without bleeding: Secondary | ICD-10-CM | POA: Insufficient documentation

## 2019-05-31 NOTE — Assessment & Plan Note (Signed)
Suspect this caused her pain and has now resolved.

## 2019-05-31 NOTE — Assessment & Plan Note (Signed)
No symptoms and post-menopausal--advised of normal progression. No treatment needed.

## 2019-06-27 ENCOUNTER — Other Ambulatory Visit: Payer: Self-pay

## 2019-06-27 DIAGNOSIS — Z20822 Contact with and (suspected) exposure to covid-19: Secondary | ICD-10-CM

## 2019-06-28 LAB — NOVEL CORONAVIRUS, NAA: SARS-CoV-2, NAA: NOT DETECTED

## 2019-07-05 ENCOUNTER — Emergency Department (HOSPITAL_COMMUNITY): Payer: Medicare Other

## 2019-07-05 ENCOUNTER — Emergency Department (HOSPITAL_COMMUNITY)
Admission: EM | Admit: 2019-07-05 | Discharge: 2019-07-05 | Disposition: A | Discharge status: Home / Self Care | Payer: Medicare Other | Attending: Emergency Medicine | Admitting: Emergency Medicine

## 2019-07-05 ENCOUNTER — Other Ambulatory Visit: Payer: Self-pay

## 2019-07-05 ENCOUNTER — Encounter (HOSPITAL_COMMUNITY): Payer: Self-pay | Admitting: Emergency Medicine

## 2019-07-05 DIAGNOSIS — I10 Essential (primary) hypertension: Secondary | ICD-10-CM | POA: Insufficient documentation

## 2019-07-05 DIAGNOSIS — Z7901 Long term (current) use of anticoagulants: Secondary | ICD-10-CM | POA: Insufficient documentation

## 2019-07-05 DIAGNOSIS — M159 Polyosteoarthritis, unspecified: Secondary | ICD-10-CM | POA: Diagnosis not present

## 2019-07-05 DIAGNOSIS — E039 Hypothyroidism, unspecified: Secondary | ICD-10-CM | POA: Insufficient documentation

## 2019-07-05 DIAGNOSIS — Z7984 Long term (current) use of oral hypoglycemic drugs: Secondary | ICD-10-CM | POA: Insufficient documentation

## 2019-07-05 DIAGNOSIS — S60440A External constriction of right index finger, initial encounter: Secondary | ICD-10-CM | POA: Diagnosis not present

## 2019-07-05 DIAGNOSIS — Y939 Activity, unspecified: Secondary | ICD-10-CM | POA: Diagnosis not present

## 2019-07-05 DIAGNOSIS — E119 Type 2 diabetes mellitus without complications: Secondary | ICD-10-CM | POA: Insufficient documentation

## 2019-07-05 DIAGNOSIS — Y999 Unspecified external cause status: Secondary | ICD-10-CM | POA: Insufficient documentation

## 2019-07-05 DIAGNOSIS — W4904XA Ring or other jewelry causing external constriction, initial encounter: Secondary | ICD-10-CM | POA: Insufficient documentation

## 2019-07-05 DIAGNOSIS — Y929 Unspecified place or not applicable: Secondary | ICD-10-CM | POA: Insufficient documentation

## 2019-07-05 DIAGNOSIS — Z79899 Other long term (current) drug therapy: Secondary | ICD-10-CM | POA: Insufficient documentation

## 2019-07-05 DIAGNOSIS — R079 Chest pain, unspecified: Secondary | ICD-10-CM | POA: Diagnosis present

## 2019-07-05 HISTORY — DX: Type 2 diabetes mellitus without complications: E11.9

## 2019-07-05 LAB — CBC
HCT: 36.8 % (ref 36.0–46.0)
Hemoglobin: 11.4 g/dL — ABNORMAL LOW (ref 12.0–15.0)
MCH: 29.7 pg (ref 26.0–34.0)
MCHC: 31 g/dL (ref 30.0–36.0)
MCV: 95.8 fL (ref 80.0–100.0)
Platelets: 245 10*3/uL (ref 150–400)
RBC: 3.84 MIL/uL — ABNORMAL LOW (ref 3.87–5.11)
RDW: 13.9 % (ref 11.5–15.5)
WBC: 7.3 10*3/uL (ref 4.0–10.5)
nRBC: 0 % (ref 0.0–0.2)

## 2019-07-05 LAB — BASIC METABOLIC PANEL WITH GFR
Anion gap: 9 (ref 5–15)
BUN: 31 mg/dL — ABNORMAL HIGH (ref 6–20)
CO2: 27 mmol/L (ref 22–32)
Calcium: 9 mg/dL (ref 8.9–10.3)
Chloride: 105 mmol/L (ref 98–111)
Creatinine, Ser: 0.87 mg/dL (ref 0.44–1.00)
GFR calc Af Amer: >60 mL/min (ref >60)
GFR calc non Af Amer: >60 mL/min (ref >60)
Glucose, Bld: 124 mg/dL — ABNORMAL HIGH (ref 70–99)
Potassium: 4.1 mmol/L (ref 3.5–5.1)
Sodium: 141 mmol/L (ref 135–145)

## 2019-07-05 LAB — TROPONIN I (HIGH SENSITIVITY)
Troponin I (High Sensitivity): 4 ng/L (ref <18)
Troponin I (High Sensitivity): 4 ng/L (ref <18)

## 2019-07-05 MED ORDER — DICLOFENAC SODIUM 1.6 % TD GEL: 2.0000 g | Freq: Four times a day (QID) | TRANSDERMAL | 0 refills | Status: AC | PRN

## 2019-07-05 MED ORDER — METHOCARBAMOL 500 MG PO TABS
500.0000 mg | ORAL_TABLET | Freq: Once | ORAL | Status: AC
  Administered 2019-07-05: 500 mg via ORAL
  Filled 2019-07-05: qty 1

## 2019-07-05 MED ORDER — SODIUM CHLORIDE 0.9% FLUSH: 3.0000 mL | Freq: Once | INTRAVENOUS | Status: DC

## 2019-07-05 MED ORDER — KETOROLAC TROMETHAMINE 60 MG/2ML IM SOLN
30.0000 mg | Freq: Once | INTRAMUSCULAR | Status: AC
  Administered 2019-07-05: 30 mg via INTRAMUSCULAR
  Filled 2019-07-05: qty 2

## 2019-07-05 MED ORDER — LIDOCAINE 5 % EX PTCH: 1.0000 | MEDICATED_PATCH | CUTANEOUS | 0 refills | Status: AC

## 2019-07-05 NOTE — ED Triage Notes (Signed)
PT reports having right sided chest pain with radiation into right arm and back for the last 3 days.

## 2019-07-05 NOTE — ED Provider Notes (Signed)
[facility] DEPT Provider Note   CSN: [id number] Arrival date & time: 07/05/19  1916     History   Chief Complaint Chief Complaint  Patient presents with  . Chest Pain    HPI Andrea Bryan is a 53 y.o. female with a history of diabetes mellitus type 2, HTN, uterine fibroids, hypercholesterolemia, hypothyroidism, and diverticulosis who presents to the emergency department with multiple complaints.  Her primary reason for presenting to the ER today was that she had a ring stuck on her right second finger that she was unable to remove at home.  She denies numbness, weakness, or blue discoloration to the digit.  She also reports that she has been having right shoulder and hip pain for several days.  Pain has been constant and worse with movement, particularly walking.  She states that the pain in her right hip radiates down into her thigh as well as up her right flank.  She has a history of a left knee replacement and has been having some pain and stiffness in the joint and feels that when she walks she bears more weight on her right leg.  She reports that she takes the bus to the [place] and has pain with stepping up into the bus.  She noted that her shoulder was more painful after carrying bags of laundry.  She states that the right shoulder pain is sharp and constant and radiates up the right side of her neck.  Pain is worse when uses her right upper extremity to pull herself up.  No recent falls or injuries.  No new exercises.  No chest pain, numbness, weakness, dizziness, lightheadedness, visual changes, redness, swelling, or drainage from the joints, no other arthralgias, rashes, shortness of breath, cough, fever, chills, leg swelling, or palpitations.     The history is provided by the patient. No language interpreter was used.    Past Medical History:  Diagnosis Date  . Diabetes mellitus without complication ([place])   . Hypertension     Patient  Active Problem List   Diagnosis Date Noted  . Essential hypertension 05/31/2019  . Hypothyroidism 05/31/2019  . Hypercholesteremia 05/31/2019  . Fibroid uterus 05/31/2019  . Diverticulitis 05/31/2019    Past Surgical History:  Procedure Laterality Date  . KNEE SURGERY       OB History   No obstetric history on file.      Home Medications    Prior to Admission medications   Medication Sig Start Date End Date Taking? Authorizing Provider  acetaminophen (TYLENOL) 500 MG tablet Take 500 mg by mouth every 6 (six) hours as needed.   Yes [provider]  atenolol (TENORMIN) 100 MG tablet Take 100 mg by mouth daily. 09/19/16  Yes [provider]  cyclobenzaprine (FLEXERIL) 5 MG tablet Take 5 mg by mouth 3 (three) times daily.  05/08/19  Yes [provider]  ELIQUIS 5 MG TABS tablet Take 5 mg by mouth 2 (two) times daily. 03/03/19  Yes [provider]  FEROSUL 325 (65 Fe) MG tablet Take 325 mg by mouth daily with breakfast.  05/08/19  Yes [provider]  fluticasone (FLONASE) 50 MCG/ACT nasal spray Place 1 spray into both nostrils daily as needed for allergies.  09/19/16  Yes [provider]  levothyroxine (SYNTHROID) 75 MCG tablet Take 75 mcg by mouth daily before breakfast. 09/19/16  Yes [provider]  lisinopril (ZESTRIL) 40 MG tablet Take 20 mg by mouth daily. 09/19/16  Yes  [provider]  loratadine (CLARITIN) 10 MG tablet Take 10 mg by mouth daily as needed for allergies.    Yes [provider]  omeprazole (PRILOSEC) 20 MG capsule Take 20 mg by mouth daily. 02/19/18  Yes [provider]  oxybutynin (DITROPAN-XL) 10 MG 24 hr tablet Take 10 mg by mouth at bedtime.  05/08/19  Yes [provider]  risperidone (RISPERDAL) 4 MG tablet Take 4 mg by mouth at bedtime. 06/03/19  Yes [provider]  simvastatin (ZOCOR) 20 MG tablet Take 20 mg by mouth daily. 09/19/16  Yes [provider]  SUMAtriptan (IMITREX) 50 MG tablet Take 50 mg by mouth See admin instructions. Take 1 tab at onset of migraine may repeat in 2 hours no more than 4 in 2 hours. 06/03/19  Yes [provider]  Diclofenac Sodium 1.6 % GEL Place 2 g onto the skin every 6 (six) hours as needed (Pain). 07/05/19   Jabriel Vanduyne A, PA-C  JANUVIA 100 MG tablet Take 100 mg by mouth daily. 06/03/19   [provider]  lidocaine (LIDODERM) 5 % Place 1 patch onto the skin daily. Remove & Discard patch within 12 hours or as directed by MD 07/05/19   Joanne Gavel, PA-C    Family History Family History  Problem Relation Age of Onset  . Diabetes Father   . Hypertension Father   . Hypertension Mother   . Diabetes Mother     Social History Social History   Tobacco Use  . Smoking status: Never Smoker  . Smokeless tobacco: Never Used  Substance Use Topics  . Alcohol use: Not Currently  . Drug use: Not Currently     Allergies   Metformin, Atorvastatin, Oxycodone, and Tramadol   Review of Systems Review of Systems  Constitutional: Negative for activity change, chills and fever.  Respiratory: Negative for shortness of breath and wheezing.   Cardiovascular: Negative for chest pain and palpitations.  Gastrointestinal: Negative for abdominal pain, diarrhea, nausea and vomiting.  Genitourinary: Negative for dysuria.  Musculoskeletal: Positive for arthralgias, gait problem, myalgias and neck pain. Negative for back pain, joint swelling and neck stiffness.  Skin: Negative for color change, rash and wound.  Allergic/Immunologic: Negative for immunocompromised state.  Neurological: Negative for dizziness, seizures, syncope, weakness, numbness and headaches.  Psychiatric/Behavioral: Negative for confusion.     Physical Exam Updated Vital Signs BP (!) 150/83   Pulse 64   Temp 98.5 F (36.9 C) (Oral)   Resp 17   Ht 5\' 6"  (1.676 m)   Wt 106.1 kg   SpO2 100%   BMI 37.77 kg/m   Physical  Exam Vitals signs and nursing note reviewed.  Constitutional:      General: She is not in acute distress.    Appearance: She is obese.  HENT:     Head: Normocephalic.  Eyes:     Conjunctiva/sclera: Conjunctivae normal.  Neck:     Musculoskeletal: Neck supple.  Cardiovascular:     Rate and Rhythm: Normal rate and regular rhythm.     Pulses: Normal pulses.     Heart sounds: Normal heart sounds. No murmur. No friction rub. No gallop.   Pulmonary:     Effort: Pulmonary effort is normal. No respiratory distress.     Breath sounds: No stridor. No wheezing, rhonchi or rales.  Chest:     Chest wall: No tenderness.  Abdominal:     General: There is no distension.     Palpations:  Abdomen is soft. There is no mass.     Tenderness: There is no abdominal tenderness. There is no right CVA tenderness, left CVA tenderness, guarding or rebound.     Hernia: No hernia is present.  Musculoskeletal:     Comments: Reproducible tenderness to palpation over the right lateral hip.  There is no crepitus or obvious deformities.  Pain with active and passive range of motion.  No overlying redness, warmth.  Right knee and ankle are nontender.  Tender to palpation over the left knee.  No overlying redness or warmth.  Full active and passive range of motion.  Normal exam of the left hip and ankle.  She is able to stand and bear weight on the bilateral lower extremities.  She is neurovascular intact.  Negative straight leg raise.  No tenderness to the spinous processes of the cervical, thoracic, or lumbar spine or bilateral paraspinal muscles.  No tenderness palpation of the bilateral SI joints.  Skin:    General: Skin is warm.     Findings: No rash.  Neurological:     Mental Status: She is alert.  Psychiatric:        Behavior: Behavior normal.      ED Treatments / Results  Labs (all labs ordered are listed, but only abnormal results are displayed) Labs Reviewed  BASIC METABOLIC PANEL - Abnormal;  Notable for the following components:      Result Value   Glucose, Bld 124 (*)    BUN 31 (*)    All other components within normal limits  CBC - Abnormal; Notable for the following components:   RBC 3.84 (*)    Hemoglobin 11.4 (*)    All other components within normal limits  TROPONIN I (HIGH SENSITIVITY)  TROPONIN I (HIGH SENSITIVITY)    EKG EKG Interpretation  Date/Time:  Saturday July 05 2019 19:28:27 EDT Ventricular Rate:  70 PR Interval:    QRS Duration: 96 QT Interval:  389 QTC Calculation: 420 R Axis:   23 Text Interpretation:  Sinus rhythm Probable left atrial enlargement No significant change since last tracing Confirmed by Blanchie Dessert [phone]) on 07/05/2019 10:42:44 PM   Radiology Dg Chest 2 View  Result Date: 07/05/2019 CLINICAL DATA:  Chest pain EXAM: CHEST - 2 VIEW COMPARISON:  None. FINDINGS: The heart size and mediastinal contours are within normal limits. Minimal aortic knob calcification. Both lungs are clear. Cervical spine fixation hardware seen. IMPRESSION: No acute cardiopulmonary process. Electronically Signed   By: Prudencio Pair M.D.   On: 07/05/2019 20:18   Dg Hip Unilat W Or Wo Pelvis 2-3 Views Right  Result Date: 07/05/2019 CLINICAL DATA:  Right-sided hip pain EXAM: DG HIP (WITH OR WITHOUT PELVIS) 2-3V RIGHT COMPARISON:  None. FINDINGS: SI joints are non widened. Pubic symphysis and rami are intact. Both femoral heads project in joint. No fracture or malalignment. Moderate arthritis of the right hip. Subchondral sclerosis. Mild femoral head neck spurring. IMPRESSION: 1. No acute osseous abnormality 2. Moderate arthritis of the right hip Electronically Signed   By: Donavan Foil M.D.   On: 07/05/2019 22:54    Procedures Procedures (including critical care time)  Medications Ordered in ED Medications  sodium chloride flush (NS) 0.9 % injection 3 mL (3 mLs Intravenous Not Given 07/05/19 2259)  ketorolac (TORADOL) injection 30 mg (30 mg  Intramuscular Given 07/05/19 2258)  methocarbamol (ROBAXIN) tablet 500 mg (500 mg Oral Given 07/05/19 2258)     Initial Impression / Assessment and Plan /  ED Course  I have reviewed the triage vital signs and the nursing notes.  Pertinent labs & imaging results that were available during my care of the patient were reviewed by me and considered in my medical decision making (see chart for details).        53 year old female with a history of diabetes mellitus type 2, HTN, uterine fibroids, hypercholesterolemia, hypothyroidism, and diverticulosis presenting with a ring stuck on the right second finger.  Ring was removed by nursing staff using lubrication prior to my evaluation.  She is also requesting to be evaluated for right hip and shoulder pain.  She has a history of a left knee replacement that [facility] [place].  She has had increasing stiffness in the joint, but no signs or symptoms concerning for gout or septic joint.  Due to the worsening stiffness, she has been bearing more weight on the right side of her body.  She takes the bus to the [place] and is carrying bags of laundry and thus her right shoulder has started to hurt.  She has reproducible pain to the right shoulder and hip on exam.  X-ray of the right hip with moderate arthritis.  The triage note indicates that she is endorsing chest pain, which she adamantly denies to me. EKG unchanged from previous.  Her BUN is elevated to 31, but creatinine is normal and she has no dyspepsia or melena or hematochezia.  Remainder of her labs are unremarkable.  Her symptoms are not concerning for ACS.  Strong suspicion for musculoskeletal etiology.  She was given Robaxin and Tylenol in the ER.  Will discharge with diclofenac gel and Lidoderm patches with orthopedist follow-up since she is not established with an orthopedist locally.  She is agreeable with the plan.  She is hemodynamically stable and in no acute distress.  Safe  for discharge home with outpatient follow-up this time.  Final Clinical Impressions(s) / ED Diagnoses   Final diagnoses:  Osteoarthritis of multiple joints, unspecified osteoarthritis type  Ring or other jewelry causing external constriction, initial encounter    ED Discharge Orders         Ordered    Diclofenac Sodium 1.6 % GEL  Every 6 hours PRN     07/05/19 2323    lidocaine (LIDODERM) 5 %  Every 24 hours     07/05/19 2323           Joline Maxcy A, PA-C 07/05/19 2337    Blanchie Dessert, MD 07/06/19 0002

## 2019-07-05 NOTE — ED Notes (Signed)
Pt reporting swelling in the right hand, second finger and unable to remove her ring. Lubrication applied and ring was removed. Pt does have swelling to the finger, denies injury to the same.

## 2019-07-05 NOTE — Discharge Instructions (Addendum)
Thank you for allowing me to care for you today in the Emergency Department.   You can call to schedule follow-up with orthopedics if you continue to have pain in the left knee that was previously replaced.  You can apply a thin layer of diclofenac gel to the right hip every 6 hours as needed.  You should not use this medication for more than 21 days and you should not use it on more than 2 areas on the body at a time.  You can apply 1 Lidoderm patch onto the skin to areas that are painful daily.  Take 1000 mg of Tylenol every 8 hours for pain.  Follow-up with primary care orthopedics if your symptoms do not improve with this management in the next few weeks.  Return to the emergency department if you develop fevers, chills, if the joints get red, hot to the touch, if you become unable to walk, or develop new numbness, or weakness, or other new, concerning symptoms.

## 2019-07-05 NOTE — ED Notes (Signed)
Patient transported to X-ray 

## 2019-07-07 ENCOUNTER — Telehealth: Payer: Self-pay

## 2019-07-07 NOTE — Telephone Encounter (Signed)
Pt left message on nurse VM stating she was returning a call from our office. Chart review does not show any outgoing calls from our office staff.  Called pt to notify her she was receiving an automated message reminding her of her upcoming appointment. Pt denies any other questions at this time and states she will call back with any future concerns.

## 2019-07-08 ENCOUNTER — Telehealth: Payer: Self-pay | Admitting: Student

## 2019-07-08 NOTE — Telephone Encounter (Signed)
Called the patient to confirm the upcoming appointment. The patient answered no to all of the covid screening questions. Also informed of no visitors or children due to covid restrictions. The patient verbalized understanding.

## 2019-07-09 ENCOUNTER — Encounter: Payer: Self-pay | Admitting: Family Medicine

## 2019-07-09 ENCOUNTER — Other Ambulatory Visit (HOSPITAL_COMMUNITY)
Admission: RE | Admit: 2019-07-09 | Discharge: 2019-07-09 | Disposition: A | Discharge status: Home / Self Care | Payer: Medicare Other | Source: Ambulatory Visit | Attending: Family Medicine | Admitting: Family Medicine

## 2019-07-09 ENCOUNTER — Other Ambulatory Visit: Payer: Self-pay

## 2019-07-09 ENCOUNTER — Ambulatory Visit (INDEPENDENT_AMBULATORY_CARE_PROVIDER_SITE_OTHER): Payer: Medicare Other | Admitting: Family Medicine

## 2019-07-09 DIAGNOSIS — G4700 Insomnia, unspecified: Secondary | ICD-10-CM | POA: Insufficient documentation

## 2019-07-09 DIAGNOSIS — G43009 Migraine without aura, not intractable, without status migrainosus: Secondary | ICD-10-CM

## 2019-07-09 DIAGNOSIS — Z780 Asymptomatic menopausal state: Secondary | ICD-10-CM | POA: Insufficient documentation

## 2019-07-09 DIAGNOSIS — M1990 Unspecified osteoarthritis, unspecified site: Secondary | ICD-10-CM | POA: Insufficient documentation

## 2019-07-09 DIAGNOSIS — N3941 Urge incontinence: Secondary | ICD-10-CM | POA: Insufficient documentation

## 2019-07-09 DIAGNOSIS — M8949 Other hypertrophic osteoarthropathy, multiple sites: Secondary | ICD-10-CM

## 2019-07-09 DIAGNOSIS — M159 Polyosteoarthritis, unspecified: Secondary | ICD-10-CM

## 2019-07-09 DIAGNOSIS — I2699 Other pulmonary embolism without acute cor pulmonale: Secondary | ICD-10-CM | POA: Insufficient documentation

## 2019-07-09 DIAGNOSIS — G43909 Migraine, unspecified, not intractable, without status migrainosus: Secondary | ICD-10-CM | POA: Insufficient documentation

## 2019-07-09 DIAGNOSIS — F5101 Primary insomnia: Secondary | ICD-10-CM

## 2019-07-09 DIAGNOSIS — Z23 Encounter for immunization: Secondary | ICD-10-CM | POA: Diagnosis not present

## 2019-07-09 DIAGNOSIS — Z1151 Encounter for screening for human papillomavirus (HPV): Secondary | ICD-10-CM | POA: Diagnosis not present

## 2019-07-09 DIAGNOSIS — Z124 Encounter for screening for malignant neoplasm of cervix: Secondary | ICD-10-CM | POA: Diagnosis present

## 2019-07-09 DIAGNOSIS — Z01419 Encounter for gynecological examination (general) (routine) without abnormal findings: Secondary | ICD-10-CM | POA: Diagnosis not present

## 2019-07-09 DIAGNOSIS — K219 Gastro-esophageal reflux disease without esophagitis: Secondary | ICD-10-CM | POA: Insufficient documentation

## 2019-07-09 NOTE — Patient Instructions (Signed)

## 2019-07-09 NOTE — Progress Notes (Signed)
  Subjective:     Andrea Bryan is a 53 y.o. female and is here for a comprehensive physical exam. The patient reports no problems.     The following portions of the patient's history were reviewed and updated as appropriate: allergies, current medications, past family history, past medical history, past social history, past surgical history and problem list.  Review of Systems Pertinent items noted in HPI and remainder of comprehensive ROS otherwise negative.   Objective:    BP (!) 146/83   Pulse 64   Wt 239 lb 3.2 oz (108.5 kg)   BMI 38.61 kg/m  General appearance: alert, cooperative, appears stated age and moderately obese Head: Normocephalic, without obvious abnormality, atraumatic Neck: no adenopathy, supple, symmetrical, trachea midline and thyroid not enlarged, symmetric, no tenderness/mass/nodules Lungs: clear to auscultation bilaterally Breasts: normal appearance, no masses or tenderness, inverted nipples bilaterally Heart: regular rate and rhythm, S1, S2 normal, no murmur, click, rub or gallop Abdomen: soft, non-tender; bowel sounds normal; no masses,  no organomegaly Pelvic: cervix normal in appearance, external genitalia normal, no adnexal masses or tenderness, no cervical motion tenderness, uterus normal size, shape, and consistency, vagina normal without discharge and exam limited by boday habitus Extremities: extremities normal, atraumatic, no cyanosis or edema Pulses: 2+ and symmetric Skin: Skin color, texture, turgor normal. No rashes or lesions Lymph nodes: Cervical, supraclavicular, and axillary nodes normal. Neurologic: Grossly normal    Assessment:    GYN female exam.      Plan:      Problem List Items Addressed This Visit      Unprioritized   Pulmonary embolism (HCC)   GERD (gastroesophageal reflux disease)   Migraine   Urge incontinence   Insomnia   Osteoarthritis    Other Visit Diagnoses    Screening for malignant neoplasm of cervix       Relevant Orders   Cytology - PAP( [place])   Encounter for gynecological examination without abnormal finding       Relevant Orders   MM DIGITAL SCREENING BILATERAL   Tdap vaccine greater than or equal to 7yo IM (Completed)     Declines colonoscopy as she is in a shelter right now.    Return in 1 year (on 07/08/2020) for schedule mammogram.  See After Visit Summary for Counseling Recommendations

## 2019-07-09 NOTE — Progress Notes (Signed)
Here for a physical exam no concerns

## 2019-07-17 LAB — CYTOLOGY - PAP
Adequacy: ABSENT
Comment: NEGATIVE
Diagnosis: NEGATIVE
High risk HPV: NEGATIVE

## 2019-08-12 ENCOUNTER — Other Ambulatory Visit: Payer: Self-pay | Admitting: Physician Assistant

## 2019-08-12 DIAGNOSIS — Z1231 Encounter for screening mammogram for malignant neoplasm of breast: Secondary | ICD-10-CM

## 2019-09-22 ENCOUNTER — Other Ambulatory Visit: Payer: Self-pay

## 2019-09-22 DIAGNOSIS — Z20822 Contact with and (suspected) exposure to covid-19: Secondary | ICD-10-CM

## 2019-09-23 LAB — NOVEL CORONAVIRUS, NAA: SARS-CoV-2, NAA: NOT DETECTED

## 2019-10-08 ENCOUNTER — Ambulatory Visit
Admission: RE | Admit: 2019-10-08 | Discharge: 2019-10-08 | Disposition: A | Discharge status: Home / Self Care | Payer: Medicare Other | Source: Ambulatory Visit | Attending: Physician Assistant | Admitting: Physician Assistant

## 2019-10-08 ENCOUNTER — Other Ambulatory Visit: Payer: Self-pay

## 2019-10-08 DIAGNOSIS — Z1231 Encounter for screening mammogram for malignant neoplasm of breast: Secondary | ICD-10-CM

## 2019-12-04 ENCOUNTER — Other Ambulatory Visit: Payer: Self-pay

## 2019-12-04 ENCOUNTER — Encounter (HOSPITAL_COMMUNITY): Payer: Self-pay | Admitting: Emergency Medicine

## 2019-12-04 ENCOUNTER — Emergency Department (HOSPITAL_COMMUNITY)
Admission: EM | Admit: 2019-12-04 | Discharge: 2019-12-05 | Disposition: A | Discharge status: Home / Self Care | Payer: Medicare Other | Attending: Emergency Medicine | Admitting: Emergency Medicine

## 2019-12-04 DIAGNOSIS — R1084 Generalized abdominal pain: Secondary | ICD-10-CM | POA: Diagnosis not present

## 2019-12-04 DIAGNOSIS — E119 Type 2 diabetes mellitus without complications: Secondary | ICD-10-CM | POA: Diagnosis not present

## 2019-12-04 DIAGNOSIS — E039 Hypothyroidism, unspecified: Secondary | ICD-10-CM | POA: Insufficient documentation

## 2019-12-04 DIAGNOSIS — R110 Nausea: Secondary | ICD-10-CM | POA: Diagnosis not present

## 2019-12-04 DIAGNOSIS — I10 Essential (primary) hypertension: Secondary | ICD-10-CM | POA: Insufficient documentation

## 2019-12-04 DIAGNOSIS — Z79899 Other long term (current) drug therapy: Secondary | ICD-10-CM | POA: Insufficient documentation

## 2019-12-04 DIAGNOSIS — Z7901 Long term (current) use of anticoagulants: Secondary | ICD-10-CM | POA: Insufficient documentation

## 2019-12-04 DIAGNOSIS — M25552 Pain in left hip: Secondary | ICD-10-CM

## 2019-12-04 DIAGNOSIS — Z7984 Long term (current) use of oral hypoglycemic drugs: Secondary | ICD-10-CM | POA: Diagnosis not present

## 2019-12-04 LAB — CBC
HCT: 35.6 % — ABNORMAL LOW (ref 36.0–46.0)
Hemoglobin: 10.9 g/dL — ABNORMAL LOW (ref 12.0–15.0)
MCH: 29.9 pg (ref 26.0–34.0)
MCHC: 30.6 g/dL (ref 30.0–36.0)
MCV: 97.5 fL (ref 80.0–100.0)
Platelets: 245 10*3/uL (ref 150–400)
RBC: 3.65 MIL/uL — ABNORMAL LOW (ref 3.87–5.11)
RDW: 14.1 % (ref 11.5–15.5)
WBC: 7.6 10*3/uL (ref 4.0–10.5)
nRBC: 0.3 % — ABNORMAL HIGH (ref 0.0–0.2)

## 2019-12-04 LAB — COMPREHENSIVE METABOLIC PANEL
ALT: 15 U/L (ref 0–44)
AST: 18 U/L (ref 15–41)
Albumin: 3.8 g/dL (ref 3.5–5.0)
Alkaline Phosphatase: 73 U/L (ref 38–126)
Anion gap: 8 (ref 5–15)
BUN: 25 mg/dL — ABNORMAL HIGH (ref 6–20)
CO2: 26 mmol/L (ref 22–32)
Calcium: 8.7 mg/dL — ABNORMAL LOW (ref 8.9–10.3)
Chloride: 106 mmol/L (ref 98–111)
Creatinine, Ser: 0.76 mg/dL (ref 0.44–1.00)
GFR calc Af Amer: >60 mL/min (ref >60)
GFR calc non Af Amer: >60 mL/min (ref >60)
Glucose, Bld: 117 mg/dL — ABNORMAL HIGH (ref 70–99)
Potassium: 4 mmol/L (ref 3.5–5.1)
Sodium: 140 mmol/L (ref 135–145)
Total Bilirubin: 0.9 mg/dL (ref 0.3–1.2)
Total Protein: 7 g/dL (ref 6.5–8.1)

## 2019-12-04 LAB — LIPASE, BLOOD: Lipase: 22 U/L (ref 11–51)

## 2019-12-04 LAB — I-STAT BETA HCG BLOOD, ED (MC, WL, AP ONLY): I-stat hCG, quantitative: <5 m[IU]/mL (ref <5)

## 2019-12-04 MED ORDER — ONDANSETRON 4 MG PO TBDP: 4.0000 mg | ORAL_TABLET | Freq: Once | ORAL | Status: DC | PRN

## 2019-12-04 MED ORDER — SODIUM CHLORIDE 0.9% FLUSH
3.0000 mL | Freq: Once | INTRAVENOUS | Status: AC
  Administered 2019-12-05: 3 mL via INTRAVENOUS

## 2019-12-04 NOTE — ED Provider Notes (Signed)
[facility] DEPT Provider Note   CSN: [id number] Arrival date & time: 12/04/19  1947     History Chief Complaint  Patient presents with  . Abdominal Pain  . Hip Pain    Andrea Bryan is a 54 y.o. female with past medical history of diabetes, GERD, diverticulitis, hypertension, fibroids, who presents today for evaluation of 2 complaints. 1 left hip pain: She reports that 2 weeks ago she developed left lateral hip pain.  It hurts when she walks and bears weight however does not hurt if she is not weightbearing.  She denies any known injury.  She states she has been taking 1 g of Tylenol every 5-6 hours and that takes the edge off however does not relieve her pain.  She denies any back pain.  She denies any weakness, numbness, or tingling in the left leg.  She reports that when she walks she limps due to pain.   2: Abdominal pain she reports that she has had pain in the middle of her abdomen over the past week.  She states that it is intermittent and when it happens it feels like a spasming ball in the middle of her stomach.  She reports nausea without vomiting.  No diarrhea or constipation.  She denies any dysuria, increased frequency or urgency.  She denies any abnormal vaginal discharge or bleeding.  She states that this feels different than her previous episodes of diverticulitis and that it is in a different location.  Her pain is made worse with movement.     HPI     Past Medical History:  Diagnosis Date  . Diabetes mellitus without complication ([place])   . Hypertension     Patient Active Problem List   Diagnosis Date Noted  . Pulmonary embolism ([place]) 07/09/2019  . GERD (gastroesophageal reflux disease) 07/09/2019  . Migraine 07/09/2019  . Urge incontinence 07/09/2019  . Insomnia 07/09/2019  . Osteoarthritis 07/09/2019  . Essential hypertension 05/31/2019  . Hypothyroidism 05/31/2019  . Hypercholesteremia 05/31/2019  . Fibroid uterus 05/31/2019   . Diverticulitis 05/31/2019    Past Surgical History:  Procedure Laterality Date  . KNEE SURGERY       OB History    Gravida  7   Para  3   Term      Preterm  1   AB  4   Living        SAB  2   TAB  2   Ectopic      Multiple      Live Births              Family History  Problem Relation Age of Onset  . Diabetes Father   . Hypertension Father   . Hypertension Mother   . Diabetes Mother     Social History   Tobacco Use  . Smoking status: Never Smoker  . Smokeless tobacco: Never Used  Substance Use Topics  . Alcohol use: Not Currently  . Drug use: Not Currently    Home Medications Prior to Admission medications   Medication Sig Start Date End Date Taking? Authorizing Provider  acetaminophen (TYLENOL) 500 MG tablet Take 1,000 mg by mouth every 6 (six) hours as needed for mild pain.    Yes [provider]  atenolol (TENORMIN) 100 MG tablet Take 100 mg by mouth daily. 09/19/16  Yes [provider]  cyclobenzaprine (FLEXERIL) 10 MG tablet Take 10 mg by mouth in the morning and at bedtime.  Yes [provider]  ELIQUIS 5 MG TABS tablet Take 5 mg by mouth 2 (two) times daily. 03/03/19  Yes [provider]  FEROSUL 325 (65 Fe) MG tablet Take 325 mg by mouth daily with breakfast.  05/08/19  Yes [provider]  fluticasone (FLONASE) 50 MCG/ACT nasal spray Place 1 spray into both nostrils daily as needed for allergies.  09/19/16  Yes [provider]  JANUVIA 100 MG tablet Take 100 mg by mouth daily. 06/03/19  Yes [provider]  levothyroxine (SYNTHROID) 75 MCG tablet Take 75 mcg by mouth daily before breakfast. 09/19/16  Yes [provider]  lisinopril (ZESTRIL) 40 MG tablet Take 20 mg by mouth daily. 09/19/16  Yes [provider]  loratadine (CLARITIN) 10 MG tablet Take 10 mg by mouth daily.    Yes [provider]  omeprazole (PRILOSEC) 20 MG capsule Take 20 mg by mouth  daily. 02/19/18  Yes [provider]  oxybutynin (DITROPAN-XL) 10 MG 24 hr tablet Take 10 mg by mouth at bedtime.  05/08/19  Yes [provider]  risperidone (RISPERDAL) 4 MG tablet Take 4 mg by mouth at bedtime. 06/03/19  Yes [provider]  simvastatin (ZOCOR) 20 MG tablet Take 20 mg by mouth daily. 09/19/16  Yes [provider]  SUMAtriptan (IMITREX) 50 MG tablet Take 50 mg by mouth See admin instructions. Take 1 tab at onset of migraine may repeat in 2 hours no more than 4 in 2 hours. 06/03/19  Yes [provider]  Diclofenac Sodium 1.6 % GEL Place 2 g onto the skin every 6 (six) hours as needed (Pain). Patient not taking: Reported on 12/05/2019 07/05/19   McDonald, Mia A, PA-C  lidocaine (LIDODERM) 5 % Place 1 patch onto the skin daily. Remove & Discard patch within 12 hours or as directed by MD Patient not taking: Reported on 12/05/2019 07/05/19   McDonald, Mia A, PA-C    Allergies    Metformin, Atorvastatin, Oxycodone, and Tramadol  Review of Systems   Review of Systems  Constitutional: Negative for chills and fever.  HENT: Negative for congestion.   Respiratory: Negative for chest tightness and shortness of breath.   Cardiovascular: Negative for chest pain and palpitations.  Gastrointestinal: Positive for abdominal pain, nausea and vomiting.  Genitourinary: Negative for dysuria, frequency and urgency.  Musculoskeletal: Negative for back pain and neck pain.  Skin: Negative for color change and rash.  Neurological: Negative for weakness, numbness and headaches.  Psychiatric/Behavioral: Negative for confusion.  All other systems reviewed and are negative.   Physical Exam Updated Vital Signs BP (!) 151/76 (BP Location: Left Arm)   Pulse 80   Temp 98.4 F (36.9 C) (Oral)   Resp 16   SpO2 100%   Physical Exam Vitals and nursing note reviewed.  Constitutional:      Appearance: She is well-developed. She is obese.  HENT:     Head:  Normocephalic and atraumatic.  Eyes:     Conjunctiva/sclera: Conjunctivae normal.  Cardiovascular:     Rate and Rhythm: Normal rate and regular rhythm.     Heart sounds: No murmur.  Pulmonary:     Effort: Pulmonary effort is normal. No respiratory distress.     Breath sounds: Normal breath sounds.  Abdominal:     General: Bowel sounds are normal. There is no distension.     Palpations: Abdomen is soft.     Tenderness: There is generalized abdominal tenderness (Worse in RLQ).  Musculoskeletal:  Cervical back: Neck supple.     Comments: Pain in the left hip with weightbearing.  She reports that with the left knee flexed external rotation of the left hip improves her pain and feels like a "stretch" that feels good.  Skin:    General: Skin is warm and dry.     Comments: Superficial ecchymosis present over the left lateral leg.  Neurological:     General: No focal deficit present.     Mental Status: She is alert.     Cranial Nerves: No cranial nerve deficit.  Psychiatric:        Mood and Affect: Mood normal.        Behavior: Behavior normal.     ED Results / Procedures / Treatments   Labs (all labs ordered are listed, but only abnormal results are displayed) Labs Reviewed  COMPREHENSIVE METABOLIC PANEL - Abnormal; Notable for the following components:      Result Value   Glucose, Bld 117 (*)    BUN 25 (*)    Calcium 8.7 (*)    All other components within normal limits  CBC - Abnormal; Notable for the following components:   RBC 3.65 (*)    Hemoglobin 10.9 (*)    HCT 35.6 (*)    nRBC 0.3 (*)    All other components within normal limits  URINALYSIS, ROUTINE W REFLEX MICROSCOPIC - Abnormal; Notable for the following components:   Specific Gravity, Urine >1.046 (*)    All other components within normal limits  LIPASE, BLOOD  I-STAT BETA HCG BLOOD, ED ([facility], [facility], [facility] ONLY)    EKG None  Radiology CT Abdomen Pelvis W Contrast  Result Date: 12/05/2019 CLINICAL DATA:   Abdominal pain, left hip pain question of diverticulitis. EXAM: CT ABDOMEN AND PELVIS WITH CONTRAST TECHNIQUE: Multidetector CT imaging of the abdomen and pelvis was performed using the standard protocol following bolus administration of intravenous contrast. CONTRAST:  149mL OMNIPAQUE IOHEXOL 300 MG/ML  SOLN COMPARISON:  May 13, 2019 FINDINGS: Lower chest: The visualized heart size within normal limits. No pericardial fluid/thickening. No hiatal hernia. The visualized portions of the lungs are clear. Hepatobiliary: The liver is normal in density without focal abnormality.The main portal vein is patent. The patient is status post cholecystectomy. No biliary ductal dilation. Pancreas: Unremarkable. No pancreatic ductal dilatation or surrounding inflammatory changes. Spleen: Normal in size without focal abnormality. Adrenals/Urinary Tract: Both adrenal glands appear normal. The kidneys and collecting system appear normal without evidence of urinary tract calculus or hydronephrosis. Bladder is unremarkable. Stomach/Bowel: The stomach, small bowel, and colon are normal in appearance. Scattered colonic diverticula are noted without diverticulitis. The appendix is unremarkable. There is a moderate amount of colonic stool present. Vascular/Lymphatic: There are no enlarged mesenteric, retroperitoneal, or pelvic lymph nodes. Aortic Atherosclerosis (ICD10-I70.0). Reproductive: Heterogeneously enlarged uterine fibroids are seen at the posterior fundus. Other: Again noted is a spinal stimulator with the lead tips approximately at the lower thoracic spine on the lead wires coiled within a complex multi cystic fluid collection overlying the right abdominal wall. Musculoskeletal: Within the gluteal musculature is a there appears to be heterogeneous signal in overlying fat stranding changes. There is a tiny focal calcifications seen in the upper portion of the gluteal musculature, likely from prior injury. Overlying fat  stranding changes and a possible small hematoma or seen within the subcutaneous tissues. IMPRESSION: 1. Diverticulosis without diverticulitis. 2. Heterogeneous signal with fat stranding changes overlying the gluteal musculature which could be from muscular strain  or partial intramuscular tear. If further evaluation is required would recommend MRI of the pelvis. 3. Unchanged multi-cystic collection overlying the right abdominal wall with coiled lead tips mild likely seroma. 4. Aortic Atherosclerosis (ICD10-I70.0). 5. Unchanged uterine fibroids. Electronically Signed   By: Prudencio Pair M.D.   On: 12/05/2019 02:00    Procedures Procedures (including critical care time)  Medications Ordered in ED Medications  ondansetron (ZOFRAN-ODT) disintegrating tablet 4 mg (has no administration in time range)  sodium chloride (PF) 0.9 % injection (has no administration in time range)  sodium chloride flush (NS) 0.9 % injection 3 mL (3 mLs Intravenous Given 12/05/19 0042)  sodium chloride 0.9 % bolus 1,000 mL (0 mLs Intravenous Stopped 12/05/19 0338)  ondansetron (ZOFRAN) injection 4 mg (4 mg Intravenous Given 12/05/19 0042)  fentaNYL (SUBLIMAZE) injection 50 mcg (50 mcg Intravenous Given 12/05/19 0042)  iohexol (OMNIPAQUE) 300 MG/ML solution 100 mL (100 mLs Intravenous Contrast Given 12/05/19 0125)    ED Course  I have reviewed the triage vital signs and the nursing notes.  Pertinent labs & imaging results that were available during my care of the patient were reviewed by me and considered in my medical decision making (see chart for details).    MDM Rules/Calculators/A&P                     Virl Doakes presents today for evaluation of two complaints.  1.  Abdominal pain CBC shows mild anemia.  CMP and lipase without significant derangements.  UA shows evidence of dehydration with elevated specific gravity.  Pregnancy test is negative.  CT abdomen pelvis was obtained without cause for her symptoms found.   Recommended conservative care.  Do not suspect acute abdomen.  Left hip pain.  CT scan that was obtained to evaluate for abdominal pain does show concern for a muscle strain and contusion in the left-sided hip and buttock. Recommended conservative care including Tylenol.  Suspect MSK pain and muscle strain/tear.    Return precautions were discussed with patient who states their understanding.  At the time of discharge patient denied any unaddressed complaints or concerns.  Patient is agreeable for discharge home.  Note: Portions of this report may have been transcribed using voice recognition software. Every effort was made to ensure accuracy; however, inadvertent computerized transcription errors may be present  Final Clinical Impression(s) / ED Diagnoses Final diagnoses:  Generalized abdominal pain  Left hip pain    Rx / DC Orders ED Discharge Orders    None       Ollen Gross 12/05/19 [id number]    Fatima Blank, MD 12/07/19 [phone]

## 2019-12-04 NOTE — ED Notes (Signed)
Requested urine from patient. Patient has cup to urinate.

## 2019-12-04 NOTE — ED Triage Notes (Signed)
Patient c/o left hip pain x2 weeks. Reports pain worsens with movement. Denies injury. Patient also c/o generalized abdominal cramping with nausea x2 days. Denies V/D.

## 2019-12-05 ENCOUNTER — Encounter (HOSPITAL_COMMUNITY): Payer: Self-pay

## 2019-12-05 ENCOUNTER — Emergency Department (HOSPITAL_COMMUNITY): Payer: Medicare Other

## 2019-12-05 DIAGNOSIS — R1084 Generalized abdominal pain: Secondary | ICD-10-CM | POA: Diagnosis not present

## 2019-12-05 LAB — URINALYSIS, ROUTINE W REFLEX MICROSCOPIC
Bilirubin Urine: NEGATIVE
Glucose, UA: NEGATIVE mg/dL
Hgb urine dipstick: NEGATIVE
Ketones, ur: NEGATIVE mg/dL
Leukocytes,Ua: NEGATIVE
Nitrite: NEGATIVE
Protein, ur: NEGATIVE mg/dL
Specific Gravity, Urine: >1.046 — ABNORMAL HIGH (ref 1.005–1.030)
pH: 5 (ref 5.0–8.0)

## 2019-12-05 MED ORDER — ONDANSETRON HCL 4 MG/2ML IJ SOLN
4.0000 mg | Freq: Once | INTRAMUSCULAR | Status: AC
  Administered 2019-12-05: 4 mg via INTRAVENOUS
  Filled 2019-12-05: qty 2

## 2019-12-05 MED ORDER — SODIUM CHLORIDE (PF) 0.9 % IJ SOLN
INTRAMUSCULAR | Status: AC
  Filled 2019-12-05: qty 50

## 2019-12-05 MED ORDER — IOHEXOL 300 MG/ML  SOLN
100.0000 mL | Freq: Once | INTRAMUSCULAR | Status: AC | PRN
  Administered 2019-12-05: 100 mL via INTRAVENOUS

## 2019-12-05 MED ORDER — SODIUM CHLORIDE 0.9 % IV BOLUS
1000.0000 mL | Freq: Once | INTRAVENOUS | Status: AC
  Administered 2019-12-05: 01:00:00 1000 mL via INTRAVENOUS

## 2019-12-05 MED ORDER — FENTANYL CITRATE (PF) 100 MCG/2ML IJ SOLN
50.0000 ug | Freq: Once | INTRAMUSCULAR | Status: AC
  Administered 2019-12-05: 50 ug via INTRAVENOUS
  Filled 2019-12-05: qty 2

## 2019-12-05 NOTE — Discharge Instructions (Addendum)
Please take Tylenol (acetaminophen) to relieve your pain.  You may take tylenol, up to 1,000 mg (two extra strength pills) every 8 hours. .  Do not take more than 3,000 mg tylenol in a 24 hour period.  Please check all medication labels as many medications such as pain and cold medications may contain tylenol. Please do not drink alcohol while taking this medication.   Today your CT scan showed concern for a strain and you are gluteus muscle.  The gluteus muscle is the primary muscle in the buttock.  Please use Gas-X as needed for abdominal pain. Diclofenac gel, also known as Voltaren is now available over-the-counter and you can use this as needed.  Your urine today showed that you were dehydrated.  Please make sure you are drinking extra water.  Dehydration can lead to worsening muscle spasms.

## 2019-12-05 NOTE — ED Notes (Signed)
Pt lying in bed. o2 monitor on. Pt asking to speak with PA before D/C. Will continue to monitor.

## 2019-12-05 NOTE — ED Notes (Signed)
Pt sitting up in bed. Full monitor placed. Pt denies any needs. Will continue to monitor.

## 2020-05-27 ENCOUNTER — Other Ambulatory Visit: Payer: Self-pay | Admitting: Physician Assistant

## 2020-05-27 DIAGNOSIS — N6452 Nipple discharge: Secondary | ICD-10-CM

## 2020-06-16 ENCOUNTER — Other Ambulatory Visit: Payer: Medicare Other

## 2020-07-13 ENCOUNTER — Other Ambulatory Visit: Payer: Self-pay

## 2020-07-13 ENCOUNTER — Ambulatory Visit: Payer: Medicare Other

## 2020-07-13 ENCOUNTER — Ambulatory Visit
Admission: RE | Admit: 2020-07-13 | Discharge: 2020-07-13 | Disposition: A | Discharge status: Home / Self Care | Payer: Medicare Other | Source: Ambulatory Visit | Attending: Physician Assistant | Admitting: Physician Assistant

## 2020-07-13 DIAGNOSIS — N6452 Nipple discharge: Secondary | ICD-10-CM

## 2020-10-11 ENCOUNTER — Encounter (HOSPITAL_COMMUNITY): Payer: Self-pay

## 2020-10-11 DIAGNOSIS — E1169 Type 2 diabetes mellitus with other specified complication: Secondary | ICD-10-CM | POA: Insufficient documentation

## 2020-10-11 DIAGNOSIS — Z7901 Long term (current) use of anticoagulants: Secondary | ICD-10-CM | POA: Insufficient documentation

## 2020-10-11 DIAGNOSIS — E039 Hypothyroidism, unspecified: Secondary | ICD-10-CM | POA: Diagnosis not present

## 2020-10-11 DIAGNOSIS — I10 Essential (primary) hypertension: Secondary | ICD-10-CM | POA: Insufficient documentation

## 2020-10-11 DIAGNOSIS — K5900 Constipation, unspecified: Secondary | ICD-10-CM | POA: Insufficient documentation

## 2020-10-11 DIAGNOSIS — E7800 Pure hypercholesterolemia, unspecified: Secondary | ICD-10-CM | POA: Diagnosis not present

## 2020-10-11 DIAGNOSIS — Z79899 Other long term (current) drug therapy: Secondary | ICD-10-CM | POA: Insufficient documentation

## 2020-10-11 NOTE — ED Triage Notes (Signed)
Pt presents with c/o constipation. Pt reports she has been unable to have a bowel movement for 2 weeks despite taking laxatives. Pt reports pain in her abdomen and her rectum. Pt's abdomen is soft and non-distended.

## 2020-10-12 ENCOUNTER — Emergency Department (HOSPITAL_COMMUNITY)
Admission: EM | Admit: 2020-10-12 | Discharge: 2020-10-12 | Disposition: A | Discharge status: Home / Self Care | Payer: Medicare Other | Attending: Emergency Medicine | Admitting: Emergency Medicine

## 2020-10-12 DIAGNOSIS — K5900 Constipation, unspecified: Secondary | ICD-10-CM

## 2020-10-12 MED ORDER — POLYETHYLENE GLYCOL 3350 17 G PO PACK: 17.0000 g | PACK | Freq: Every day | ORAL | 0 refills | Status: AC

## 2020-10-12 NOTE — ED Notes (Signed)
ED Provider at bedside. 

## 2020-10-12 NOTE — ED Provider Notes (Signed)
[name] Note   CSN: [id number] Arrival date & time: 10/11/20  9509     History Chief Complaint  Patient presents with  . Constipation    Andrea Bryan is a 55 y.o. female.  55 yo F with a cc of with a chief complaints of constipation.  The patient feels like she needs to move her bowels but has been unable to.  She feels like something is in the way.  She denies nausea or vomiting and denies fevers.  Has spent most time on the toilet trying to strain.  She took a [employer] suppository without improvement.  The history is provided by the patient.  Constipation Severity:  Moderate Time since last bowel movement:  2 weeks Timing:  Constant Progression:  Worsening Chronicity:  New Stool description:  None produced Relieved by:  Nothing Worsened by:  Nothing Ineffective treatments:  None tried Associated symptoms: no abdominal pain, no dysuria, no fever, no nausea and no vomiting        Past Medical History:  Diagnosis Date  . Diabetes mellitus without complication ([place])   . Hypertension     Patient Active Problem List   Diagnosis Date Noted  . Pulmonary embolism ([place]) 07/09/2019  . GERD (gastroesophageal reflux disease) 07/09/2019  . Migraine 07/09/2019  . Urge incontinence 07/09/2019  . Insomnia 07/09/2019  . Osteoarthritis 07/09/2019  . Essential hypertension 05/31/2019  . Hypothyroidism 05/31/2019  . Hypercholesteremia 05/31/2019  . Fibroid uterus 05/31/2019  . Diverticulitis 05/31/2019    Past Surgical History:  Procedure Laterality Date  . KNEE SURGERY       OB History    Gravida  7   Para  3   Term      Preterm  1   AB  4   Living        SAB  2   IAB  2   Ectopic      Multiple      Live Births              Family History  Problem Relation Age of Onset  . Diabetes Father   . Hypertension Father   . Hypertension Mother   . Diabetes Mother     Social History   Tobacco Use   . Smoking status: Never Smoker  . Smokeless tobacco: Never Used  Vaping Use  . Vaping Use: Never used  Substance Use Topics  . Alcohol use: Not Currently  . Drug use: Not Currently    Home Medications Prior to Admission medications   Medication Sig Start Date End Date Taking? Authorizing Provider  polyethylene glycol (MIRALAX / GLYCOLAX) 17 g packet Take 17 g by mouth daily. 10/12/20  Yes Deno Etienne, DO  acetaminophen (TYLENOL) 500 MG tablet Take 1,000 mg by mouth every 6 (six) hours as needed for mild pain.     [provider]  atenolol (TENORMIN) 100 MG tablet Take 100 mg by mouth daily. 09/19/16   [provider]  cyclobenzaprine (FLEXERIL) 10 MG tablet Take 10 mg by mouth in the morning and at bedtime.    [provider]  Diclofenac Sodium 1.6 % GEL Place 2 g onto the skin every 6 (six) hours as needed (Pain). Patient not taking: Reported on 12/05/2019 07/05/19   McDonald, Mia A, PA-C  ELIQUIS 5 MG TABS tablet Take 5 mg by mouth 2 (two) times daily. 03/03/19   [provider]  FEROSUL 325 (65 Fe) MG tablet  Take 325 mg by mouth daily with breakfast.  05/08/19   [provider]  fluticasone (FLONASE) 50 MCG/ACT nasal spray Place 1 spray into both nostrils daily as needed for allergies.  09/19/16   [provider]  JANUVIA 100 MG tablet Take 100 mg by mouth daily. 06/03/19   [provider]  levothyroxine (SYNTHROID) 75 MCG tablet Take 75 mcg by mouth daily before breakfast. 09/19/16   [provider]  lidocaine (LIDODERM) 5 % Place 1 patch onto the skin daily. Remove & Discard patch within 12 hours or as directed by MD Patient not taking: Reported on 12/05/2019 07/05/19   McDonald, Mia A, PA-C  lisinopril (ZESTRIL) 40 MG tablet Take 20 mg by mouth daily. 09/19/16   [provider]  loratadine (CLARITIN) 10 MG tablet Take 10 mg by mouth daily.     [provider]  omeprazole (PRILOSEC) 20 MG capsule Take 20 mg  by mouth daily. 02/19/18   [provider]  oxybutynin (DITROPAN-XL) 10 MG 24 hr tablet Take 10 mg by mouth at bedtime.  05/08/19   [provider]  risperidone (RISPERDAL) 4 MG tablet Take 4 mg by mouth at bedtime. 06/03/19   [provider]  simvastatin (ZOCOR) 20 MG tablet Take 20 mg by mouth daily. 09/19/16   [provider]  SUMAtriptan (IMITREX) 50 MG tablet Take 50 mg by mouth See admin instructions. Take 1 tab at onset of migraine may repeat in 2 hours no more than 4 in 2 hours. 06/03/19   [provider]    Allergies    Metformin, Atorvastatin, Oxycodone, and Tramadol  Review of Systems   Review of Systems  Constitutional: Negative for chills and fever.  HENT: Negative for congestion and rhinorrhea.   Eyes: Negative for redness and visual disturbance.  Respiratory: Negative for shortness of breath and wheezing.   Cardiovascular: Negative for chest pain and palpitations.  Gastrointestinal: Positive for constipation. Negative for abdominal pain, nausea and vomiting.  Genitourinary: Negative for dysuria and urgency.  Musculoskeletal: Negative for arthralgias and myalgias.  Skin: Negative for pallor and wound.  Neurological: Negative for dizziness and headaches.    Physical Exam Updated Vital Signs BP (!) 176/87 (BP Location: Left Arm)   Pulse 68   Temp 99.2 F (37.3 C) (Oral)   Resp 18   SpO2 93%   Physical Exam Vitals and nursing note reviewed.  Constitutional:      General: She is not in acute distress.    Appearance: She is well-developed and well-nourished. She is not diaphoretic.  HENT:     Head: Normocephalic and atraumatic.  Eyes:     Extraocular Movements: EOM normal.     Pupils: Pupils are equal, round, and reactive to light.  Cardiovascular:     Rate and Rhythm: Normal rate and regular rhythm.     Heart sounds: No murmur heard. No friction rub. No gallop.   Pulmonary:     Effort: Pulmonary effort is normal.      Breath sounds: No wheezing or rales.  Abdominal:     General: There is no distension.     Palpations: Abdomen is soft.     Tenderness: There is no abdominal tenderness.  Musculoskeletal:        General: No tenderness or edema.     Cervical back: Normal range of motion and neck supple.  Skin:    General: Skin is warm and dry.  Neurological:     Mental Status:  She is alert and oriented to person, place, and time.  Psychiatric:        Mood and Affect: Mood and affect normal.        Behavior: Behavior normal.     ED Results / Procedures / Treatments   Labs (all labs ordered are listed, but only abnormal results are displayed) Labs Reviewed - No data to display  EKG None  Radiology No results found.  Procedures Procedures (including critical care time)  Medications Ordered in ED Medications - No data to display  ED Course  I have reviewed the triage vital signs and the nursing notes.  Pertinent labs & imaging results that were available during my care of the patient were reviewed by me and considered in my medical decision making (see chart for details).    MDM Rules/Calculators/A&P                          55 yo F with a chief complaints of constipation.  No good bowel movement in 2 weeks.  She is well-appearing and nontoxic.  Has no abdominal pain on exam.  No vomiting.  Unlikely to be a bowel obstruction.  Patient deferred rectal exam.  Will attempt to MiraLAX cleanout at home.  PCP follow-up.  5:06 AM:  I have discussed the diagnosis/risks/treatment options with the patient and believe the pt to be eligible for discharge home to follow-up with PCP. We also discussed returning to the ED immediately if new or worsening sx occur. We discussed the sx which are most concerning (e.g., sudden worsening pain, fever, inability to tolerate by mouth) that necessitate immediate return. Medications administered to the patient during their visit and any new prescriptions provided to  the patient are listed below.  Medications given during this visit Medications - No data to display   The patient appears reasonably screen and/or stabilized for discharge and I doubt any other medical condition or other [facility] requiring further screening, evaluation, or treatment in the ED at this time prior to discharge.   Final Clinical Impression(s) / ED Diagnoses Final diagnoses:  Constipation, unspecified constipation type    Rx / DC Orders ED Discharge Orders         Ordered    polyethylene glycol (MIRALAX / GLYCOLAX) 17 g packet  Daily        10/12/20 0503           Deno Etienne, DO 10/12/20 [phone]

## 2020-10-12 NOTE — Discharge Instructions (Addendum)
Take 8 scoops of miralax in 32oz of whatever you would like to drink.(Gatorade comes in this size) You can also use a fleets enema which you can buy over the counter at the pharmacy.  Return for worsening abdominal pain, vomiting or fever. ? ?

## 2020-12-10 ENCOUNTER — Ambulatory Visit (INDEPENDENT_AMBULATORY_CARE_PROVIDER_SITE_OTHER): Payer: Medicare Other | Admitting: Podiatry

## 2020-12-10 ENCOUNTER — Telehealth: Payer: Self-pay | Admitting: Podiatry

## 2020-12-10 ENCOUNTER — Other Ambulatory Visit: Payer: Self-pay

## 2020-12-10 ENCOUNTER — Ambulatory Visit (INDEPENDENT_AMBULATORY_CARE_PROVIDER_SITE_OTHER): Payer: Medicare Other

## 2020-12-10 ENCOUNTER — Encounter: Payer: Self-pay | Admitting: Podiatry

## 2020-12-10 DIAGNOSIS — E1140 Type 2 diabetes mellitus with diabetic neuropathy, unspecified: Secondary | ICD-10-CM | POA: Diagnosis not present

## 2020-12-10 DIAGNOSIS — E1149 Type 2 diabetes mellitus with other diabetic neurological complication: Secondary | ICD-10-CM

## 2020-12-10 DIAGNOSIS — L84 Corns and callosities: Secondary | ICD-10-CM | POA: Diagnosis not present

## 2020-12-10 NOTE — Telephone Encounter (Signed)
Patient stated she was told she would be prescribed antibiotic today, nothing in chart to reflect, Please Advise

## 2020-12-12 ENCOUNTER — Other Ambulatory Visit: Payer: Self-pay | Admitting: Podiatry

## 2020-12-12 MED ORDER — DOXYCYCLINE HYCLATE 100 MG PO TABS: 100.0000 mg | ORAL_TABLET | Freq: Two times a day (BID) | ORAL | 0 refills | Status: AC

## 2020-12-12 NOTE — Progress Notes (Signed)
Subjective:   Patient ID: Andrea Bryan, female   DOB: 55 y.o.   MRN: [id number]   HPI Patient presents stating she is got a chronic callus formation bottom of the right foot and was concerned about infection as she does have long-term diabetes.  Patient tries to stay in good health and take care of herself but she has developed neuropathic-like symptoms and patient does not smoke tries to be active   Review of Systems  All other systems reviewed and are negative.       Objective:  Physical Exam Vitals and nursing note reviewed.  Constitutional:      Appearance: She is well-developed.  Pulmonary:     Effort: Pulmonary effort is normal.  Musculoskeletal:        General: Normal range of motion.  Skin:    General: Skin is warm.  Neurological:     Mental Status: She is alert.     Neurovascular status intact with moderate diminishment of sharp dull vibratory muscle strength was found to be adequate range of motion found to be adequate.  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  Today went ahead and I debrided the area completely I did not note any break in skin but it is thick and I did have a small amount of bleeding as it was quite irritated in the center I flushed did not note any subcutaneous exposure or drainage or pus formation and I applied sterile dressing with padding around the area.  I then went ahead as precautionary measure and got a placed on doxycycline twice daily and I do want her to monitor it and if any redness swelling or proximal signs of any infection were to occur  patient is to reappoint immediately or go to the emergency room if any systemic signs of infection occur  X-ray it was negative currently for osteolysis or indications of bone infection

## 2020-12-12 NOTE — Telephone Encounter (Signed)
Sent in

## 2020-12-13 ENCOUNTER — Telehealth: Payer: Self-pay | Admitting: Podiatry

## 2020-12-13 NOTE — Telephone Encounter (Signed)
Went to [facility] on [address] yesterday

## 2020-12-13 NOTE — Telephone Encounter (Signed)
Please resend medication. Patient said her pharmacy did not receive it.

## 2021-01-06 ENCOUNTER — Ambulatory Visit: Payer: Medicare Other | Admitting: Podiatry

## 2021-01-07 ENCOUNTER — Other Ambulatory Visit: Payer: Self-pay

## 2021-01-07 ENCOUNTER — Emergency Department (HOSPITAL_COMMUNITY)
Admission: EM | Admit: 2021-01-07 | Discharge: 2021-01-07 | Disposition: A | Discharge status: Home / Self Care | Payer: Medicare Other | Attending: Emergency Medicine | Admitting: Emergency Medicine

## 2021-01-07 ENCOUNTER — Encounter (HOSPITAL_COMMUNITY): Payer: Self-pay

## 2021-01-07 DIAGNOSIS — R1084 Generalized abdominal pain: Secondary | ICD-10-CM

## 2021-01-07 DIAGNOSIS — E039 Hypothyroidism, unspecified: Secondary | ICD-10-CM | POA: Diagnosis not present

## 2021-01-07 DIAGNOSIS — K219 Gastro-esophageal reflux disease without esophagitis: Secondary | ICD-10-CM | POA: Diagnosis not present

## 2021-01-07 DIAGNOSIS — I10 Essential (primary) hypertension: Secondary | ICD-10-CM | POA: Diagnosis not present

## 2021-01-07 DIAGNOSIS — E119 Type 2 diabetes mellitus without complications: Secondary | ICD-10-CM | POA: Diagnosis not present

## 2021-01-07 DIAGNOSIS — Z7984 Long term (current) use of oral hypoglycemic drugs: Secondary | ICD-10-CM | POA: Diagnosis not present

## 2021-01-07 DIAGNOSIS — Z7901 Long term (current) use of anticoagulants: Secondary | ICD-10-CM | POA: Insufficient documentation

## 2021-01-07 DIAGNOSIS — Z79899 Other long term (current) drug therapy: Secondary | ICD-10-CM | POA: Insufficient documentation

## 2021-01-07 LAB — COMPREHENSIVE METABOLIC PANEL
ALT: 18 U/L (ref 0–44)
AST: 26 U/L (ref 15–41)
Albumin: 4.2 g/dL (ref 3.5–5.0)
Alkaline Phosphatase: 63 U/L (ref 38–126)
Anion gap: 8 (ref 5–15)
BUN: 31 mg/dL — ABNORMAL HIGH (ref 6–20)
CO2: 27 mmol/L (ref 22–32)
Calcium: 9.2 mg/dL (ref 8.9–10.3)
Chloride: 107 mmol/L (ref 98–111)
Creatinine, Ser: 0.92 mg/dL (ref 0.44–1.00)
GFR, Estimated: >60 mL/min (ref >60)
Glucose, Bld: 100 mg/dL — ABNORMAL HIGH (ref 70–99)
Potassium: 4.1 mmol/L (ref 3.5–5.1)
Sodium: 142 mmol/L (ref 135–145)
Total Bilirubin: 1 mg/dL (ref 0.3–1.2)
Total Protein: 7.2 g/dL (ref 6.5–8.1)

## 2021-01-07 LAB — CBC
HCT: 34.5 % — ABNORMAL LOW (ref 36.0–46.0)
Hemoglobin: 10.8 g/dL — ABNORMAL LOW (ref 12.0–15.0)
MCH: 30.2 pg (ref 26.0–34.0)
MCHC: 31.3 g/dL (ref 30.0–36.0)
MCV: 96.4 fL (ref 80.0–100.0)
Platelets: 207 10*3/uL (ref 150–400)
RBC: 3.58 MIL/uL — ABNORMAL LOW (ref 3.87–5.11)
RDW: 13.7 % (ref 11.5–15.5)
WBC: 7 10*3/uL (ref 4.0–10.5)
nRBC: 0 % (ref 0.0–0.2)

## 2021-01-07 LAB — LIPASE, BLOOD: Lipase: 25 U/L (ref 11–51)

## 2021-01-07 MED ORDER — DICYCLOMINE HCL 10 MG PO CAPS
10.0000 mg | ORAL_CAPSULE | Freq: Once | ORAL | Status: AC
  Administered 2021-01-07: 10 mg via ORAL
  Filled 2021-01-07: qty 1

## 2021-01-07 MED ORDER — DICYCLOMINE HCL 20 MG PO TABS: 20.0000 mg | ORAL_TABLET | Freq: Two times a day (BID) | ORAL | 0 refills | Status: AC | PRN

## 2021-01-07 NOTE — ED Triage Notes (Signed)
Pt to ED with c/o "upper and lower" abdominal cramping onset of 2 weeks ago. Pt appears to be talking to someone in triage, when asked who she is speaking to she states herself. Pt is asking herself questions as if having a conversation with another person, gets irritated when asked who she is speaking to. Pt states the cramping got worse today and that's why she is here. Arrives VSS, NADN, appears to be falling asleep throughout triage.

## 2021-01-07 NOTE — Discharge Instructions (Signed)
Your evaluation in the ED was reassuring.  We recommend follow-up with your primary care doctor.

## 2021-01-07 NOTE — ED Notes (Addendum)
Pt taken to room 23 by wheelchair. Pt states she will get in the bed but will not get up. Pt is talking to herself "saying I'm getting pissed off and going to whoop yall's ass" When asked who she is talking to she says nobody

## 2021-01-07 NOTE — ED Provider Notes (Signed)
[facility] DEPT Provider Note   CSN: [id number] Arrival date & time: 01/07/21  0038     History Chief Complaint  Patient presents with  . Abdominal Pain    Andrea Bryan is a 55 y.o. female.  The history is provided by the patient. No language interpreter was used.  Abdominal Pain Pain location:  Generalized Pain quality: cramping   Pain radiates to:  Does not radiate Onset quality:  Gradual Duration:  2 weeks Timing:  Constant Progression:  Worsening Chronicity:  New Relieved by:  Nothing Ineffective treatments:  None tried Associated symptoms: no diarrhea, no dysuria, no fever, no hematochezia, no melena, no nausea and no vomiting   Risk factors: obesity       Past Medical History:  Diagnosis Date  . Diabetes mellitus without complication ([place])   . Hypertension     Patient Active Problem List   Diagnosis Date Noted  . Pulmonary embolism ([place]) 07/09/2019  . GERD (gastroesophageal reflux disease) 07/09/2019  . Migraine 07/09/2019  . Urge incontinence 07/09/2019  . Insomnia 07/09/2019  . Osteoarthritis 07/09/2019  . Essential hypertension 05/31/2019  . Hypothyroidism 05/31/2019  . Hypercholesteremia 05/31/2019  . Fibroid uterus 05/31/2019  . Diverticulitis 05/31/2019    Past Surgical History:  Procedure Laterality Date  . KNEE SURGERY       OB History    Gravida  7   Para  3   Term      Preterm  1   AB  4   Living        SAB  2   IAB  2   Ectopic      Multiple      Live Births              Family History  Problem Relation Age of Onset  . Diabetes Father   . Hypertension Father   . Hypertension Mother   . Diabetes Mother     Social History   Tobacco Use  . Smoking status: Never Smoker  . Smokeless tobacco: Never Used  Vaping Use  . Vaping Use: Never used  Substance Use Topics  . Alcohol use: Not Currently  . Drug use: Not Currently    Home Medications Prior to Admission medications    Medication Sig Start Date End Date Taking? Authorizing Provider  acetaminophen (TYLENOL) 500 MG tablet Take 1,000 mg by mouth every 6 (six) hours as needed for mild pain.    Yes [provider]  atenolol (TENORMIN) 100 MG tablet Take 100 mg by mouth daily. 09/19/16  Yes [provider]  cyclobenzaprine (FLEXERIL) 10 MG tablet Take 10 mg by mouth in the morning and at bedtime.   Yes [provider]  dicyclomine (BENTYL) 20 MG tablet Take 1 tablet (20 mg total) by mouth every 12 (twelve) hours as needed (for abdominal pain/cramping). 01/07/21  Yes Antonietta Breach, PA-C  ELIQUIS 5 MG TABS tablet Take 5 mg by mouth 2 (two) times daily. 03/03/19  Yes [provider]  FEROSUL 325 (65 Fe) MG tablet Take 325 mg by mouth daily with breakfast.  05/08/19  Yes [provider]  fluticasone (FLONASE) 50 MCG/ACT nasal spray Place 1 spray into both nostrils daily as needed for allergies.  09/19/16  Yes [provider]  JANUVIA 100 MG tablet Take 100 mg by mouth daily. 06/03/19  Yes [provider]  Levothyroxine Sodium 112 MCG CAPS Take 112 mcg by mouth daily before breakfast. 09/19/16  Yes [provider]  lisinopril (ZESTRIL) 20 MG tablet Take 20 mg by mouth daily. 09/19/16  Yes [provider]  loratadine (CLARITIN) 10 MG tablet Take 10 mg by mouth daily.   Yes [provider]  omeprazole (PRILOSEC) 20 MG capsule Take 20 mg by mouth daily. 02/19/18  Yes [provider]  oxybutynin (DITROPAN-XL) 10 MG 24 hr tablet Take 10 mg by mouth at bedtime.  05/08/19  Yes [provider]  simvastatin (ZOCOR) 20 MG tablet Take 20 mg by mouth daily. 09/19/16  Yes [provider]  SUMAtriptan (IMITREX) 50 MG tablet Take 50 mg by mouth See admin instructions. Take 1 tab at onset of migraine may repeat in 2 hours no more than 4 in 2 hours. 06/03/19  Yes [provider]  Diclofenac Sodium 1.6 % GEL Place 2 g onto the skin  every 6 (six) hours as needed (Pain). Patient not taking: No sig reported 07/05/19   McDonald, Mia A, PA-C  doxycycline (VIBRA-TABS) 100 MG tablet Take 1 tablet (100 mg total) by mouth 2 (two) times daily. Patient not taking: Reported on 01/07/2021 12/12/20   Wallene Huh, DPM  lidocaine (LIDODERM) 5 % Place 1 patch onto the skin daily. Remove & Discard patch within 12 hours or as directed by MD Patient not taking: No sig reported 07/05/19   McDonald, Mia A, PA-C  polyethylene glycol (MIRALAX / GLYCOLAX) 17 g packet Take 17 g by mouth daily. Patient not taking: Reported on 01/07/2021 10/12/20   Deno Etienne, DO  risperidone (RISPERDAL) 4 MG tablet Take 4 mg by mouth at bedtime. Patient not taking: Reported on 01/07/2021 06/03/19   [provider]    Allergies    Metformin, Atorvastatin, Oxycodone, and Tramadol  Review of Systems   Review of Systems  Constitutional: Negative for fever.  Gastrointestinal: Positive for abdominal pain. Negative for diarrhea, hematochezia, melena, nausea and vomiting.  Genitourinary: Negative for dysuria.  Ten systems reviewed and are negative for acute change, except as noted in the HPI.    Physical Exam Updated Vital Signs BP (!) 158/89 (BP Location: Right Arm)   Pulse 72   Temp 98.4 F (36.9 C) (Oral)   Resp (!) 23   Ht 5\' 6"  (1.676 m)   Wt 115.4 kg   SpO2 100%   BMI 41.06 kg/m   Physical Exam Vitals and nursing note reviewed.  Constitutional:      General: She is not in acute distress.    Appearance: She is well-developed. She is not diaphoretic.     Comments: Nontoxic appearing and in NAD  HENT:     Head: Normocephalic and atraumatic.  Eyes:     General: No scleral icterus.    Conjunctiva/sclera: Conjunctivae normal.  Cardiovascular:     Rate and Rhythm: Normal rate and regular rhythm.     Pulses: Normal pulses.  Pulmonary:     Effort: Pulmonary effort is normal. No respiratory distress.     Breath sounds: No stridor. No wheezing or  rales.     Comments: Respirations even and unlabored. Lungs CTAB. Abdominal:     Comments: Soft, obese, nontender abdomen  Musculoskeletal:        General: Normal range of motion.     Cervical back: Normal range of motion.  Skin:    General: Skin is warm and dry.     Coloration: Skin is not pale.     Findings: No erythema or rash.  Neurological:  Mental Status: She is alert and oriented to person, place, and time.     Coordination: Coordination normal.  Psychiatric:        Behavior: Behavior normal.     ED Results / Procedures / Treatments   Labs (all labs ordered are listed, but only abnormal results are displayed) Labs Reviewed  COMPREHENSIVE METABOLIC PANEL - Abnormal; Notable for the following components:      Result Value   Glucose, Bld 100 (*)    BUN 31 (*)    All other components within normal limits  CBC - Abnormal; Notable for the following components:   RBC 3.58 (*)    Hemoglobin 10.8 (*)    HCT 34.5 (*)    All other components within normal limits  LIPASE, BLOOD    EKG None  Radiology No results found.  Procedures Procedures   Medications Ordered in ED Medications  dicyclomine (BENTYL) capsule 10 mg (10 mg Oral Given 01/07/21 0321)    ED Course  I have reviewed the triage vital signs and the nursing notes.  Pertinent labs & imaging results that were available during my care of the patient were reviewed by me and considered in my medical decision making (see chart for details).    MDM Rules/Calculators/A&P                          55 year old female presents to the emergency department for generalized abdominal cramping for 2 weeks.  History is somewhat limited due to patient cooperation, but she denies vomiting, diarrhea, urinary symptoms, fever.  Has had symptomatic improvement in the ED with Bentyl.  No tenderness to palpation on abdominal exam.  This is stable on repeat assessment.  Labs completed and reviewed.  No leukocytosis, electrolyte  derangements.  Liver and kidney function preserved.  Lipase normal.  Given chronicity of symptoms, reassuring abdominal exam, low suspicion for emergent etiology.  Have encouraged the patient to follow-up with her primary doctor.  Will prescribe short course of Bentyl for use in the interim.  Patient discharged in stable condition with no unaddressed concerns.   Final Clinical Impression(s) / ED Diagnoses Final diagnoses:  Generalized abdominal pain    Rx / DC Orders ED Discharge Orders         Ordered    dicyclomine (BENTYL) 20 MG tablet  Every 12 hours PRN        01/07/21 0552           Antonietta Breach, PA-C 96/29/52 8413    Delora Fuel, MD 24/40/10 [phone]

## 2021-01-26 ENCOUNTER — Encounter (HOSPITAL_COMMUNITY): Payer: Self-pay

## 2021-01-26 ENCOUNTER — Emergency Department (HOSPITAL_COMMUNITY): Payer: Medicare Other

## 2021-01-26 ENCOUNTER — Other Ambulatory Visit: Payer: Self-pay

## 2021-01-26 ENCOUNTER — Emergency Department (HOSPITAL_COMMUNITY)
Admission: EM | Admit: 2021-01-26 | Discharge: 2021-01-27 | Disposition: A | Discharge status: Home / Self Care | Payer: Medicare Other | Attending: Emergency Medicine | Admitting: Emergency Medicine

## 2021-01-26 DIAGNOSIS — R079 Chest pain, unspecified: Secondary | ICD-10-CM | POA: Diagnosis not present

## 2021-01-26 DIAGNOSIS — Z7901 Long term (current) use of anticoagulants: Secondary | ICD-10-CM | POA: Insufficient documentation

## 2021-01-26 DIAGNOSIS — R600 Localized edema: Secondary | ICD-10-CM | POA: Insufficient documentation

## 2021-01-26 DIAGNOSIS — R2243 Localized swelling, mass and lump, lower limb, bilateral: Secondary | ICD-10-CM | POA: Diagnosis not present

## 2021-01-26 DIAGNOSIS — E119 Type 2 diabetes mellitus without complications: Secondary | ICD-10-CM | POA: Diagnosis not present

## 2021-01-26 DIAGNOSIS — Z79899 Other long term (current) drug therapy: Secondary | ICD-10-CM | POA: Insufficient documentation

## 2021-01-26 DIAGNOSIS — I10 Essential (primary) hypertension: Secondary | ICD-10-CM | POA: Diagnosis not present

## 2021-01-26 DIAGNOSIS — E039 Hypothyroidism, unspecified: Secondary | ICD-10-CM | POA: Insufficient documentation

## 2021-01-26 DIAGNOSIS — Z7984 Long term (current) use of oral hypoglycemic drugs: Secondary | ICD-10-CM | POA: Diagnosis not present

## 2021-01-26 LAB — COMPREHENSIVE METABOLIC PANEL
ALT: 16 U/L (ref 0–44)
AST: 20 U/L (ref 15–41)
Albumin: 4.3 g/dL (ref 3.5–5.0)
Alkaline Phosphatase: 85 U/L (ref 38–126)
Anion gap: 8 (ref 5–15)
BUN: 19 mg/dL (ref 6–20)
CO2: 27 mmol/L (ref 22–32)
Calcium: 9.2 mg/dL (ref 8.9–10.3)
Chloride: 103 mmol/L (ref 98–111)
Creatinine, Ser: 0.81 mg/dL (ref 0.44–1.00)
GFR, Estimated: >60 mL/min (ref >60)
Glucose, Bld: 119 mg/dL — ABNORMAL HIGH (ref 70–99)
Potassium: 4 mmol/L (ref 3.5–5.1)
Sodium: 138 mmol/L (ref 135–145)
Total Bilirubin: 0.4 mg/dL (ref 0.3–1.2)
Total Protein: 7.5 g/dL (ref 6.5–8.1)

## 2021-01-26 LAB — CBC WITH DIFFERENTIAL/PLATELET
Abs Immature Granulocytes: 0.01 10*3/uL (ref 0.00–0.07)
Basophils Absolute: 0 10*3/uL (ref 0.0–0.1)
Basophils Relative: 0 %
Eosinophils Absolute: 0.1 10*3/uL (ref 0.0–0.5)
Eosinophils Relative: 1 %
HCT: 36.3 % (ref 36.0–46.0)
Hemoglobin: 11.2 g/dL — ABNORMAL LOW (ref 12.0–15.0)
Immature Granulocytes: 0 %
Lymphocytes Relative: 27 %
Lymphs Abs: 2.5 10*3/uL (ref 0.7–4.0)
MCH: 29.7 pg (ref 26.0–34.0)
MCHC: 30.9 g/dL (ref 30.0–36.0)
MCV: 96.3 fL (ref 80.0–100.0)
Monocytes Absolute: 0.8 10*3/uL (ref 0.1–1.0)
Monocytes Relative: 9 %
Neutro Abs: 5.5 10*3/uL (ref 1.7–7.7)
Neutrophils Relative %: 63 %
Platelets: 253 10*3/uL (ref 150–400)
RBC: 3.77 MIL/uL — ABNORMAL LOW (ref 3.87–5.11)
RDW: 13.4 % (ref 11.5–15.5)
WBC: 9 10*3/uL (ref 4.0–10.5)
nRBC: 0 % (ref 0.0–0.2)

## 2021-01-26 LAB — BRAIN NATRIURETIC PEPTIDE: B Natriuretic Peptide: 37.4 pg/mL (ref 0.0–100.0)

## 2021-01-26 LAB — TROPONIN I (HIGH SENSITIVITY): Troponin I (High Sensitivity): 9 ng/L (ref <18)
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
## 2021-01-27 NOTE — ED Notes (Signed)
Pt did not sign d/c but was given d/c papers and walked out of the ED.

## 2021-01-27 NOTE — Discharge Instructions (Addendum)
Follow up with your doctor for recheck if symptoms recur.

## 2021-03-01 ENCOUNTER — Encounter (HOSPITAL_COMMUNITY): Payer: Self-pay | Admitting: Emergency Medicine

## 2021-03-01 ENCOUNTER — Other Ambulatory Visit: Payer: Self-pay

## 2021-03-01 ENCOUNTER — Emergency Department (HOSPITAL_COMMUNITY)
Admission: EM | Admit: 2021-03-01 | Discharge: 2021-03-02 | Disposition: A | Discharge status: Home / Self Care | Payer: Medicare Other | Attending: Emergency Medicine | Admitting: Emergency Medicine

## 2021-03-01 DIAGNOSIS — E039 Hypothyroidism, unspecified: Secondary | ICD-10-CM | POA: Diagnosis not present

## 2021-03-01 DIAGNOSIS — Z79899 Other long term (current) drug therapy: Secondary | ICD-10-CM | POA: Diagnosis not present

## 2021-03-01 DIAGNOSIS — I10 Essential (primary) hypertension: Secondary | ICD-10-CM | POA: Diagnosis not present

## 2021-03-01 DIAGNOSIS — Z7901 Long term (current) use of anticoagulants: Secondary | ICD-10-CM | POA: Diagnosis not present

## 2021-03-01 DIAGNOSIS — T7421XA Adult sexual abuse, confirmed, initial encounter: Secondary | ICD-10-CM | POA: Diagnosis not present

## 2021-03-01 DIAGNOSIS — E119 Type 2 diabetes mellitus without complications: Secondary | ICD-10-CM | POA: Insufficient documentation

## 2021-03-01 DIAGNOSIS — R102 Pelvic and perineal pain: Secondary | ICD-10-CM

## 2021-03-01 DIAGNOSIS — R1084 Generalized abdominal pain: Secondary | ICD-10-CM
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
## 2021-03-02 ENCOUNTER — Emergency Department (HOSPITAL_COMMUNITY): Payer: Medicare Other

## 2021-03-02 DIAGNOSIS — T7421XA Adult sexual abuse, confirmed, initial encounter: Secondary | ICD-10-CM | POA: Diagnosis not present

## 2021-03-02 NOTE — Discharge Instructions (Addendum)
Take ibuprofen 600 mg every 6 hours as needed for pain.  Follow-up with your primary doctor if symptoms are not improving in the next few days.
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
## 2021-03-10 ENCOUNTER — Other Ambulatory Visit: Payer: Self-pay | Admitting: Family Medicine

## 2021-03-10 DIAGNOSIS — R5381 Other malaise: Secondary | ICD-10-CM

## 2021-03-28 ENCOUNTER — Other Ambulatory Visit: Payer: Self-pay | Admitting: Family Medicine

## 2021-03-28 DIAGNOSIS — Z1231 Encounter for screening mammogram for malignant neoplasm of breast: Secondary | ICD-10-CM

## 2021-03-30 ENCOUNTER — Ambulatory Visit (INDEPENDENT_AMBULATORY_CARE_PROVIDER_SITE_OTHER): Payer: Medicare Other

## 2021-03-30 ENCOUNTER — Ambulatory Visit (INDEPENDENT_AMBULATORY_CARE_PROVIDER_SITE_OTHER): Payer: Medicare Other | Admitting: Podiatry

## 2021-03-30 ENCOUNTER — Encounter: Payer: Self-pay | Admitting: Podiatry

## 2021-03-30 ENCOUNTER — Other Ambulatory Visit: Payer: Self-pay

## 2021-03-30 DIAGNOSIS — M722 Plantar fascial fibromatosis: Secondary | ICD-10-CM

## 2021-03-30 DIAGNOSIS — E1149 Type 2 diabetes mellitus with other diabetic neurological complication: Secondary | ICD-10-CM

## 2021-03-30 DIAGNOSIS — E1140 Type 2 diabetes mellitus with diabetic neuropathy, unspecified: Secondary | ICD-10-CM

## 2021-03-30 MED ORDER — DICLOFENAC SODIUM 75 MG PO TBEC: 75.0000 mg | DELAYED_RELEASE_TABLET | Freq: Two times a day (BID) | ORAL | 2 refills | Status: DC

## 2021-03-30 MED ORDER — TRIAMCINOLONE ACETONIDE 10 MG/ML IJ SUSP
10.0000 mg | Freq: Once | INTRAMUSCULAR | Status: AC
  Administered 2021-03-30: 10 mg

## 2021-03-30 NOTE — Addendum Note (Signed)
Addended by: Wallene Huh on: 03/30/2021 02:05 PM   Modules accepted: Orders

## 2021-03-30 NOTE — Progress Notes (Signed)
Subjective:   Patient ID: Andrea Bryan, female   DOB: 55 y.o.   MRN: [id number]   HPI Patient presents stating she developed a lot of pain in the bottom of her right heel over the last month.  Does not remember specific injury states its achy and hurts when she tries to stand on it   ROS      Objective:  Physical Exam  Neurovascular status intact no other changes in health history exquisite discomfort plantar aspect right heel insertional point tendon calcaneus     Assessment:  Acute Planter fasciitis right inflammation fluid buildup H&P     Plan:  We reviewed condition sterile prep injected the plantar fascial right 3 mg Kenalog 5 mg Xylocaine applied fascial brace with instructions on usage along with supportive therapy and reappoint to recheck  X-rays indicate significant spur no indication stress fracture arthritis

## 2021-03-30 NOTE — Addendum Note (Signed)
Addended by: Joaquim Lai on: 03/30/2021 02:03 PM   Modules accepted: Orders

## 2021-05-24 ENCOUNTER — Ambulatory Visit: Payer: Medicare Other

## 2021-05-26 ENCOUNTER — Emergency Department (HOSPITAL_COMMUNITY)
Admission: EM | Admit: 2021-05-26 | Discharge: 2021-05-26 | Disposition: A | Discharge status: Home / Self Care | Payer: Medicare Other | Attending: Emergency Medicine | Admitting: Emergency Medicine

## 2021-05-26 ENCOUNTER — Other Ambulatory Visit: Payer: Self-pay

## 2021-05-26 ENCOUNTER — Emergency Department (HOSPITAL_COMMUNITY): Payer: Medicare Other

## 2021-05-26 ENCOUNTER — Encounter (HOSPITAL_COMMUNITY): Payer: Self-pay | Admitting: *Deleted

## 2021-05-26 DIAGNOSIS — Z79899 Other long term (current) drug therapy: Secondary | ICD-10-CM | POA: Diagnosis not present

## 2021-05-26 DIAGNOSIS — Z7901 Long term (current) use of anticoagulants: Secondary | ICD-10-CM | POA: Diagnosis not present

## 2021-05-26 DIAGNOSIS — E039 Hypothyroidism, unspecified: Secondary | ICD-10-CM | POA: Diagnosis not present

## 2021-05-26 DIAGNOSIS — I10 Essential (primary) hypertension: Secondary | ICD-10-CM | POA: Insufficient documentation

## 2021-05-26 DIAGNOSIS — B356 Tinea cruris: Secondary | ICD-10-CM | POA: Diagnosis not present

## 2021-05-26 DIAGNOSIS — D649 Anemia, unspecified: Secondary | ICD-10-CM | POA: Diagnosis not present

## 2021-05-26 DIAGNOSIS — E119 Type 2 diabetes mellitus without complications: Secondary | ICD-10-CM | POA: Diagnosis not present

## 2021-05-26 DIAGNOSIS — K6289 Other specified diseases of anus and rectum: Secondary | ICD-10-CM | POA: Diagnosis present

## 2021-05-26 LAB — CBC WITH DIFFERENTIAL/PLATELET
Abs Immature Granulocytes: 0.02 10*3/uL (ref 0.00–0.07)
Basophils Absolute: 0 10*3/uL (ref 0.0–0.1)
Basophils Relative: 0 %
Eosinophils Absolute: 0.1 10*3/uL (ref 0.0–0.5)
Eosinophils Relative: 1 %
HCT: 34.4 % — ABNORMAL LOW (ref 36.0–46.0)
Hemoglobin: 10.8 g/dL — ABNORMAL LOW (ref 12.0–15.0)
Immature Granulocytes: 0 %
Lymphocytes Relative: 31 %
Lymphs Abs: 2.6 10*3/uL (ref 0.7–4.0)
MCH: 29.2 pg (ref 26.0–34.0)
MCHC: 31.4 g/dL (ref 30.0–36.0)
MCV: 93 fL (ref 80.0–100.0)
Monocytes Absolute: 0.7 10*3/uL (ref 0.1–1.0)
Monocytes Relative: 9 %
Neutro Abs: 4.7 10*3/uL (ref 1.7–7.7)
Neutrophils Relative %: 59 %
Platelets: 253 10*3/uL (ref 150–400)
RBC: 3.7 MIL/uL — ABNORMAL LOW (ref 3.87–5.11)
RDW: 14.2 % (ref 11.5–15.5)
WBC: 8.2 10*3/uL (ref 4.0–10.5)
nRBC: 0 % (ref 0.0–0.2)

## 2021-05-26 LAB — COMPREHENSIVE METABOLIC PANEL
ALT: 18 U/L (ref 0–44)
AST: 17 U/L (ref 15–41)
Albumin: 4.5 g/dL (ref 3.5–5.0)
Alkaline Phosphatase: 73 U/L (ref 38–126)
Anion gap: 7 (ref 5–15)
BUN: 22 mg/dL — ABNORMAL HIGH (ref 6–20)
CO2: 28 mmol/L (ref 22–32)
Calcium: 9.7 mg/dL (ref 8.9–10.3)
Chloride: 106 mmol/L (ref 98–111)
Creatinine, Ser: 0.85 mg/dL (ref 0.44–1.00)
GFR, Estimated: >60 mL/min (ref >60)
Glucose, Bld: 92 mg/dL (ref 70–99)
Potassium: 4.1 mmol/L (ref 3.5–5.1)
Sodium: 141 mmol/L (ref 135–145)
Total Bilirubin: 0.8 mg/dL (ref 0.3–1.2)
Total Protein: 7.5 g/dL (ref 6.5–8.1)

## 2021-05-26 LAB — POC OCCULT BLOOD, ED: Fecal Occult Bld: NEGATIVE

## 2021-05-26 MED ORDER — CLOTRIMAZOLE 1 % EX CREA: TOPICAL_CREAM | CUTANEOUS | 0 refills | Status: AC

## 2021-05-26 MED ORDER — IOHEXOL 350 MG/ML SOLN
80.0000 mL | Freq: Once | INTRAVENOUS | Status: AC | PRN
  Administered 2021-05-26: 80 mL via INTRAVENOUS

## 2021-05-26 NOTE — ED Provider Notes (Signed)
[facility] DEPT Provider Note   CSN: [id number] Arrival date & time: 05/26/21  [id number]     History Chief Complaint  Patient presents with   Rectal Pain    Andrea Bryan is a 55 y.o. female who presents with concern for 4 days of anal pain and irritation.  Denies any constipation or diarrhea, melena or hematochezia nausea, vomiting, fevers, chills.  I personally reads patient medical records.  She is history of hypertension, hypothyroidism, hypercholesterolemia, GERD, diverticulitis, and PE.  She is anticoagulated on Eliquis.  HPI     Past Medical History:  Diagnosis Date   Diabetes mellitus without complication ([place])    Hypertension     Patient Active Problem List   Diagnosis Date Noted   Pulmonary embolism ([place]) 07/09/2019   GERD (gastroesophageal reflux disease) 07/09/2019   Migraine 07/09/2019   Urge incontinence 07/09/2019   Insomnia 07/09/2019   Osteoarthritis 07/09/2019   Essential hypertension 05/31/2019   Hypothyroidism 05/31/2019   Hypercholesteremia 05/31/2019   Fibroid uterus 05/31/2019   Diverticulitis 05/31/2019    Past Surgical History:  Procedure Laterality Date   KNEE SURGERY       OB History     Gravida  7   Para  3   Term      Preterm  1   AB  4   Living         SAB  2   IAB  2   Ectopic      Multiple      Live Births              Family History  Problem Relation Age of Onset   Diabetes Father    Hypertension Father    Hypertension Mother    Diabetes Mother     Social History   Tobacco Use   Smoking status: Never   Smokeless tobacco: Never  Vaping Use   Vaping Use: Never used  Substance Use Topics   Alcohol use: Not Currently   Drug use: Not Currently    Home Medications Prior to Admission medications   Medication Sig Start Date End Date Taking? Authorizing Provider  clotrimazole (LOTRIMIN) 1 % cream Apply to affected (gluteal area) area 2 times daily 05/26/21  Yes  Mckaela Howley R, PA-C  acetaminophen (TYLENOL) 500 MG tablet Take 1,000 mg by mouth every 6 (six) hours as needed for mild pain.     [provider]  atenolol (TENORMIN) 100 MG tablet Take 100 mg by mouth daily. 09/19/16   [provider]  cyclobenzaprine (FLEXERIL) 10 MG tablet Take 10 mg by mouth in the morning and at bedtime.    [provider]  Diclofenac Sodium 1.6 % GEL Place 2 g onto the skin every 6 (six) hours as needed (Pain). Patient not taking: No sig reported 07/05/19   McDonald, Mia A, PA-C  dicyclomine (BENTYL) 20 MG tablet Take 1 tablet (20 mg total) by mouth every 12 (twelve) hours as needed (for abdominal pain/cramping). 01/07/21   Antonietta Breach, PA-C  doxycycline (VIBRA-TABS) 100 MG tablet Take 1 tablet (100 mg total) by mouth 2 (two) times daily. Patient not taking: Reported on 01/07/2021 12/12/20   Wallene Huh, DPM  ELIQUIS 5 MG TABS tablet Take 5 mg by mouth 2 (two) times daily. 03/03/19   [provider]  FEROSUL 325 (65 Fe) MG tablet Take 325 mg by mouth daily with breakfast.  05/08/19   [provider]  fluticasone Asencion Islam)  50 MCG/ACT nasal spray Place 1 spray into both nostrils daily as needed for allergies.  09/19/16   [provider]  JANUVIA 100 MG tablet Take 100 mg by mouth daily. 06/03/19   [provider]  Levothyroxine Sodium 112 MCG CAPS Take 112 mcg by mouth daily before breakfast. 09/19/16   [provider]  lidocaine (LIDODERM) 5 % Place 1 patch onto the skin daily. Remove & Discard patch within 12 hours or as directed by MD Patient not taking: No sig reported 07/05/19   McDonald, Mia A, PA-C  lisinopril (ZESTRIL) 20 MG tablet Take 20 mg by mouth daily. 09/19/16   [provider]  loratadine (CLARITIN) 10 MG tablet Take 10 mg by mouth daily.    [provider]  omeprazole (PRILOSEC) 20 MG capsule Take 20 mg by mouth daily. 02/19/18   [provider]  oxybutynin  (DITROPAN-XL) 10 MG 24 hr tablet Take 10 mg by mouth at bedtime.  05/08/19   [provider]  polyethylene glycol (MIRALAX / GLYCOLAX) 17 g packet Take 17 g by mouth daily. Patient not taking: Reported on 01/07/2021 10/12/20   Deno Etienne, DO  risperidone (RISPERDAL) 4 MG tablet Take 4 mg by mouth at bedtime. Patient not taking: Reported on 01/07/2021 06/03/19   [provider]  simvastatin (ZOCOR) 20 MG tablet Take 20 mg by mouth daily. 09/19/16   [provider]  SUMAtriptan (IMITREX) 50 MG tablet Take 50 mg by mouth See admin instructions. Take 1 tab at onset of migraine may repeat in 2 hours no more than 4 in 2 hours. 06/03/19   [provider]    Allergies    Metformin, Atorvastatin, Oxycodone, and Tramadol  Review of Systems   Review of Systems  Constitutional: Negative.   HENT: Negative.    Respiratory: Negative.    Cardiovascular: Negative.   Gastrointestinal:  Positive for rectal pain. Negative for abdominal distention, abdominal pain, anal bleeding, blood in stool, constipation, diarrhea, nausea and vomiting.  Genitourinary: Negative.   Musculoskeletal: Negative.   Skin: Negative.   Neurological: Negative.   Hematological: Negative.    Physical Exam Updated Vital Signs BP (!) 181/95 (BP Location: Right Arm)   Pulse 70   Temp 98.2 F (36.8 C) (Oral)   Resp 18   SpO2 99%   Physical Exam Vitals and nursing note reviewed. Exam conducted with a chaperone present.  Constitutional:      Appearance: She is obese. She is not ill-appearing or toxic-appearing.  HENT:     Head: Normocephalic and atraumatic.     Nose: Nose normal.     Mouth/Throat:     Mouth: Mucous membranes are moist.     Pharynx: Oropharynx is clear. Uvula midline. No oropharyngeal exudate or posterior oropharyngeal erythema.     Tonsils: No tonsillar exudate.  Eyes:     General: Lids are normal. Vision grossly intact.        Right eye: No discharge.        Left eye: No  discharge.     Extraocular Movements: Extraocular movements intact.     Conjunctiva/sclera: Conjunctivae normal.     Pupils: Pupils are equal, round, and reactive to light.  Neck:     Trachea: Trachea normal.  Cardiovascular:     Rate and Rhythm: Normal rate and regular rhythm.     Pulses: Normal pulses.     Heart sounds: Normal heart sounds. No murmur heard. Pulmonary:     Effort: Pulmonary  effort is normal. No tachypnea, bradypnea, accessory muscle usage, prolonged expiration or respiratory distress.     Breath sounds: Normal breath sounds. No wheezing or rales.  Chest:     Chest wall: No mass, lacerations, deformity, swelling, tenderness, crepitus or edema.  Abdominal:     General: Bowel sounds are normal. There is no distension.     Palpations: Abdomen is soft.     Tenderness: There is no abdominal tenderness. There is no right CVA tenderness, left CVA tenderness, guarding or rebound.  Genitourinary:    Rectum: Guaiac result negative. Tenderness present. No anal fissure or external hemorrhoid. Normal anal tone.     Comments: Melanotic appearing stool on the gloved finger of this provider after rectal exam. NO rectal mass or stool impaction palpable on exam, however, exam complicated by patient body habitus. Musculoskeletal:        General: No deformity.     Cervical back: Normal range of motion and neck supple. No edema, rigidity or crepitus. No pain with movement, spinous process tenderness or muscular tenderness.     Right lower leg: No edema.     Left lower leg: No edema.  Lymphadenopathy:     Cervical: No cervical adenopathy.  Skin:    General: Skin is warm and dry.     Capillary Refill: Capillary refill takes less than 2 seconds.  Neurological:     General: No focal deficit present.     Mental Status: She is alert and oriented to person, place, and time. Mental status is at baseline.  Psychiatric:        Mood and Affect: Mood normal.    ED Results / Procedures /  Treatments   Labs (all labs ordered are listed, but only abnormal results are displayed) Labs Reviewed  COMPREHENSIVE METABOLIC PANEL - Abnormal; Notable for the following components:      Result Value   BUN 22 (*)    All other components within normal limits  CBC WITH DIFFERENTIAL/PLATELET - Abnormal; Notable for the following components:   RBC 3.70 (*)    Hemoglobin 10.8 (*)    HCT 34.4 (*)    All other components within normal limits  POC OCCULT BLOOD, ED    EKG None  Radiology CT Abdomen Pelvis W Contrast  Result Date: 05/26/2021 CLINICAL DATA:  Rectal pain, melena EXAM: CT ABDOMEN AND PELVIS WITH CONTRAST TECHNIQUE: Multidetector CT imaging of the abdomen and pelvis was performed using the standard protocol following bolus administration of intravenous contrast. CONTRAST:  92m OMNIPAQUE IOHEXOL 350 MG/ML SOLN COMPARISON:  CT abdomen/pelvis 03/02/2021 FINDINGS: Lower chest: The lung bases are clear. The imaged heart is unremarkable. Hepatobiliary: The liver is unremarkable. The gallbladder is surgically absent. There is no biliary ductal dilatation. Pancreas: Unremarkable. Spleen: Unremarkable. Adrenals/Urinary Tract: The adrenals are unremarkable. The extrarenal pelvis on the right is similar to the prior study. The kidneys are otherwise unremarkable, with no focal lesion, , hydronephrosis, or hydroureter. No definite stone is identified, though the ureters are already opacified with contrast due to bolus timing. The bladder is unremarkable. Stomach/Bowel: There is a small hiatal hernia. The stomach is otherwise unremarkable. There is no evidence of bowel obstruction. There is no abnormal bowel wall thickening or inflammatory change. The appendix is normal. There are scattered colonic diverticula without evidence of acute diverticulitis. There is scattered stool throughout the colon. Vascular/Lymphatic: There is minimal calcified atherosclerotic plaque in the nonaneurysmal abdominal  aorta. The main portal and splenic veins are patent. Reproductive:  The uterus is enlarged with multiple lobulated lesions arising from the fundus consistent with fibroids, not significantly changed. There is no adnexal mass. Other: There is no ascites or free air. A spinal stimulator or pain pump 2 is again seen entering the canal at the L2-L3 level and coursing to the right in the soft tissues of the back and coiling in the right chest wall. Fluid surrounding the stimulator lead is unchanged. There is no surrounding inflammatory change. Musculoskeletal: There is advanced degenerative change of the right hip. There is mild degenerative change of the lumbar spine. There is no acute osseous abnormality. IMPRESSION: 1. No acute findings in the abdomen or pelvis. 2. Fibroid uterus, unchanged. 3. Scattered colonic diverticuli without evidence of acute diverticulitis. 4. Unchanged position of the spinal stimulator pain pump tubing, the proximal end of which is coiled in the soft tissues of the right chest wall with unchanged surrounding fluid. No evidence of associated inflammation. 5. 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 6.  Mild aortic Atherosclerosis (ICD10-I70.0). Electronically Signed   By: Valetta Mole M.D.   On: 05/26/2021 15:16    Procedures Procedures   Medications Ordered in ED Medications  iohexol (OMNIPAQUE) 350 MG/ML injection 80 mL (80 mLs Intravenous Contrast Given 05/26/21 1418)    ED Course  I have reviewed the triage vital signs and the nursing notes.  Pertinent labs & imaging results that were available during my care of the patient were reviewed by me and considered in my medical decision making (see chart for details).    MDM Rules/Calculators/A&P                         55 year old female presents with concern for 4 days of anal pain.    Differential diagnosis includes is limited to external hemorrhoid, internal hemorrhoid, anal fissure, fecal impaction, proctitis,  cellulitis, abscess, candidal infection.  Hypertensive on intake, vital signs otherwise normal.  Cardiopulmonary exam is normal, abdominal exam is benign.  GU exam performed with chaperone which revealed candidal infection in the gluteal cleft and in the anal area, no external or internal hemorrhoids palpable, no fecal impaction.  Melanotic appearing stool on the gloved hand of the provider following rectal exam.  CBC  with mild anemia with hemoglobin of 10.8.  CMP otherwise unremarkable, Hemoccult negative.  CT of the abdomen pelvis with diverticulosis without diverticulitis, fibroid uterus, no acute changes in the abdomen or pelvis.  No further [place] in the this time given reassuring physical exam, labs, and CT as well as vital signs.  Suspect discomfort is secondary to candidal infection of the gluteal cleft and anal area.  Will treat with topical clotrimazole and recommend stool softeners for the next few days.  Will provide contact information for outpatient GI follow-up.  Andrea Bryan voiced understanding of her medical evaluation treatment plan.  Each of her questions was answered to her expressed satisfaction.  Return precautions given.  Patient is well-appearing, stable, and appropriate for discharge at this time.  This chart was dictated using voice recognition software, Dragon. Despite the best efforts of this provider to proofread and correct errors, errors may still occur which can change documentation meaning.  Final Clinical Impression(s) / ED Diagnoses Final diagnoses:  Tinea cruris  Rectal pain    Rx / DC Orders ED Discharge Orders          Ordered    clotrimazole (LOTRIMIN) 1 % cream  05/26/21 [place], Gypsy Balsam, PA-C 05/26/21 1602    Lorelle Gibbs, DO 05/27/21 1744

## 2021-05-26 NOTE — ED Notes (Signed)
Had small soft bowel movement this morning.

## 2021-05-26 NOTE — ED Notes (Signed)
Pt in CT.

## 2021-05-26 NOTE — Discharge Instructions (Addendum)
You were seen in the ER today for your rectal pain.  Your physical exam revealed a yeast infection along the crease of your buttocks and in your anal area.  You have been prescribed a topical treatment for this infection.  Your rectal exam was otherwise normal, and the blood work and CT scans you underwent today were also very reassuring.  Below is the contact information for the gastroenterologist with whom you may follow-up.  Additionally would recommend you take senna to encourage softening her stool such as MiraLAX, Metamucil, or stool softeners for the next few days to ensure that your stools are soft and easy to pass.  Return to the ER if you develop any difficulty passing stool, worsening pain, nausea or vomiting does not stop, or any other new severe symptoms.

## 2021-05-26 NOTE — ED Triage Notes (Signed)
Pt states her anus has been hurting for the past 4 days. Pain is constant. She feels like stool is not coming out completely. No blood in stool.

## 2021-06-10 ENCOUNTER — Other Ambulatory Visit: Payer: Self-pay | Admitting: Family Medicine

## 2021-06-10 DIAGNOSIS — Z780 Asymptomatic menopausal state: Secondary | ICD-10-CM

## 2021-06-24 ENCOUNTER — Other Ambulatory Visit: Payer: Self-pay | Admitting: Podiatry

## 2021-07-01 ENCOUNTER — Other Ambulatory Visit: Payer: Self-pay

## 2021-07-01 ENCOUNTER — Encounter (HOSPITAL_COMMUNITY): Payer: Self-pay

## 2021-07-01 ENCOUNTER — Emergency Department (HOSPITAL_COMMUNITY)
Admission: EM | Admit: 2021-07-01 | Discharge: 2021-07-01 | Disposition: A | Discharge status: Home / Self Care | Payer: Medicare Other | Attending: Emergency Medicine | Admitting: Emergency Medicine

## 2021-07-01 DIAGNOSIS — M25532 Pain in left wrist: Secondary | ICD-10-CM | POA: Diagnosis not present

## 2021-07-01 DIAGNOSIS — Z7952 Long term (current) use of systemic steroids: Secondary | ICD-10-CM | POA: Insufficient documentation

## 2021-07-01 DIAGNOSIS — M79602 Pain in left arm: Secondary | ICD-10-CM | POA: Diagnosis present

## 2021-07-01 DIAGNOSIS — E119 Type 2 diabetes mellitus without complications: Secondary | ICD-10-CM | POA: Insufficient documentation

## 2021-07-01 DIAGNOSIS — Z7901 Long term (current) use of anticoagulants: Secondary | ICD-10-CM | POA: Diagnosis not present

## 2021-07-01 DIAGNOSIS — Z79899 Other long term (current) drug therapy: Secondary | ICD-10-CM | POA: Diagnosis not present

## 2021-07-01 DIAGNOSIS — M25512 Pain in left shoulder: Secondary | ICD-10-CM | POA: Insufficient documentation

## 2021-07-01 DIAGNOSIS — E039 Hypothyroidism, unspecified: Secondary | ICD-10-CM | POA: Insufficient documentation

## 2021-07-01 DIAGNOSIS — I10 Essential (primary) hypertension: Secondary | ICD-10-CM | POA: Diagnosis not present

## 2021-07-01 MED ORDER — PREDNISONE 20 MG PO TABS: 40.0000 mg | ORAL_TABLET | Freq: Every day | ORAL | 0 refills | Status: AC

## 2021-07-01 NOTE — ED Provider Notes (Signed)
[facility] DEPT Provider Note   CSN: [id number] Arrival date & time: 07/01/21  1308     History Chief Complaint  Patient presents with   Arm Pain    Andrea Bryan is a 55 y.o. female with history of diabetes mellitus, hypertension, migraine, hyperlipidemia, hypothyroidism, GERD, previous PE.  Presents to the emergency department with complaint of left arm pain.  Patient reports that pain started last Wednesday.  Pain has been intermittent since then.  Pain starts in her left shoulder and runs to her left wrist.  Patient describes pain as a shooting sharp pain.  No aggravating factors.  Pain is improved with movement of arm.  Patient denies any recent falls or injuries.  Patient reports history of spinal surgery 30 years prior due to pseudotumor cerebri.  Patient denies any numbness, weakness, color change, joint swelling, rash.     Arm Pain      Past Medical History:  Diagnosis Date   Diabetes mellitus without complication ([place])    Hypertension     Patient Active Problem List   Diagnosis Date Noted   Pulmonary embolism ([place]) 07/09/2019   GERD (gastroesophageal reflux disease) 07/09/2019   Migraine 07/09/2019   Urge incontinence 07/09/2019   Insomnia 07/09/2019   Osteoarthritis 07/09/2019   Essential hypertension 05/31/2019   Hypothyroidism 05/31/2019   Hypercholesteremia 05/31/2019   Fibroid uterus 05/31/2019   Diverticulitis 05/31/2019    Past Surgical History:  Procedure Laterality Date   KNEE SURGERY       OB History     Gravida  7   Para  3   Term      Preterm  1   AB  4   Living         SAB  2   IAB  2   Ectopic      Multiple      Live Births              Family History  Problem Relation Age of Onset   Diabetes Father    Hypertension Father    Hypertension Mother    Diabetes Mother     Social History   Tobacco Use   Smoking status: Never   Smokeless tobacco: Never  Vaping Use   Vaping  Use: Never used  Substance Use Topics   Alcohol use: Not Currently   Drug use: Not Currently    Home Medications Prior to Admission medications   Medication Sig Start Date End Date Taking? Authorizing Provider  acetaminophen (TYLENOL) 500 MG tablet Take 1,000 mg by mouth every 6 (six) hours as needed for mild pain.     [provider]  atenolol (TENORMIN) 100 MG tablet Take 100 mg by mouth daily. 09/19/16   [provider]  clotrimazole (LOTRIMIN) 1 % cream Apply to affected (gluteal area) area 2 times daily 05/26/21   Sponseller, Rebekah R, PA-C  cyclobenzaprine (FLEXERIL) 10 MG tablet Take 10 mg by mouth in the morning and at bedtime.    [provider]  diclofenac (VOLTAREN) 75 MG EC tablet TAKE 1 TABLET(75 MG) BY MOUTH TWICE DAILY 06/27/21   Criselda Peaches, DPM  Diclofenac Sodium 1.6 % GEL Place 2 g onto the skin every 6 (six) hours as needed (Pain). Patient not taking: No sig reported 07/05/19   McDonald, Mia A, PA-C  dicyclomine (BENTYL) 20 MG tablet Take 1 tablet (20 mg total) by mouth every 12 (twelve) hours as needed (for abdominal pain/cramping).  01/07/21   Antonietta Breach, PA-C  doxycycline (VIBRA-TABS) 100 MG tablet Take 1 tablet (100 mg total) by mouth 2 (two) times daily. Patient not taking: Reported on 01/07/2021 12/12/20   Wallene Huh, DPM  ELIQUIS 5 MG TABS tablet Take 5 mg by mouth 2 (two) times daily. 03/03/19   [provider]  FEROSUL 325 (65 Fe) MG tablet Take 325 mg by mouth daily with breakfast.  05/08/19   [provider]  fluticasone (FLONASE) 50 MCG/ACT nasal spray Place 1 spray into both nostrils daily as needed for allergies.  09/19/16   [provider]  JANUVIA 100 MG tablet Take 100 mg by mouth daily. 06/03/19   [provider]  Levothyroxine Sodium 112 MCG CAPS Take 112 mcg by mouth daily before breakfast. 09/19/16   [provider]  lidocaine (LIDODERM) 5 % Place 1 patch onto the skin daily. Remove &  Discard patch within 12 hours or as directed by MD Patient not taking: No sig reported 07/05/19   McDonald, Mia A, PA-C  lisinopril (ZESTRIL) 20 MG tablet Take 20 mg by mouth daily. 09/19/16   [provider]  loratadine (CLARITIN) 10 MG tablet Take 10 mg by mouth daily.    [provider]  omeprazole (PRILOSEC) 20 MG capsule Take 20 mg by mouth daily. 02/19/18   [provider]  oxybutynin (DITROPAN-XL) 10 MG 24 hr tablet Take 10 mg by mouth at bedtime.  05/08/19   [provider]  polyethylene glycol (MIRALAX / GLYCOLAX) 17 g packet Take 17 g by mouth daily. Patient not taking: Reported on 01/07/2021 10/12/20   Deno Etienne, DO  risperidone (RISPERDAL) 4 MG tablet Take 4 mg by mouth at bedtime. Patient not taking: Reported on 01/07/2021 06/03/19   [provider]  simvastatin (ZOCOR) 20 MG tablet Take 20 mg by mouth daily. 09/19/16   [provider]  SUMAtriptan (IMITREX) 50 MG tablet Take 50 mg by mouth See admin instructions. Take 1 tab at onset of migraine may repeat in 2 hours no more than 4 in 2 hours. 06/03/19   [provider]    Allergies    Metformin, Atorvastatin, Oxycodone, and Tramadol  Review of Systems   Review of Systems  Musculoskeletal:  Positive for myalgias. Negative for arthralgias, back pain, gait problem, joint swelling, neck pain and neck stiffness.  Skin:  Negative for color change, pallor, rash and wound.  Neurological:  Negative for tremors, weakness and numbness.   Physical Exam Updated Vital Signs BP (!) 160/77 (BP Location: Left Arm)   Pulse 65   Temp 98.1 F (36.7 C) (Oral)   Resp 18   SpO2 100%   Physical Exam Vitals and nursing note reviewed.  Constitutional:      General: She is not in acute distress.    Appearance: She is not ill-appearing, toxic-appearing or diaphoretic.  HENT:     Head: Normocephalic.  Eyes:     General: No scleral icterus.       Right eye: No discharge.        Left eye: No  discharge.  Cardiovascular:     Rate and Rhythm: Normal rate.     Pulses:          Radial pulses are 1+ on the right side and 1+ on the left side.  Pulmonary:     Effort: Pulmonary effort is normal.  Musculoskeletal:     Right shoulder: No swelling, deformity, effusion, laceration, tenderness, bony tenderness  or crepitus. Normal range of motion. Normal strength.     Left shoulder: No swelling, deformity, effusion, laceration, tenderness, bony tenderness or crepitus. Normal range of motion. Normal strength.     Right upper arm: Normal.     Left upper arm: Normal.     Right elbow: Normal.     Left elbow: Normal.     Right forearm: Normal.     Left forearm: Normal.     Right wrist: Normal.     Left wrist: Normal.     Right hand: No swelling, deformity, lacerations, tenderness or bony tenderness. Normal range of motion. Normal strength. Normal sensation. Normal capillary refill.     Left hand: No swelling, deformity, lacerations, tenderness or bony tenderness. Normal range of motion. Normal strength. Normal sensation. Normal capillary refill.     Cervical back: Normal.     Comments: No rash, color change, erythema, or warmth noted to bilateral upper extremities.  Skin:    General: Skin is warm and dry.  Neurological:     General: No focal deficit present.     Mental Status: She is alert.     GCS: GCS eye subscore is 4. GCS verbal subscore is 5. GCS motor subscore is 6.  Psychiatric:        Behavior: Behavior is cooperative.    ED Results / Procedures / Treatments   Labs (all labs ordered are listed, but only abnormal results are displayed) Labs Reviewed - No data to display  EKG None  Radiology No results found.  Procedures Procedures   Medications Ordered in ED Medications - No data to display  ED Course  I have reviewed the triage vital signs and the nursing notes.  Pertinent labs & imaging results that were available during my care of the patient were reviewed by me  and considered in my medical decision making (see chart for details).    MDM Rules/Calculators/A&P                           Alert 55 year old female no acute distress, nontoxic appearing.  Presents to emergency department with chief complaint of left arm pain.  Pain has been intermittent since last Wednesday.  Pain stretches from left shoulder to left wrist.  No recent falls or injuries.  Patient denies any color change.  +1 radial pulse bilaterally.  No decreased temperature noted to either upper extremity.  Low suspicion for arterial occlusion at this time.  No swelling, erythema, or warmth.  Low suspicion for VTE at this time.  Patient has full range of motion to all joints of left upper arm, no tenderness, bony tenderness, or deformity.  Low suspicion for fracture deformity at this time.  Suspect that patient symptoms are possibly due to cervical radiculopathy or peripheral neuropathy.  Shared decision making with patient for prednisone.  Patient states that she has diabetes well controlled and elects for prednisone treatment at this time.  We will give patient a short course.  Patient follow-up with PCP and neurosurgery.  Discussed results, findings, treatment and follow up. Patient advised of return precautions. Patient verbalized understanding and agreed with plan.  Patient care was discussed with attending physician Dr. Karle Starch.  Final Clinical Impression(s) / ED Diagnoses Final diagnoses:  Left arm pain    Rx / DC Orders ED Discharge Orders          Ordered    predniSONE (DELTASONE) 20 MG tablet  Daily  07/01/21 1501             Loni Beckwith, PA-C 07/01/21 1536    Truddie Hidden, MD 07/02/21 [phone]

## 2021-07-01 NOTE — Discharge Instructions (Addendum)
You came to the emergency department today to have your left arm pain evaluated.  Your physical exam was reassuring.  Your pain may be due to neuropathy from your diabetes or cervical neuropathy.  Please follow-up with your primary care provider.  I have also given you information to follow-up with [facility].  Please call to schedule a follow-up appointment with them.  I have given you a prescription for steroids today.  Some common side effects include feelings of extra energy, feeling warm, increased appetite, and stomach upset.  If you are diabetic your sugars may run higher than usual.   Get help right away if: Your pain gets much worse and cannot be controlled with medicines. You have weakness or numbness in your hand, arm, face, or leg. You have a high fever. You have a stiff, rigid neck. You lose control of your bowels or your bladder (have incontinence). You have trouble with walking, balance, or speaking.

## 2021-07-01 NOTE — ED Triage Notes (Signed)
Pt arrived via POV, c/o pain to left arm/ shoulder x1 week. Denies any trauma to area. Worse when trying to use arm or pick something up.

## 2021-07-07 ENCOUNTER — Other Ambulatory Visit: Payer: Medicare Other

## 2021-07-07 ENCOUNTER — Ambulatory Visit
Admission: RE | Admit: 2021-07-07 | Discharge: 2021-07-07 | Disposition: A | Discharge status: Home / Self Care | Payer: Medicare Other | Source: Ambulatory Visit | Attending: Family Medicine | Admitting: Family Medicine

## 2021-07-07 ENCOUNTER — Ambulatory Visit: Payer: Medicare Other

## 2021-07-07 ENCOUNTER — Other Ambulatory Visit: Payer: Self-pay

## 2021-07-07 DIAGNOSIS — Z1231 Encounter for screening mammogram for malignant neoplasm of breast: Secondary | ICD-10-CM

## 2021-08-09 ENCOUNTER — Other Ambulatory Visit: Payer: Self-pay

## 2021-08-09 ENCOUNTER — Ambulatory Visit: Payer: Medicare Other | Attending: Physician Assistant

## 2021-08-09 DIAGNOSIS — M25511 Pain in right shoulder: Secondary | ICD-10-CM | POA: Diagnosis present

## 2021-08-09 DIAGNOSIS — M542 Cervicalgia: Secondary | ICD-10-CM | POA: Insufficient documentation

## 2021-08-09 DIAGNOSIS — M25512 Pain in left shoulder: Secondary | ICD-10-CM | POA: Diagnosis present

## 2021-08-09 DIAGNOSIS — R293 Abnormal posture: Secondary | ICD-10-CM | POA: Insufficient documentation

## 2021-08-09 DIAGNOSIS — M6281 Muscle weakness (generalized): Secondary | ICD-10-CM | POA: Insufficient documentation

## 2021-08-09 NOTE — Therapy (Addendum)
[place], [place], [address] Phone: [phone]   Fax:  [phone]  Physical Therapy Evaluation  Patient Details  Name: Andrea Bryan MRN: [id number] Date of Birth: 31-Jul-1966 Referring Provider (PT): Vallarie Mare, MD  Encounter Date: 08/09/2021   PT End of Session - 08/09/21 1359     Visit Number 1    Number of Visits 13    Date for PT Re-Evaluation 09/20/21    Authorization Type UHC MCR    Progress Note Due on Visit 10    PT Start Time 1311    PT Stop Time 1400    PT Time Calculation (min) 49 min    Activity Tolerance Patient tolerated treatment well    Behavior During Therapy [facility] for tasks assessed/performed             Past Medical History:  Diagnosis Date   Diabetes mellitus without complication ([place])    Hypertension     Past Surgical History:  Procedure Laterality Date   KNEE SURGERY      There were no vitals filed for this visit.    Subjective Assessment - 08/09/21 1301     Subjective Pt reports her neck and shoulder pain started over a month ago. Pt reports when her neck started hurting she started to get pain in her Lt shoulder which will move to her Rt "over time". Pt reports the numbness in her fingers has been going on "for years". Pt reports her feelings of off balance, dropping things from her hands, and "clumsiness" started over 3 year ago. Pt says when her shoulders start hurting the Lt will hurt first then the Rt. She states sometimes the "numbness" will go into her upper anterior arm. Pt reports she thinks stress is what caused the pain in her neck and shoulders. Pt says the scar on the back of her neck was "several years ago" and the doctors "went in to take out a stint, but fluid built up in her cranium which had to be taken out". Pt reports recieving physical therapy in the past due to a "knee replacement". Pt denies vision changes, drop attacks, changes in memory or  cognition, hx of falls, or dizziness. Pt says she has to stop to turn around if shes walking but does not know why stating "moving to turn makes me feel unbalanced". Pt says she got an MRI for the neck last week but has not recieved the results from the doctor. Pt does not believe her numbness and tingling in her hands are related to her neck and shoulder pain. Pt says her neck pain gets "worse" with "stress". Pt reports her hypertension, diabetes, and hyperthyroidism is well managed. Pt reports no change in symptoms since seeing physician on 10/25.    Pertinent History obesity, hypertension, hyperthyroidism, hx of PE, hx of ACDF on C4-C6    Limitations Standing;Walking    How long can you sit comfortably? unlimited    How long can you stand comfortably? 1 hour    How long can you walk comfortably? 1 hour    Diagnostic tests See MRI results in media file.    Patient Stated Goals "try to manage my pain"    Currently in Pain? No/denies             [facility] Adult PT Treatment/Exercise:  Therapeutic Exercise: - NA  Manual Therapy: - NA  Neuromuscular re-ed: - NA  Therapeutic Activity: - NA  Self-care/Home Management: -  see pt education    [facility] PT Assessment - 08/09/21 0001       Assessment   Medical Diagnosis M54.12 (ICD-10-CM) - Radiculopathy, cervical region    Referring Provider (PT) Vallarie Mare, MD    Onset Date/Surgical Date --   month ago   Hand Dominance --   both   Next MD Visit 08/12/21    Prior Therapy Yes, knee replacement      Precautions   Precautions None    Precaution Comments --      Restrictions   Weight Bearing Restrictions --      Balance Screen   Has the patient fallen in the past 6 months No   have to stop to turn around or I get off balance, past 3 years.   Has the patient had a decrease in activity level because of a fear of falling?  No    Is the patient reluctant to leave their home because of a fear of falling?  No      Home  Environment   Living Environment Private residence    Living Arrangements Alone    Type of [place] to enter    Entrance Stairs-Number of Steps 8    Entrance Stairs-Rails Right;Can reach both    [place] bars - toilet    Additional Comments apartment      Prior Function   Level of Independence Independent    Vocation Unemployed    Leisure lisen to music, play games      Cognition   Overall Cognitive Status Within Functional Limits for tasks assessed      Observation/Other Assessments   Focus on Therapeutic Outcomes (FOTO)  58% function to 70% predicted      Sensation   Light Touch Appears Intact      Coordination   Gross Motor Movements are Fluid and Coordinated Yes      Posture/Postural Control   Posture/Postural Control Postural limitations    Postural Limitations Rounded Shoulders;Forward head      Deep Tendon Reflexes   Biceps DTR 3+   bilat   Brachioradialis DTR 3+   bilat   Triceps DTR 3+   bilat     AROM   Overall AROM Comments Shoulder AROM WFL but painful in shoulders with bilat flexion, abduction, and IR    Cervical Flexion 62   painful, aberrant movement   Cervical Extension 48   painful, aberrant movement   Cervical - Right Side Bend 20   painful, aberrant movement   Cervical - Left Side Bend 20   painful, aberrant movement   Cervical - Right Rotation 30   painful, aberrant movement   Cervical - Left Rotation 30   painful, aberrant movement     PROM   Overall PROM Comments Same as AROM. Pt reports pain going into shoulder with all directions, greatest s/s in flexion.      Strength   Overall Strength Comments Digit abduction 3/5; thumb extension 4/5 bilaterally    Right Shoulder Flexion 3+/5   painful   Right Shoulder Extension 4-/5    Right Shoulder ABduction 3+/5    Right Shoulder Internal Rotation 4-/5    Right Shoulder External Rotation 4-/5    Left Shoulder Flexion 3+/5   painful   Left  Shoulder Extension 4-/5    Left Shoulder ABduction 3+/5    Left Shoulder Internal Rotation 4-/5  Left Shoulder External Rotation 4-/5    Right Elbow Flexion 4/5    Right Elbow Extension 4/5    Left Elbow Flexion 4/5    Left Elbow Extension 4/5    Right Wrist Flexion 3+/5    Right Wrist Extension 4-/5    Left Wrist Flexion 3+/5    Left Wrist Extension 4-/5    Right Hand Grip (lbs) 40    Left Hand Grip (lbs) 45      Palpation   Spinal mobility TTP with CPA C2-C7 and T1-T8 with greatest reports of pain at C4-C7. Hypomobility into extension C2-T8. hypomobility and symptom provocation with downglides bilat C2-C7.    Palpation comment Tender to palpation to along cervical and thoracic paraspinals bilat. Palpable tightness in bilat upper trap Rt > Lt and thoracic paraspinals.      Special Tests   Other special tests (+) Hoffmans bilat      Spurling's   Findings Positive   forward (-), Lt (-), Rt (+)   Side Right    Comment Pain in shoulder, subsides immediately after      Distraction Test   Findngs Positive    Comment Pt reports it "feels good" like its something her neck "needs".      other    Findings Positive   deep neck flexor endurance test   Comment Unable to maintain chin tuck for 3 seconds.   Pt reports pain in head and neck     Ambulation/Gait   Ambulation/Gait Yes    Ambulation/Gait Assistance 7: Independent    Ambulation Distance (Feet) 40 Feet    Assistive device None    Gait Pattern Decreased arm swing - right;Decreased arm swing - left;Decreased stride length;Decreased stance time - right;Wide base of support    Ambulation Surface Level                        Objective measurements completed on examination: See above findings.                PT Education - 08/09/21 1518     Education Details Pt educated on etiology, plan of care discussion, self management plan and expected progression. Pt also instructed on PT diagnosis, plans of  future HEP, and progression of plan of care for return to functional activities. Discussed evaluation findings and need to follow up with physician at planned appoinment on 11/11 to determine appropriateness for continuing with PT.    Person(s) Educated Patient    Methods Explanation    Comprehension Verbalized understanding              PT Short Term Goals - 08/09/21 1502       PT SHORT TERM GOAL #1   Title Pt report and demonstrate independance with PT provided initial HEP to improve pain management and overall function.    Baseline not issued    Status New    Target Date 08/30/21      PT SHORT TERM GOAL #2   Title Pt will be able to demonstrate postural awareness and report independance with managing posture throughout the day to optimize body mechanics to improve sitting and standing function.    Baseline see flowsheet    Status New    Target Date 09/06/21      PT SHORT TERM GOAL #3   Title Pt will report painfree AROM into shoulder flexion and abduction to indicate improved soft tissue and neurovascular extensibility to improve pain  management.    Baseline see flowsheet    Status New    Target Date 09/06/21               PT Long Term Goals - 08/09/21 1500       PT LONG TERM GOAL #1   Title Pt will achieve FOTO predicted score of 70% to indicate clinically meaningful improvement.    Baseline 58%    Status New    Target Date 09/20/21      PT LONG TERM GOAL #2   Title Pt will demonstrate 4/5 strength on bilateral flexion and abduction to indicate improved innveration of shoulder musculature to improve function and pain.    Baseline see flowsheet    Status New    Target Date 09/06/21      PT LONG TERM GOAL #3   Title Pt will report standing and walking for greater than 1 hour with no pain to improve standing and walking function and indicate improvement in pain management.    Baseline see subjective    Status New    Target Date 09/20/21                     Plan - 08/09/21 1522     Clinical Impression Statement Patient is a 55 y.o. female who presents to OPPT with chief complaint of acute intermittent neck and shoulder pain that began about a month ago that patient attributes to stress. She also reports feelings of being off balance, dropping things from her hands, "clumsiness", and digit numbness that have been ongoing for over a year. She reports these symptoms are not worsening, but staying stable since onset. Patient presents with pain and muscular tightness in cervical, shoulder, and thoracic musculature as well as decreased cervical and shoulder AROM, BUE strength deficits, and postural limitations (see objective measures). Pt also presents with a positive Rt modified Spurling and cervical distraction special testing. In addition to these findings she also demonstrates a positive Hoffmans bilaterally, hyperreflexia of  bilateral triceps, biceps, and brachioradialis DTR, and decrease in shoulder flexion, abduction, wrist flexion/extension, and finger abduction strength. Given these abnormal objective findings coupled with her reports of clumsiness and balance impairments this potentially suggests cervical myelopathy that needs to be further assessed by the referring provider before continuing with skilled PT to address the above stated deficits. She does have a f/u scheduled with the referring provider on 08/12/21 to discuss her MRI results, so PT will be placed on hold until she has f/u with referring provider to determine appropriateness for continuing with PT given today's assessment findings. Patient verbalized understanding and is in agreement with this plan.    Personal Factors and Comorbidities Comorbidity 3+    Comorbidities obesity, hypertension, hyperthyroidism, diabetes    Examination-Activity Limitations Stand;Other   walking   Examination-Participation Restrictions --   None   Stability/Clinical Decision Making  Evolving/Moderate complexity    Clinical Decision Making Moderate    Rehab Potential Good    PT Frequency 2x / week    PT Duration 6 weeks    PT Treatment/Interventions ADLs/Self Care Home Management;Aquatic Therapy;Cryotherapy;Traction;Ultrasound;Gait training;Functional mobility training;Therapeutic activities;Therapeutic exercise;Balance training;Neuromuscular re-education;Patient/family education;Manual techniques;Scar mobilization;Passive range of motion;Dry needling;Taping;Spinal Manipulations;Joint Manipulations    PT Next Visit Plan Did patient f/u with referring provider for clearance to continue with PT. Issue initial HEP. Posture education.    PT Home Exercise Plan not issued    Recommended Other Services f/u with referring provider given assessment findings.  Consulted and Agree with Plan of Care Patient             Patient will benefit from skilled therapeutic intervention in order to improve the following deficits and impairments:  Abnormal gait, Decreased balance, Decreased endurance, Decreased mobility, Difficulty walking, Hypomobility, Decreased activity tolerance, Decreased coordination, Decreased strength, Decreased range of motion, Improper body mechanics, Pain, Postural dysfunction  Visit Diagnosis: Cervicalgia  Acute pain of both shoulders  Muscle weakness (generalized)  Abnormal posture     Problem List Patient Active Problem List   Diagnosis Date Noted   Pulmonary embolism ([place]) 07/09/2019   GERD (gastroesophageal reflux disease) 07/09/2019   Migraine 07/09/2019   Urge incontinence 07/09/2019   Insomnia 07/09/2019   Osteoarthritis 07/09/2019   Essential hypertension 05/31/2019   Hypothyroidism 05/31/2019   Hypercholesteremia 05/31/2019   Fibroid uterus 05/31/2019   Diverticulitis 05/31/2019    Glade Mikesell, SPT 08/09/21 5:22 PM  Gwendolyn Grant, PT, DPT, ATC 08/09/21 5:25 PM  [facility] [address] [place], [place], [address] Phone: [phone]   Fax:  [phone]  Name: Andrea Bryan MRN: [id number] Date of Birth: 1966/04/13

## 2021-08-16 ENCOUNTER — Other Ambulatory Visit: Payer: Self-pay | Admitting: Neurosurgery

## 2021-08-16 DIAGNOSIS — M4712 Other spondylosis with myelopathy, cervical region: Secondary | ICD-10-CM

## 2021-08-20 ENCOUNTER — Other Ambulatory Visit: Payer: Self-pay

## 2021-08-20 ENCOUNTER — Ambulatory Visit
Admission: RE | Admit: 2021-08-20 | Discharge: 2021-08-20 | Disposition: A | Discharge status: Home / Self Care | Payer: Medicare Other | Source: Ambulatory Visit | Attending: Neurosurgery | Admitting: Neurosurgery

## 2021-08-20 DIAGNOSIS — M4712 Other spondylosis with myelopathy, cervical region: Secondary | ICD-10-CM

## 2021-08-23 ENCOUNTER — Other Ambulatory Visit: Payer: Self-pay

## 2021-08-23 ENCOUNTER — Encounter: Payer: Self-pay | Admitting: Physical Therapy

## 2021-08-23 ENCOUNTER — Ambulatory Visit: Payer: Medicare Other | Admitting: Physical Therapy

## 2021-08-23 DIAGNOSIS — M542 Cervicalgia: Secondary | ICD-10-CM

## 2021-08-23 DIAGNOSIS — M25511 Pain in right shoulder: Secondary | ICD-10-CM

## 2021-08-23 DIAGNOSIS — R293 Abnormal posture: Secondary | ICD-10-CM

## 2021-08-23 DIAGNOSIS — M6281 Muscle weakness (generalized): Secondary | ICD-10-CM

## 2021-08-23 NOTE — Therapy (Signed)
[place], [place], [address] Phone: [phone]   Fax:  [phone]  Physical Therapy Treatment  Patient Details  Name: Andrea Bryan MRN: [id number] Date of Birth: 1966-01-18 Referring Provider (PT): Vallarie Mare, MD   Encounter Date: 08/23/2021   PT End of Session - 08/23/21 0918     Visit Number 2    Number of Visits 13    Date for PT Re-Evaluation 09/20/21    Authorization Type UHC MCR    Progress Note Due on Visit 10    PT Start Time 0917    PT Stop Time 1000    PT Time Calculation (min) 43 min    Activity Tolerance Patient tolerated treatment well    Behavior During Therapy [facility] for tasks assessed/performed             Past Medical History:  Diagnosis Date   Diabetes mellitus without complication ([place])    Hypertension     Past Surgical History:  Procedure Laterality Date   KNEE SURGERY      There were no vitals filed for this visit.   Subjective Assessment - 08/23/21 0923     Subjective Pt reports that her neck is about the same.  She was cleared by the referring MD for PT despite signs of myelopathy.  CT showed no significant central impingment.  She reports 8/10 neck and shoulder pain today.    Pertinent History obesity, hypertension, hyperthyroidism, hx of PE, hx of ACDF on C4-C6    Limitations Standing;Walking    How long can you sit comfortably? unlimited    How long can you stand comfortably? 1 hour    How long can you walk comfortably? 1 hour    Diagnostic tests See MRI and CT results in media file.    Patient Stated Goals "try to manage my pain"              [facility] Adult PT Treatment/Exercise:  Therapeutic Exercise: - UBE 2.5'/2.5' fwd and backward for warm up while taking subjective - cervical isometrics sagittal and frontal - 5'' holds 10x ea - standing row - 3x10 - TYB - standing shoulder ext - YTB - 3x10 - bil ER with YTB - 3x10 (next visit) - Chin  tuck with towel in supine - 3x10 - DNF training - 5'' hold x10 - creating and reviewing HEP  Patient Education: - HEP was updated and reissued to patient; pt educated on HEP, was provided handout, and verbally confirmed understanding of exercises.       PT Short Term Goals - 08/09/21 1502       PT SHORT TERM GOAL #1   Title Pt report and demonstrate independance with PT provided initial HEP to improve pain management and overall function.    Baseline not issued    Status New    Target Date 08/30/21      PT SHORT TERM GOAL #2   Title Pt will be able to demonstrate postural awareness and report independance with managing posture throughout the day to optimize body mechanics to improve sitting and standing function.    Baseline see flowsheet    Status New    Target Date 09/06/21      PT SHORT TERM GOAL #3   Title Pt will report painfree AROM into shoulder flexion and abduction to indicate improved soft tissue and neurovascular extensibility to improve pain management.    Baseline see flowsheet    Status  New    Target Date 09/06/21               PT Long Term Goals - 08/09/21 1500       PT LONG TERM GOAL #1   Title Pt will achieve FOTO predicted score of 70% to indicate clinically meaningful improvement.    Baseline 58%    Status New    Target Date 09/20/21      PT LONG TERM GOAL #2   Title Pt will demonstrate 4/5 strength on bilateral flexion and abduction to indicate improved innveration of shoulder musculature to improve function and pain.    Baseline see flowsheet    Status New    Target Date 09/06/21      PT LONG TERM GOAL #3   Title Pt will report standing and walking for greater than 1 hour with no pain to improve standing and walking function and indicate improvement in pain management.    Baseline see subjective    Status New    Target Date 09/20/21                   Plan - 08/23/21 1000     Clinical Impression Statement Overall, Andrea Bryan is  progressing fair with therapy.  Pt reports mild pain reduction following therapy.  Today we concentrated on cervico-scapulothoracic strengthening and pain reduction.  Pt shows significant increase in tone bil UT and R>L sub occipitals which is relieved somewhat with manual therapy.  Pt with high level of baseline pain, but is able to complete therex with good form.  Pt will continue to benefit from skilled physical therapy to address remaining deficits and achieve listed goals.  Continue per POC.    Personal Factors and Comorbidities Comorbidity 3+    Comorbidities obesity, hypertension, hyperthyroidism, diabetes    Examination-Activity Limitations Stand;Other   walking   Examination-Participation Restrictions --   None   Stability/Clinical Decision Making Evolving/Moderate complexity    Rehab Potential Good    PT Frequency 2x / week    PT Duration 6 weeks    PT Treatment/Interventions ADLs/Self Care Home Management;Aquatic Therapy;Cryotherapy;Traction;Ultrasound;Gait training;Functional mobility training;Therapeutic activities;Therapeutic exercise;Balance training;Neuromuscular re-education;Patient/family education;Manual techniques;Scar mobilization;Passive range of motion;Dry needling;Taping;Spinal Manipulations;Joint Manipulations    PT Next Visit Plan Did patient f/u with referring provider for clearance to continue with PT. Issue initial HEP. Posture education.    PT Home Exercise Plan [place] and Agree with Plan of Care Patient             Patient will benefit from skilled therapeutic intervention in order to improve the following deficits and impairments:  Abnormal gait, Decreased balance, Decreased endurance, Decreased mobility, Difficulty walking, Hypomobility, Decreased activity tolerance, Decreased coordination, Decreased strength, Decreased range of motion, Improper body mechanics, Pain, Postural dysfunction  Visit Diagnosis: Cervicalgia  Acute pain of both  shoulders  Muscle weakness (generalized)  Abnormal posture     Problem List Patient Active Problem List   Diagnosis Date Noted   Pulmonary embolism ([place]) 07/09/2019   GERD (gastroesophageal reflux disease) 07/09/2019   Migraine 07/09/2019   Urge incontinence 07/09/2019   Insomnia 07/09/2019   Osteoarthritis 07/09/2019   Essential hypertension 05/31/2019   Hypothyroidism 05/31/2019   Hypercholesteremia 05/31/2019   Fibroid uterus 05/31/2019   Diverticulitis 05/31/2019    Mathis Dad, PT 08/23/2021, 10:01 AM  [place] [facility] [address] [place], [place], [address] Phone: [phone]   Fax:  [phone]  Name: Andrea  ADELHEID Bryan MRN: [id number] Date of Birth: 08-21-1966

## 2021-08-23 NOTE — Patient Instructions (Signed)
Access Code: [email or web address] URL: [email or web address] Date: 08/23/2021 Prepared by: Shearon Balo  Exercises Standing Isometric Cervical Sidebending with Manual Resistance - 1 x daily - 7 x weekly - 1 sets - 10 reps - 5'' hold Standing Isometric Cervical Extension with Manual Resistance - 1 x daily - 7 x weekly - 1 sets - 10 reps - 5'' hold Standing Isometric Cervical Flexion with Manual Resistance - 1 x daily - 7 x weekly - 1 sets - 10 reps - 5'' hold Seated Cervical Retraction - 3 x daily - 7 x weekly - 1 sets - 10 reps - 5'' hold

## 2021-08-30 ENCOUNTER — Encounter: Payer: Self-pay | Admitting: Physical Therapy

## 2021-08-30 ENCOUNTER — Other Ambulatory Visit: Payer: Self-pay

## 2021-08-30 ENCOUNTER — Ambulatory Visit: Payer: Medicare Other | Admitting: Physical Therapy

## 2021-08-30 DIAGNOSIS — M542 Cervicalgia: Secondary | ICD-10-CM | POA: Diagnosis not present

## 2021-08-30 DIAGNOSIS — M25511 Pain in right shoulder: Secondary | ICD-10-CM

## 2021-08-30 DIAGNOSIS — R293 Abnormal posture: Secondary | ICD-10-CM

## 2021-08-30 DIAGNOSIS — M6281 Muscle weakness (generalized): Secondary | ICD-10-CM

## 2021-08-30 NOTE — Therapy (Signed)
[place], [place], [address] Phone: [phone]   Fax:  [phone]  Physical Therapy Treatment  Patient Details  Name: Andrea Bryan MRN: [id number] Date of Birth: 1966-09-01 Referring Provider (PT): Vallarie Mare, MD   Encounter Date: 08/30/2021   PT End of Session - 08/30/21 1000     Visit Number 3    Number of Visits 13    Date for PT Re-Evaluation 09/20/21    Authorization Type UHC MCR    Progress Note Due on Visit 10    PT Start Time 1000    PT Stop Time 1042    PT Time Calculation (min) 42 min    Activity Tolerance Patient tolerated treatment well    Behavior During Therapy [facility] for tasks assessed/performed             Past Medical History:  Diagnosis Date   Diabetes mellitus without complication ([place])    Hypertension     Past Surgical History:  Procedure Laterality Date   KNEE SURGERY      There were no vitals filed for this visit.   Subjective Assessment - 08/30/21 1004     Subjective Pt reports that her neck has been feeling somewhat better.  She hsa been HEP compliant.  She reports 5/10 neck and shoulder pain today.    Pertinent History obesity, hypertension, hyperthyroidism, hx of PE, hx of ACDF on C4-C6    Limitations Standing;Walking    How long can you sit comfortably? unlimited    How long can you stand comfortably? 1 hour    How long can you walk comfortably? 1 hour    Diagnostic tests See MRI and CT results in media file.    Patient Stated Goals "try to manage my pain"               [facility] Adult PT Treatment/Exercise:   Therapeutic Exercise: - UBE 2.5'/2.5' fwd and backward for warm up while taking subjective - cervical isometrics sagittal and frontal - 5'' holds 10x ea with blue TB - standing row - 3x10 - RTB - standing shoulder ext - RTB - 3x10 - bil ER with YTB - 3x10 - horizontal abduction YTB - 3x10 - Chin tuck with bal on wall 2x10 - Chin  tuck ball on wall with bil ER with scapular retraction (no band) 2x10 - Chin tuck with in supine w/ gentle OP - 3x10 - DNF training - 10'' hold x10  Manual therapy, concentrating on increasing extensibility of restricted tissue to reduce discomfort and improve mechanics in functional movement: - sub occipital release       PT Short Term Goals - 08/30/21 1007       PT SHORT TERM GOAL #1   Title Pt report and demonstrate independance with PT provided initial HEP to improve pain management and overall function.    Status Achieved    Target Date 08/30/21      PT SHORT TERM GOAL #2   Title Pt will be able to demonstrate postural awareness and report independance with managing posture throughout the day to optimize body mechanics to improve sitting and standing function.    Baseline see flowsheet    Status New    Target Date 09/06/21      PT SHORT TERM GOAL #3   Title Pt will report painfree AROM into shoulder flexion and abduction to indicate improved soft tissue and neurovascular extensibility to improve pain management.  Baseline see flowsheet    Status New    Target Date 09/06/21               PT Long Term Goals - 08/09/21 1500       PT LONG TERM GOAL #1   Title Pt will achieve FOTO predicted score of 70% to indicate clinically meaningful improvement.    Baseline 58%    Status New    Target Date 09/20/21      PT LONG TERM GOAL #2   Title Pt will demonstrate 4/5 strength on bilateral flexion and abduction to indicate improved innveration of shoulder musculature to improve function and pain.    Baseline see flowsheet    Status New    Target Date 09/06/21      PT LONG TERM GOAL #3   Title Pt will report standing and walking for greater than 1 hour with no pain to improve standing and walking function and indicate improvement in pain management.    Baseline see subjective    Status New    Target Date 09/20/21                   Plan - 08/30/21 1025      Clinical Impression Statement Overall, Xolani is progressing well with therapy.  Pt reports no increase in baseline pain following therapy.  Today we concentrated on cervico-scapulothoracic strengthening.  Pt is progressing exercise intensity as expected to good effect with reduction in baseline pain.  Must be cued for form with chin tuck, but is able to complete with good form.  Pt will continue to benefit from skilled physical therapy to address remaining deficits and achieve listed goals.  Continue per POC.    Personal Factors and Comorbidities Comorbidity 3+    Comorbidities obesity, hypertension, hyperthyroidism, diabetes    Examination-Activity Limitations Stand;Other   walking   Examination-Participation Restrictions --   None   Stability/Clinical Decision Making Evolving/Moderate complexity    Rehab Potential Good    PT Frequency 2x / week    PT Duration 6 weeks    PT Treatment/Interventions ADLs/Self Care Home Management;Aquatic Therapy;Cryotherapy;Traction;Ultrasound;Gait training;Functional mobility training;Therapeutic activities;Therapeutic exercise;Balance training;Neuromuscular re-education;Patient/family education;Manual techniques;Scar mobilization;Passive range of motion;Dry needling;Taping;Spinal Manipulations;Joint Manipulations    PT Next Visit Plan Did patient f/u with referring provider for clearance to continue with PT. Issue initial HEP. Posture education.    PT Home Exercise Plan [place] and Agree with Plan of Care Patient             Patient will benefit from skilled therapeutic intervention in order to improve the following deficits and impairments:  Abnormal gait, Decreased balance, Decreased endurance, Decreased mobility, Difficulty walking, Hypomobility, Decreased activity tolerance, Decreased coordination, Decreased strength, Decreased range of motion, Improper body mechanics, Pain, Postural dysfunction  Visit Diagnosis: Cervicalgia  Acute  pain of both shoulders  Muscle weakness (generalized)  Abnormal posture     Problem List Patient Active Problem List   Diagnosis Date Noted   Pulmonary embolism ([place]) 07/09/2019   GERD (gastroesophageal reflux disease) 07/09/2019   Migraine 07/09/2019   Urge incontinence 07/09/2019   Insomnia 07/09/2019   Osteoarthritis 07/09/2019   Essential hypertension 05/31/2019   Hypothyroidism 05/31/2019   Hypercholesteremia 05/31/2019   Fibroid uterus 05/31/2019   Diverticulitis 05/31/2019    Mathis Dad, PT 08/30/2021, 10:43 AM  [facility] [address] [place], [place], [address] Phone: [phone]   Fax:  [phone]  Name: SHERRIN STAHLE MRN: [id number] Date of Birth: [birth year]

## 2021-09-01 ENCOUNTER — Other Ambulatory Visit: Payer: Self-pay

## 2021-09-01 ENCOUNTER — Ambulatory Visit: Payer: Medicare Other | Attending: Neurosurgery

## 2021-09-01 DIAGNOSIS — M542 Cervicalgia: Secondary | ICD-10-CM | POA: Diagnosis present

## 2021-09-01 DIAGNOSIS — M6281 Muscle weakness (generalized): Secondary | ICD-10-CM | POA: Insufficient documentation

## 2021-09-01 DIAGNOSIS — R293 Abnormal posture: Secondary | ICD-10-CM | POA: Diagnosis present

## 2021-09-01 DIAGNOSIS — M25512 Pain in left shoulder: Secondary | ICD-10-CM | POA: Diagnosis present

## 2021-09-01 DIAGNOSIS — M25511 Pain in right shoulder: Secondary | ICD-10-CM | POA: Insufficient documentation

## 2021-09-01 NOTE — Therapy (Addendum)
[place], [place], [address] Phone: [phone]   Fax:  [phone]  Physical Therapy Treatment  Patient Details  Name: AZRA ABRELL MRN: [id number] Date of Birth: 1965-10-09 Referring Provider (PT): Vallarie Mare, MD   Encounter Date: 09/01/2021   PT End of Session - 09/01/21 1344     Visit Number 4    Number of Visits 13    Date for PT Re-Evaluation 09/20/21    Authorization Type UHC MCR    Progress Note Due on Visit 10    PT Start Time 1316    PT Stop Time 1400    PT Time Calculation (min) 44 min    Activity Tolerance Patient tolerated treatment well    Behavior During Therapy [facility] for tasks assessed/performed             Past Medical History:  Diagnosis Date   Diabetes mellitus without complication ([place])    Hypertension     Past Surgical History:  Procedure Laterality Date   KNEE SURGERY      There were no vitals filed for this visit.   Subjective Assessment - 09/01/21 1336     Subjective Pt says her neck felt pretty good throughout the week, but that it started hurting alot last night and its 8/10 currently. Pt says she did not do exercises today.    Pertinent History obesity, hypertension, hyperthyroidism, hx of PE, hx of ACDF on C4-C6    Limitations Standing;Walking    How long can you sit comfortably? unlimited    How long can you stand comfortably? 1 hour    How long can you walk comfortably? 1 hour    Diagnostic tests See MRI and CT results in media file.    Patient Stated Goals "try to manage my pain"    Currently in Pain? Yes    Pain Score 8     Pain Location Neck    Pain Orientation Left    Pain Descriptors / Indicators Aching    Pain Type Chronic pain    Pain Radiating Towards Lt shoulder    Pain Onset Yesterday    Pain Frequency Constant    Aggravating Factors  unknown    Pain Relieving Factors massage             [facility] Adult PT Treatment/Exercise:    Therapeutic Exercise: - UBE 2.5'/2.5' fwd and backward for warm up while taking subjective - Seated chin tucks 1x10x5sec - DNF training - 10'' hold x10 - Cervical isometrics flexion, extension, SB bilat 1x10x5sec - standing row - 3x10 - RTB - standing shoulder ext - RTB - 3x10 - horizontal abduction YTB - 3x10  Not performed today:  - bil ER with YTB - 3x10 - Chin tuck with bal on wall 2x10 - Chin tuck ball on wall with bil ER with scapular retraction (no band) 2x10 - Chin tuck with in supine w/ gentle OP - 3x10 Manual therapy, concentrating on increasing extensibility of restricted tissue to reduce discomfort and improve mechanics in functional movement: - sub occipital release, STM of Lt scalenes and UT. PROM stretching in cervical SB bilat.   Self Care - Discussed pain management techniques at home (tennis ball for trigger point release and HEP for pain)                           PT Short Term Goals - 08/30/21 1007  PT SHORT TERM GOAL #1   Title Pt report and demonstrate independance with PT provided initial HEP to improve pain management and overall function.    Status Achieved    Target Date 08/30/21      PT SHORT TERM GOAL #2   Title Pt will be able to demonstrate postural awareness and report independance with managing posture throughout the day to optimize body mechanics to improve sitting and standing function.    Baseline see flowsheet    Status New    Target Date 09/06/21      PT SHORT TERM GOAL #3   Title Pt will report painfree AROM into shoulder flexion and abduction to indicate improved soft tissue and neurovascular extensibility to improve pain management.    Baseline see flowsheet    Status New    Target Date 09/06/21               PT Long Term Goals - 08/09/21 1500       PT LONG TERM GOAL #1   Title Pt will achieve FOTO predicted score of 70% to indicate clinically meaningful improvement.    Baseline 58%    Status  New    Target Date 09/20/21      PT LONG TERM GOAL #2   Title Pt will demonstrate 4/5 strength on bilateral flexion and abduction to indicate improved innveration of shoulder musculature to improve function and pain.    Baseline see flowsheet    Status New    Target Date 09/06/21      PT LONG TERM GOAL #3   Title Pt will report standing and walking for greater than 1 hour with no pain to improve standing and walking function and indicate improvement in pain management.    Baseline see subjective    Status New    Target Date 09/20/21                   Plan - 09/01/21 1351     Clinical Impression Statement Pt arrived to session with increased pain reporting 8/10. Todays session focused on pain management and decreasing radiating symtpoms from Lt cervical spine into Lt shoulder while increasing activity tolerance with gentle exercises. Palpable decrease in muscle tightness following soft tissue massage of bilat cervical paraspinals and occipital muscles. Pt tolerated todays session well reporting decrease in symtpoms entirely at the end of session.    Personal Factors and Comorbidities Comorbidity 3+    Comorbidities obesity, hypertension, hyperthyroidism, diabetes    Examination-Activity Limitations Stand;Other   walking   Examination-Participation Restrictions --   None   Stability/Clinical Decision Making --    Rehab Potential --    PT Frequency --    PT Duration --    PT Treatment/Interventions ADLs/Self Care Home Management;Aquatic Therapy;Cryotherapy;Traction;Ultrasound;Gait training;Functional mobility training;Therapeutic activities;Therapeutic exercise;Balance training;Neuromuscular re-education;Patient/family education;Manual techniques;Scar mobilization;Passive range of motion;Dry needling;Taping;Spinal Manipulations;Joint Manipulations    PT Next Visit Plan Progress HEP and continue education on pain management techniques.    PT Home Exercise Plan [place] and Agree with Plan of Care Patient             Patient will benefit from skilled therapeutic intervention in order to improve the following deficits and impairments:  Abnormal gait, Decreased balance, Decreased endurance, Decreased mobility, Difficulty walking, Hypomobility, Decreased activity tolerance, Decreased coordination, Decreased strength, Decreased range of motion, Improper body mechanics, Pain, Postural dysfunction  Visit Diagnosis: No diagnosis found.     Problem List  Patient Active Problem List   Diagnosis Date Noted   Pulmonary embolism ([place]) 07/09/2019   GERD (gastroesophageal reflux disease) 07/09/2019   Migraine 07/09/2019   Urge incontinence 07/09/2019   Insomnia 07/09/2019   Osteoarthritis 07/09/2019   Essential hypertension 05/31/2019   Hypothyroidism 05/31/2019   Hypercholesteremia 05/31/2019   Fibroid uterus 05/31/2019   Diverticulitis 05/31/2019    Glade Myhre, SPT 09/01/21 2:10 PM   [facility] [address] [place], [place], [address] Phone: [phone]   Fax:  [phone]  Name: BRUNILDA EBLE MRN: [id number] Date of Birth: 08/03/1966

## 2021-09-06 ENCOUNTER — Telehealth: Payer: Self-pay

## 2021-09-06 ENCOUNTER — Ambulatory Visit: Payer: Medicare Other

## 2021-09-06 NOTE — Telephone Encounter (Signed)
Spoke with patient regarding missed PT appointment. Reviewed attendance policy and confirmed next scheduled visit.

## 2021-09-08 ENCOUNTER — Ambulatory Visit: Payer: Medicare Other

## 2021-09-08 ENCOUNTER — Other Ambulatory Visit: Payer: Self-pay

## 2021-09-08 DIAGNOSIS — R293 Abnormal posture: Secondary | ICD-10-CM

## 2021-09-08 DIAGNOSIS — M25511 Pain in right shoulder: Secondary | ICD-10-CM

## 2021-09-08 DIAGNOSIS — M542 Cervicalgia: Secondary | ICD-10-CM

## 2021-09-08 DIAGNOSIS — M6281 Muscle weakness (generalized): Secondary | ICD-10-CM

## 2021-09-08 NOTE — Therapy (Addendum)
[place], [place], [address] Phone: [phone]   Fax:  [phone]  Physical Therapy Treatment  Patient Details  Name: Andrea Bryan MRN: [id number] Date of Birth: 10-17-1965 Referring Provider (PT): Vallarie Mare, MD   Encounter Date: 09/08/2021   PT End of Session - 09/08/21 1322     Visit Number 5    Number of Visits 13    Date for PT Re-Evaluation 09/20/21    Authorization Type UHC MCR    Progress Note Due on Visit 10    PT Start Time 7169    PT Stop Time 1400    PT Time Calculation (min) 43 min    Activity Tolerance Patient tolerated treatment well    Behavior During Therapy [facility] for tasks assessed/performed             Past Medical History:  Diagnosis Date   Diabetes mellitus without complication ([place])    Hypertension     Past Surgical History:  Procedure Laterality Date   KNEE SURGERY      There were no vitals filed for this visit.   Subjective Assessment - 09/08/21 1507     Subjective Pt reports "my neck feels pretty good today" and does not report any pain. Pt says that using the tennis ball for muscles on the neck has been beneficial and that she has been continuing with her exercises. Pt reports she has a f/u with her doctor next week.    Pertinent History obesity, hypertension, hyperthyroidism, hx of PE, hx of ACDF on C4-C6    Limitations Standing;Walking    How long can you sit comfortably? unlimited    How long can you stand comfortably? 1 hour    How long can you walk comfortably? 1 hour    Diagnostic tests See MRI and CT results in media file.    Patient Stated Goals "try to manage my pain"    Currently in Pain? No/denies              [facility] Adult PT Treatment/Exercise:   Therapeutic Exercise: - UBE 2.0'/2.0' fwd and backward for warm up while taking subjective - standing row - 2x12 - GTB - standing shoulder ext - GTB - 3x10 - T and Y at wall 2x12  -  shoulder IR/ER green band bilat 2x15  - horiz abd/add with red band 1x10; discontinued due to inability to perform with proper form - thoracic extension 1x15 at chair  - wall pushups 2x12  - wall circles with towel, 2x10 bilat each direction -shoulder flexion and abduction bilat 3lbs DB, 2x10 each -Updated HEP   Manual NA   Self Care - NA            [facility] PT Assessment - 09/08/21 0001       Posture/Postural Control   Postural Limitations Rounded Shoulders;Forward head                               PT Short Term Goals - 09/08/21 1533       PT SHORT TERM GOAL #1   Title Pt report and demonstrate independance with PT provided initial HEP to improve pain management and overall function.    Status Achieved    Target Date 08/30/21      PT SHORT TERM GOAL #2   Title Pt will be able to demonstrate postural awareness and report independance with  managing posture throughout the day to optimize body mechanics to improve sitting and standing function.    Baseline continues to require postural cues    Status On-going    Target Date 09/06/21      PT SHORT TERM GOAL #3   Title Pt will report painfree AROM into shoulder flexion and abduction to indicate improved soft tissue and neurovascular extensibility to improve pain management.    Baseline 12/8: able to complete strengthening through full range, AROM not formally assessed.    Status Achieved    Target Date 09/06/21               PT Long Term Goals - 08/09/21 1500       PT LONG TERM GOAL #1   Title Pt will achieve FOTO predicted score of 70% to indicate clinically meaningful improvement.    Baseline 58%    Status New    Target Date 09/20/21      PT LONG TERM GOAL #2   Title Pt will demonstrate 4/5 strength on bilateral flexion and abduction to indicate improved innveration of shoulder musculature to improve function and pain.    Baseline see flowsheet    Status New    Target Date 09/06/21       PT LONG TERM GOAL #3   Title Pt will report standing and walking for greater than 1 hour with no pain to improve standing and walking function and indicate improvement in pain management.    Baseline see subjective    Status New    Target Date 09/20/21                   Plan - 09/08/21 1515     Clinical Impression Statement Pt tolerated todays treatment session well with no reports of pain throughout. Todays session focused on shoulder and periscapular strengthening throughout complete shoulder ROM. Pt required heavy verbal and tactile cues for elbow extension, holding the head in a relaxed position throughout shoulder exercises, and form and technique of therex. Pt was unable to maintain correction in form without repeated cues. Pt reports moderate fatigue in shoulder muscles at the end of session.    Personal Factors and Comorbidities Comorbidity 3+    Comorbidities obesity, hypertension, hyperthyroidism, diabetes    Examination-Activity Limitations Stand;Other   walking   Examination-Participation Restrictions --   None   PT Treatment/Interventions ADLs/Self Care Home Management;Aquatic Therapy;Cryotherapy;Traction;Ultrasound;Gait training;Functional mobility training;Therapeutic activities;Therapeutic exercise;Balance training;Neuromuscular re-education;Patient/family education;Manual techniques;Scar mobilization;Passive range of motion;Dry needling;Taping;Spinal Manipulations;Joint Manipulations    PT Next Visit Plan Progress HEP and continue education on pain management techniques.    PT Home Exercise Plan [place] and Agree with Plan of Care Patient             Patient will benefit from skilled therapeutic intervention in order to improve the following deficits and impairments:  Abnormal gait, Decreased balance, Decreased endurance, Decreased mobility, Difficulty walking, Hypomobility, Decreased activity tolerance, Decreased coordination, Decreased  strength, Decreased range of motion, Improper body mechanics, Pain, Postural dysfunction  Visit Diagnosis: Cervicalgia  Acute pain of both shoulders  Muscle weakness (generalized)  Abnormal posture     Problem List Patient Active Problem List   Diagnosis Date Noted   Pulmonary embolism ([place]) 07/09/2019   GERD (gastroesophageal reflux disease) 07/09/2019   Migraine 07/09/2019   Urge incontinence 07/09/2019   Insomnia 07/09/2019   Osteoarthritis 07/09/2019   Essential hypertension 05/31/2019   Hypothyroidism 05/31/2019   Hypercholesteremia 05/31/2019  Fibroid uterus 05/31/2019   Diverticulitis 05/31/2019   Glade Folkert, SPT 09/08/21 3:36 PM   [facility] [address] [place], [place], [address] Phone: [phone]   Fax:  [phone]  Name: Andrea Bryan MRN: [id number] Date of Birth: 17-May-1966

## 2021-09-13 ENCOUNTER — Ambulatory Visit: Payer: Medicare Other

## 2021-09-13 ENCOUNTER — Other Ambulatory Visit: Payer: Self-pay

## 2021-09-13 DIAGNOSIS — M25511 Pain in right shoulder: Secondary | ICD-10-CM

## 2021-09-13 DIAGNOSIS — M542 Cervicalgia: Secondary | ICD-10-CM

## 2021-09-13 DIAGNOSIS — M6281 Muscle weakness (generalized): Secondary | ICD-10-CM

## 2021-09-13 DIAGNOSIS — R293 Abnormal posture: Secondary | ICD-10-CM

## 2021-09-13 NOTE — Therapy (Addendum)
[place], [place], [address] Phone: [phone]   Fax:  [phone]  Physical Therapy Treatment  Patient Details  Name: Andrea Bryan MRN: [id number] Date of Birth: 11-Jun-1966 Referring Provider (PT): Vallarie Mare, MD   Encounter Date: 09/13/2021   PT End of Session - 09/13/21 1806     Visit Number 6    Number of Visits 13    Date for PT Re-Evaluation 09/20/21    Authorization Type UHC MCR    Progress Note Due on Visit 10    PT Start Time 1148    PT Stop Time 1234    PT Time Calculation (min) 46 min    Activity Tolerance Patient tolerated treatment well    Behavior During Therapy [facility] for tasks assessed/performed;Flat affect             Past Medical History:  Diagnosis Date   Diabetes mellitus without complication ([place])    Hypertension     Past Surgical History:  Procedure Laterality Date   KNEE SURGERY      There were no vitals filed for this visit.   Subjective Assessment - 09/13/21 1647     Subjective Pt reports her neck "doesn't feel too bad" today, but that it is a 5/10 pain. Pt said she has tried to do exercises and use tennis ball for massaging her neck and it provide relief acutely, but does not help when she stops. Pt says when her neck started hurting she stopped doing her HEP.    Pertinent History obesity, hypertension, hyperthyroidism, hx of PE, hx of ACDF on C4-C6    Limitations Standing;Walking    How long can you sit comfortably? unlimited    How long can you stand comfortably? 1 hour    How long can you walk comfortably? 1 hour    Diagnostic tests See MRI and CT results in media file.    Patient Stated Goals "try to manage my pain"    Currently in Pain? Yes    Pain Score 5     Pain Location Neck    Pain Orientation Left    Pain Descriptors / Indicators Aching    Pain Type Chronic pain    Pain Onset Yesterday    Pain Frequency Constant    Aggravating Factors   unknown    Pain Relieving Factors rest, massage            [facility] Adult PT Treatment/Exercise:   Therapeutic Exercise: - UBE 2.0'/2.0' fwd and backward for warm up while taking subjective - standing shoulder D1/D2 diagonals yellow band 2x10 bilat - Upper trap stretch 2x30secs to Rt - Levator scap stretch 2x30secs  to Rt  - Deep neck flexor exercise 10sx10 - Towel stretch into cervical rotation 1x15 bilat  - updated HEP     *not performed today  - T and Y at wall 2x12  - shoulder IR/ER green band bilat 2x15  - horiz abd/add with red band 1x10; discontinued due to inability to perform with proper form - thoracic extension 1x15 at chair  - wall pushups 2x12  - wall circles with towel, 2x10 bilat each direction -shoulder flexion and abduction bilat 3lbs DB, 2x10 each -Updated HEP  Manual - suboccipital release 3x30secs  - STM of cervical paraspinals bilat  - cervical distraction grade 3, 2x6, 10 sec holds   Self Care - NA  PT Short Term Goals - 09/08/21 1533       PT SHORT TERM GOAL #1   Title Pt report and demonstrate independance with PT provided initial HEP to improve pain management and overall function.    Status Achieved    Target Date 08/30/21      PT SHORT TERM GOAL #2   Title Pt will be able to demonstrate postural awareness and report independance with managing posture throughout the day to optimize body mechanics to improve sitting and standing function.    Baseline continues to require postural cues    Status On-going    Target Date 09/06/21      PT SHORT TERM GOAL #3   Title Pt will report painfree AROM into shoulder flexion and abduction to indicate improved soft tissue and neurovascular extensibility to improve pain management.    Baseline 12/8: able to complete strengthening through full range, AROM not formally assessed.    Status Achieved    Target Date 09/06/21               PT Long  Term Goals - 08/09/21 1500       PT LONG TERM GOAL #1   Title Pt will achieve FOTO predicted score of 70% to indicate clinically meaningful improvement.    Baseline 58%    Status New    Target Date 09/20/21      PT LONG TERM GOAL #2   Title Pt will demonstrate 4/5 strength on bilateral flexion and abduction to indicate improved innveration of shoulder musculature to improve function and pain.    Baseline see flowsheet    Status New    Target Date 09/06/21      PT LONG TERM GOAL #3   Title Pt will report standing and walking for greater than 1 hour with no pain to improve standing and walking function and indicate improvement in pain management.    Baseline see subjective    Status New    Target Date 09/20/21                   Plan - 09/13/21 1810     Clinical Impression Statement Todays session focused on shoulder and periscapular strengthening throughout complete shoulder ROM, cervical mobility, and manual for pain management. Pt required heavy verbal and tactile cues throughout for form and technique. Pt unable to repeat exercises with multiple sets again correctly without continued verbal cues for form. Pt reports increased pain bilat in cervical spine following exercises for deep neck flexors and became tearful; however, pt completed entire exercise with no indication or report of discomfort while performing the exercise.    Personal Factors and Comorbidities Comorbidity 3+    Comorbidities obesity, hypertension, hyperthyroidism, diabetes    Examination-Activity Limitations Stand;Other   walking   Examination-Participation Restrictions --   None   PT Treatment/Interventions ADLs/Self Care Home Management;Aquatic Therapy;Cryotherapy;Traction;Ultrasound;Gait training;Functional mobility training;Therapeutic activities;Therapeutic exercise;Balance training;Neuromuscular re-education;Patient/family education;Manual techniques;Scar mobilization;Passive range of motion;Dry  needling;Taping;Spinal Manipulations;Joint Manipulations    PT Next Visit Plan Progress HEP and continue education on pain management techniques.    PT Home Exercise Plan [place] and Agree with Plan of Care Patient             Patient will benefit from skilled therapeutic intervention in order to improve the following deficits and impairments:  Abnormal gait, Decreased balance, Decreased endurance, Decreased mobility, Difficulty walking, Hypomobility, Decreased activity tolerance, Decreased coordination, Decreased strength, Decreased range of motion, Improper  body mechanics, Pain, Postural dysfunction  Visit Diagnosis: Cervicalgia  Acute pain of both shoulders  Muscle weakness (generalized)  Abnormal posture     Problem List Patient Active Problem List   Diagnosis Date Noted   Pulmonary embolism ([place]) 07/09/2019   GERD (gastroesophageal reflux disease) 07/09/2019   Migraine 07/09/2019   Urge incontinence 07/09/2019   Insomnia 07/09/2019   Osteoarthritis 07/09/2019   Essential hypertension 05/31/2019   Hypothyroidism 05/31/2019   Hypercholesteremia 05/31/2019   Fibroid uterus 05/31/2019   Diverticulitis 05/31/2019   Glade Cherney, SPT 09/14/21 9:45 AM   [facility] [address] [place], [place], [address] Phone: [phone]   Fax:  [phone]  Name: Andrea Bryan MRN: [id number] Date of Birth: [birth year]

## 2021-09-15 ENCOUNTER — Other Ambulatory Visit: Payer: Self-pay

## 2021-09-15 ENCOUNTER — Ambulatory Visit: Payer: Medicare Other

## 2021-09-15 DIAGNOSIS — R293 Abnormal posture: Secondary | ICD-10-CM

## 2021-09-15 DIAGNOSIS — M6281 Muscle weakness (generalized): Secondary | ICD-10-CM

## 2021-09-15 DIAGNOSIS — M542 Cervicalgia: Secondary | ICD-10-CM | POA: Diagnosis not present

## 2021-09-15 DIAGNOSIS — M25512 Pain in left shoulder: Secondary | ICD-10-CM

## 2021-09-15 NOTE — Therapy (Addendum)
[place], [place], [address] Phone: [phone]   Fax:  [phone] During this treatment session, the therapist was present, participating in and directing the treatment. Gwendolyn Grant, PT, DPT, ATC 09/15/21 1:37 PM  Physical Therapy Treatment  Patient Details  Name: Andrea Bryan MRN: [id number] Date of Birth: 06/21/1966 Referring Provider (PT): Vallarie Mare, MD   Encounter Date: 09/15/2021   PT End of Session - 09/15/21 1147     Visit Number 7    Number of Visits 13    Date for PT Re-Evaluation 09/20/21    Authorization Type UHC MCR    Progress Note Due on Visit 10    PT Start Time 1146    PT Stop Time 1238   10 minutes on moist hot pack   PT Time Calculation (min) 52 min    Activity Tolerance Patient tolerated treatment well    Behavior During Therapy [facility] for tasks assessed/performed;Flat affect             Past Medical History:  Diagnosis Date   Diabetes mellitus without complication ([place])    Hypertension     Past Surgical History:  Procedure Laterality Date   KNEE SURGERY      There were no vitals filed for this visit.   Subjective Assessment - 09/15/21 1148     Subjective Pt says that her neck still hurts, but it did feel better after previous session despite increased pain at the end of session. Pt says that the pain is only on the left side of her neck. Pt says she has f/u with doctor tomorrow.    Pertinent History obesity, hypertension, hyperthyroidism, hx of PE, hx of ACDF on C4-C6    Limitations Standing;Walking    How long can you sit comfortably? unlimited    How long can you stand comfortably? 1 hour    How long can you walk comfortably? 1 hour    Diagnostic tests See MRI and CT results in media file.    Patient Stated Goals "try to manage my pain"    Currently in Pain? Yes    Pain Score 5     Pain Location Neck    Pain Orientation Left    Pain Descriptors /  Indicators Aching    Pain Type Chronic pain    Pain Onset Yesterday    Pain Frequency Constant    Aggravating Factors  unknown    Pain Relieving Factors rest, massage              [facility] Adult PT Treatment/Exercise:   Therapeutic Exercise: - standing scapular depression and retraction with black therabands 2x20 bilat  - standing scapular retractions with 5-10 sec holds 2x15  - attempted scapular retractions prone, pt unable to retract scapula bilat without significant UT involvement.      *not performed today  - T and Y at wall 2x12  - shoulder IR/ER green band bilat 2x15  - horiz abd/add with red band 1x10; discontinued due to inability to perform with proper form - thoracic extension 1x15 at chair  - wall pushups 2x12  - wall circles with towel, 2x10 bilat each direction -shoulder flexion and abduction bilat 3lbs DB, 2x10 each -Updated HEP   Manual - STM and DTM of Lt upper trapezius, trigger point release  - PNF combination of isotonics Lt scapular diagonals for retraction and depression of scapula Lt only.    Self Care - Discussed DN for  trigger point release in Lt upper trapezius. Discussed FOTO score and how it relates to function.   Modalities:  - moist hot pack 10 minutes, seated, cervical pack     [facility] PT Assessment - 09/15/21 0001       Observation/Other Assessments   Focus on Therapeutic Outcomes (FOTO)  58%                                      PT Short Term Goals - 09/08/21 1533       PT SHORT TERM GOAL #1   Title Pt report and demonstrate independance with PT provided initial HEP to improve pain management and overall function.    Status Achieved    Target Date 08/30/21      PT SHORT TERM GOAL #2   Title Pt will be able to demonstrate postural awareness and report independance with managing posture throughout the day to optimize body mechanics to improve sitting and standing function.    Baseline continues to require  postural cues    Status On-going    Target Date 09/06/21      PT SHORT TERM GOAL #3   Title Pt will report painfree AROM into shoulder flexion and abduction to indicate improved soft tissue and neurovascular extensibility to improve pain management.    Baseline 12/8: able to complete strengthening through full range, AROM not formally assessed.    Status Achieved    Target Date 09/06/21               PT Long Term Goals - 08/09/21 1500       PT LONG TERM GOAL #1   Title Pt will achieve FOTO predicted score of 70% to indicate clinically meaningful improvement.    Baseline 58%    Status New    Target Date 09/20/21      PT LONG TERM GOAL #2   Title Pt will demonstrate 4/5 strength on bilateral flexion and abduction to indicate improved innveration of shoulder musculature to improve function and pain.    Baseline see flowsheet    Status New    Target Date 09/06/21      PT LONG TERM GOAL #3   Title Pt will report standing and walking for greater than 1 hour with no pain to improve standing and walking function and indicate improvement in pain management.    Baseline see subjective    Status New    Target Date 09/20/21                   Plan - 09/15/21 1304     Clinical Impression Statement Pt tolerated todays treatment session well with no adverse effects. Pt presents to OPPT today with increased pain in Lt upper trapezius muscles that has been ongoing for the last couple of days without relief from HEP, stretching, or self massage. Pt required heavy verbal and tactile cues to reduce upper trapezius engagement and activating middle and lower trapezius muscles during scapular retractions. Pt reports decreased pain in Lt neck following manual therapy and moist hot pack at the end of session.    Personal Factors and Comorbidities Comorbidity 3+    Comorbidities obesity, hypertension, hyperthyroidism, diabetes    Examination-Activity Limitations Stand;Other   walking    Examination-Participation Restrictions --   None   PT Treatment/Interventions ADLs/Self Care Home Management;Aquatic Therapy;Cryotherapy;Traction;Ultrasound;Gait training;Functional mobility training;Therapeutic activities;Therapeutic exercise;Balance training;Neuromuscular re-education;Patient/family education;Manual  techniques;Scar mobilization;Passive range of motion;Dry needling;Taping;Spinal Manipulations;Joint Manipulations    PT Next Visit Plan Continue manual for pain, assess DN for upper trapezius, and continue addressing postural deficits, UT compensation, and activation of midthoracic musculature.    PT Home Exercise Plan [place] and Agree with Plan of Care Patient             Patient will benefit from skilled therapeutic intervention in order to improve the following deficits and impairments:  Abnormal gait, Decreased balance, Decreased endurance, Decreased mobility, Difficulty walking, Hypomobility, Decreased activity tolerance, Decreased coordination, Decreased strength, Decreased range of motion, Improper body mechanics, Pain, Postural dysfunction  Visit Diagnosis: No diagnosis found.     Problem List Patient Active Problem List   Diagnosis Date Noted   Pulmonary embolism ([place]) 07/09/2019   GERD (gastroesophageal reflux disease) 07/09/2019   Migraine 07/09/2019   Urge incontinence 07/09/2019   Insomnia 07/09/2019   Osteoarthritis 07/09/2019   Essential hypertension 05/31/2019   Hypothyroidism 05/31/2019   Hypercholesteremia 05/31/2019   Fibroid uterus 05/31/2019   Diverticulitis 05/31/2019   Glade Cashin, SPT 09/15/21 1:21 PM   [facility] [address] [place], [place], [address] Phone: [phone]   Fax:  [phone]  Name: Andrea Bryan MRN: [id number] Date of Birth: 1966/04/23

## 2021-09-15 NOTE — Patient Instructions (Signed)

## 2021-09-20 ENCOUNTER — Ambulatory Visit: Payer: Medicare Other

## 2021-09-20 ENCOUNTER — Other Ambulatory Visit: Payer: Self-pay

## 2021-09-20 DIAGNOSIS — M542 Cervicalgia: Secondary | ICD-10-CM | POA: Diagnosis not present

## 2021-09-20 DIAGNOSIS — R293 Abnormal posture: Secondary | ICD-10-CM

## 2021-09-20 DIAGNOSIS — M6281 Muscle weakness (generalized): Secondary | ICD-10-CM

## 2021-09-20 DIAGNOSIS — M25512 Pain in left shoulder: Secondary | ICD-10-CM

## 2021-09-20 NOTE — Therapy (Signed)
[facility] [address] [place], [place], [address] Phone: [phone]   Fax:  [phone]  Physical Therapy Treatment  Patient Details  Name: Andrea Bryan MRN: [id number] Date of Birth: Nov 06, 1965 Referring Provider (PT): Bedelia Person, MD   Encounter Date: 09/20/2021   PT End of Session - 09/20/21 1103     Visit Number 8    Number of Visits 13    Date for PT Re-Evaluation 09/20/21    Authorization Type UHC MCR    Progress Note Due on Visit 10    PT Start Time 1145    PT Stop Time 1225    PT Time Calculation (min) 40 min    Activity Tolerance Patient tolerated treatment well    Behavior During Therapy [facility] for tasks assessed/performed;Flat affect             Past Medical History:  Diagnosis Date   Diabetes mellitus without complication (HCC)    Hypertension     Past Surgical History:  Procedure Laterality Date   KNEE SURGERY      There were no vitals filed for this visit.   Subjective Assessment - 09/20/21 1145     Subjective Patient had f/u with MD last week and says that injections were discussed, but nothing has been scheduled at this time. No reports of pain currently. She is not interested in dry needling at this time.    Currently in Pain? No/denies                [facility] PT Assessment - 09/20/21 0001       AROM   Overall AROM Comments shoulder flexion and abduction AROM WNL and pain free bilaterally    Cervical - Right Rotation 50    Cervical - Left Rotation 46            [facility] Adult PT Treatment/Exercise:   Therapeutic Exercise: - UBE level 1.5 2 min each fwd/bwd   - bilateral shoulder ER red band 2 x 10  - supine horizontal abduction red band 2 x 15  - prone scapular retraction 2 x 15  - prone T single arm 2 x 15 bilateral  - sidelying shoulder ER 2 x 10 bilateral 2#  - resisted shoulder extension red band 2 x 15 - resisted rows blue band 2 x 10  - updated HEP.        *not performed today  - T and Y at wall 2x12  - thoracic extension 1x15 at chair  - wall pushups 2x12  - wall circles with towel, 2x10 bilat each direction -shoulder flexion and abduction bilat 3lbs DB, 2x10 each    Manual N/A   Self Care - N/A   Modalities:  - N/A                          PT Short Term Goals - 09/20/21 1158       PT SHORT TERM GOAL #1   Title Pt report and demonstrate independance with PT provided initial HEP to improve pain management and overall function.    Status Achieved    Target Date 08/30/21      PT SHORT TERM GOAL #2   Title Pt will be able to demonstrate postural awareness and report independance with managing posture throughout the day to optimize body mechanics to improve sitting and standing function.    Baseline continues to require postural cues  Status On-going    Target Date 09/06/21      PT SHORT TERM GOAL #3   Title Pt will report painfree AROM into shoulder flexion and abduction to indicate improved soft tissue and neurovascular extensibility to improve pain management.    Baseline 12/8: able to complete strengthening through full range, AROM not formally assessed.    Status Achieved    Target Date 09/06/21               PT Long Term Goals - 08/09/21 1500       PT LONG TERM GOAL #1   Title Pt will achieve FOTO predicted score of 70% to indicate clinically meaningful improvement.    Baseline 58%    Status New    Target Date 09/20/21      PT LONG TERM GOAL #2   Title Pt will demonstrate 4/5 strength on bilateral flexion and abduction to indicate improved innveration of shoulder musculature to improve function and pain.    Baseline see flowsheet    Status New    Target Date 09/06/21      PT LONG TERM GOAL #3   Title Pt will report standing and walking for greater than 1 hour with no pain to improve standing and walking function and indicate improvement in pain management.    Baseline see  subjective    Status New    Target Date 09/20/21                   Plan - 09/20/21 1159     Clinical Impression Statement Andrea Bryan demonstrates significant improvement in cervical rotation AROM since the start of care and has pain free shoulder flexion and abduction AROM having met this short term goal. Continued to progress periscapular strengthening with patient requiring heavy verbal and tactile cues to decrease excessive upper trap engagement. No reports of pain throughout session.    PT Treatment/Interventions ADLs/Self Care Home Management;Aquatic Therapy;Cryotherapy;Traction;Ultrasound;Gait training;Functional mobility training;Therapeutic activities;Therapeutic exercise;Balance training;Neuromuscular re-education;Patient/family education;Manual techniques;Scar mobilization;Passive range of motion;Dry needling;Taping;Spinal Manipulations;Joint Manipulations    PT Next Visit Plan continue periscapular and deep neck flexor strengthening.    PT Home Exercise Plan [place] and Agree with Plan of Care Patient             Patient will benefit from skilled therapeutic intervention in order to improve the following deficits and impairments:  Abnormal gait, Decreased balance, Decreased endurance, Decreased mobility, Difficulty walking, Hypomobility, Decreased activity tolerance, Decreased coordination, Decreased strength, Decreased range of motion, Improper body mechanics, Pain, Postural dysfunction  Visit Diagnosis: Cervicalgia  Acute pain of both shoulders  Muscle weakness (generalized)  Abnormal posture     Problem List Patient Active Problem List   Diagnosis Date Noted   Pulmonary embolism ([place]) 07/09/2019   GERD (gastroesophageal reflux disease) 07/09/2019   Migraine 07/09/2019   Urge incontinence 07/09/2019   Insomnia 07/09/2019   Osteoarthritis 07/09/2019   Essential hypertension 05/31/2019   Hypothyroidism 05/31/2019   Hypercholesteremia  05/31/2019   Fibroid uterus 05/31/2019   Diverticulitis 05/31/2019   Gwendolyn Grant, PT, DPT, ATC 09/20/21 12:27 PM  [facility] [address] [place], [place], [address] Phone: [phone]   Fax:  [phone]  Name: Andrea Bryan MRN: [id number] Date of Birth: 01-27-1966

## 2021-09-22 ENCOUNTER — Other Ambulatory Visit: Payer: Self-pay

## 2021-09-22 ENCOUNTER — Ambulatory Visit: Payer: Medicare Other

## 2021-09-22 DIAGNOSIS — M6281 Muscle weakness (generalized): Secondary | ICD-10-CM

## 2021-09-22 DIAGNOSIS — M542 Cervicalgia: Secondary | ICD-10-CM

## 2021-09-22 DIAGNOSIS — R293 Abnormal posture: Secondary | ICD-10-CM

## 2021-09-22 DIAGNOSIS — M25511 Pain in right shoulder: Secondary | ICD-10-CM

## 2021-09-22 NOTE — Therapy (Signed)
[place], [place], [address] Phone: [phone]   Fax:  [phone]  Physical Therapy Treatment/Re-Certification/Discharge  Patient Details  Name: Andrea Bryan MRN: [id number] Date of Birth: November 25, 1965 Referring Provider (PT): Vallarie Mare, MD   Encounter Date: 09/22/2021   PT End of Session - 09/22/21 1147     Visit Number 9    Number of Visits 13    Date for PT Re-Evaluation --   N/A discharge   Authorization Type UHC MCR    Progress Note Due on Visit 10    PT Start Time 1147    PT Stop Time 1216    PT Time Calculation (min) 29 min    Activity Tolerance Patient tolerated treatment well    Behavior During Therapy WFL for tasks assessed/performed             Past Medical History:  Diagnosis Date   Diabetes mellitus without complication ([place])    Hypertension     Past Surgical History:  Procedure Laterality Date   KNEE SURGERY      There were no vitals filed for this visit.   Subjective Assessment - 09/22/21 1151     Subjective Patient reports she is doing ok today. She reports her pain is now intermittent in the neck and Lt shoulder. Patient reports subjective overall improvement of 50%, but reports she is not having difficulty with functional tasks/ADLs at this time. She notices that her pain seems to bother her when she is stressed.    How long can you sit comfortably? unlimited    How long can you stand comfortably? difficult, but not due to the neck/shoulder    How long can you walk comfortably? at least an hour    Currently in Pain? No/denies                [facility] PT Assessment - 09/22/21 0001       Observation/Other Assessments   Focus on Therapeutic Outcomes (FOTO)  68%      Posture/Postural Control   Posture Comments able to self-correct      Deep Tendon Reflexes   Biceps DTR 3+   normal LUE; brisk RUE   Brachioradialis DTR 3+   normal LUE; brisk RUE    Triceps DTR 2+   bilateral     AROM   Overall AROM Comments Full and pain free shoulder AROM    Cervical Flexion 41    Cervical Extension 50    Cervical - Right Side Bend 30    Cervical - Left Side Bend 35    Cervical - Right Rotation 55    Cervical - Left Rotation 52      Strength   Right Shoulder Flexion 4+/5    Right Shoulder ABduction 4+/5    Right Shoulder Internal Rotation 5/5    Right Shoulder External Rotation 5/5    Left Shoulder Flexion 4+/5    Left Shoulder ABduction 4+/5    Left Shoulder Internal Rotation 5/5    Left Shoulder External Rotation 5/5    Right Elbow Flexion 5/5    Right Elbow Extension 5/5    Left Elbow Flexion 5/5    Left Elbow Extension 5/5    Right Wrist Flexion 5/5    Right Wrist Extension 5/5    Left Wrist Flexion 5/5    Left Wrist Extension 5/5    Right Hand Grip (lbs) 50    Left Hand Grip (lbs) 55  TREATMENT 09/22/2021:  Therapeutic Exercise: - Reviewed HEP with education on frequency and duration for completion  Therapeutic Activity: - Education on re-assessment findings, progress towards goals, and D/C education                      PT Short Term Goals - 09/22/21 1154       PT SHORT TERM GOAL #1   Title Pt report and demonstrate independance with PT provided initial HEP to improve pain management and overall function.    Status Achieved    Target Date 08/30/21      PT SHORT TERM GOAL #2   Title Pt will be able to demonstrate postural awareness and report independance with managing posture throughout the day to optimize body mechanics to improve sitting and standing function.    Baseline reports correcting her posture as needed during the day. 09/22/21    Status Achieved    Target Date 09/06/21      PT SHORT TERM GOAL #3   Title Pt will report painfree AROM into shoulder flexion and abduction to indicate improved soft tissue and neurovascular extensibility to improve pain management.     Baseline see flowsheet    Status Achieved    Target Date 09/06/21               PT Long Term Goals - 09/22/21 1209       PT LONG TERM GOAL #1   Title Pt will achieve FOTO predicted score of 70% to indicate clinically meaningful improvement.    Baseline 68%    Status --   nearly met   Target Date 09/20/21      PT LONG TERM GOAL #2   Title Pt will demonstrate 4/5 strength on bilateral flexion and abduction to indicate improved innveration of shoulder musculature to improve function and pain.    Baseline see flowsheet    Status Achieved    Target Date 09/06/21      PT LONG TERM GOAL #3   Title Pt will report standing and walking for greater than 1 hour with no pain to improve standing and walking function and indicate improvement in pain management.    Baseline not limited in standing/walking due to her neck/shoulder    Status Achieved    Target Date 09/20/21                   Plan - 09/22/21 1209     Clinical Impression Statement Andrea Bryan has functionally progressed well since the start of care reporting an overall reduction in the frequency, intensity, and duration of her neck and shoulder pain that she notes mostly occurs now due to stress. She demonstrates significant improvements in cervical AROM and BUE strength. She also demonstrates negative Hoffmann's bilaterally and normal BUE reflex testing with exception of Rt biceps and brachioradialis as these remain hyperreflexive. She has met the majority of her established functional goals and demonstrates independence with advanced home program. She is therefore appropriate for D/C at this time with patient in agreemeent with this plan.    PT Treatment/Interventions ADLs/Self Care Home Management;Aquatic Therapy;Cryotherapy;Traction;Ultrasound;Gait training;Functional mobility training;Therapeutic activities;Therapeutic exercise;Balance training;Neuromuscular re-education;Patient/family education;Manual techniques;Scar  mobilization;Passive range of motion;Dry needling;Taping;Spinal Manipulations;Joint Manipulations    PT Next Visit Plan --    [place] and Agree with Plan of Care Patient             Patient will benefit from skilled therapeutic  intervention in order to improve the following deficits and impairments:  Abnormal gait, Decreased balance, Decreased endurance, Decreased mobility, Difficulty walking, Hypomobility, Decreased activity tolerance, Decreased coordination, Decreased strength, Decreased range of motion, Improper body mechanics, Pain, Postural dysfunction  Visit Diagnosis: Cervicalgia - Plan: PT plan of care cert/re-cert  Acute pain of both shoulders - Plan: PT plan of care cert/re-cert  Muscle weakness (generalized) - Plan: PT plan of care cert/re-cert  Abnormal posture - Plan: PT plan of care cert/re-cert     Problem List Patient Active Problem List   Diagnosis Date Noted   Pulmonary embolism ([place]) 07/09/2019   GERD (gastroesophageal reflux disease) 07/09/2019   Migraine 07/09/2019   Urge incontinence 07/09/2019   Insomnia 07/09/2019   Osteoarthritis 07/09/2019   Essential hypertension 05/31/2019   Hypothyroidism 05/31/2019   Hypercholesteremia 05/31/2019   Fibroid uterus 05/31/2019   Diverticulitis 05/31/2019   PHYSICAL THERAPY DISCHARGE SUMMARY  Visits from Start of Care: 9  Current functional level related to goals / functional outcomes: See goals above   Remaining deficits: See impression above   Education / Equipment: See treatment above   Patient agrees to discharge. Patient goals were nearly met. Patient is being discharged due to maximized rehab potential.   Gwendolyn Grant, PT, DPT, ATC 09/22/21 2:21 PM  [facility] [address] [place], [place], [address] Phone: [phone]   Fax:  [phone]  Name: Andrea Bryan MRN: [id number] Date of Birth:  1966/05/05

## 2021-09-27 ENCOUNTER — Other Ambulatory Visit: Payer: Self-pay | Admitting: Podiatry

## 2021-09-27 NOTE — Telephone Encounter (Signed)
Please advise 

## 2021-09-28 NOTE — Telephone Encounter (Signed)
She will need to be evaluated. Please schedule her

## 2021-10-20 ENCOUNTER — Inpatient Hospital Stay: Admission: RE | Admit: 2021-10-20 | Payer: Medicare Other | Source: Ambulatory Visit

## 2021-11-01 ENCOUNTER — Other Ambulatory Visit: Payer: Medicare Other

## 2021-11-01 ENCOUNTER — Other Ambulatory Visit: Payer: Self-pay | Admitting: Family Medicine

## 2021-11-01 DIAGNOSIS — E2839 Other primary ovarian failure: Secondary | ICD-10-CM

## 2021-11-24 ENCOUNTER — Other Ambulatory Visit: Payer: Medicare Other

## 2022-03-24 ENCOUNTER — Ambulatory Visit
Admission: RE | Admit: 2022-03-24 | Discharge: 2022-03-24 | Disposition: A | Discharge status: Home / Self Care | Payer: Medicare Other | Source: Ambulatory Visit | Attending: Family Medicine | Admitting: Family Medicine

## 2022-03-24 DIAGNOSIS — E2839 Other primary ovarian failure: Secondary | ICD-10-CM

## 2022-06-12 ENCOUNTER — Other Ambulatory Visit: Payer: Self-pay | Admitting: Adult Medicine

## 2022-06-12 DIAGNOSIS — Z1231 Encounter for screening mammogram for malignant neoplasm of breast: Secondary | ICD-10-CM

## 2022-07-11 ENCOUNTER — Ambulatory Visit: Payer: Medicare Other

## 2022-08-17 ENCOUNTER — Ambulatory Visit: Payer: Medicare Other

## 2022-08-22 ENCOUNTER — Emergency Department (HOSPITAL_COMMUNITY): Payer: Medicare Other

## 2022-08-22 ENCOUNTER — Encounter (HOSPITAL_COMMUNITY): Payer: Self-pay

## 2022-08-22 ENCOUNTER — Emergency Department (HOSPITAL_COMMUNITY)
Admission: EM | Admit: 2022-08-22 | Discharge: 2022-08-23 | Disposition: A | Discharge status: Home / Self Care | Payer: Medicare Other | Attending: Emergency Medicine | Admitting: Emergency Medicine

## 2022-08-22 ENCOUNTER — Other Ambulatory Visit: Payer: Self-pay

## 2022-08-22 DIAGNOSIS — Z79899 Other long term (current) drug therapy: Secondary | ICD-10-CM | POA: Insufficient documentation

## 2022-08-22 DIAGNOSIS — Z7984 Long term (current) use of oral hypoglycemic drugs: Secondary | ICD-10-CM | POA: Insufficient documentation

## 2022-08-22 DIAGNOSIS — E119 Type 2 diabetes mellitus without complications: Secondary | ICD-10-CM | POA: Diagnosis not present

## 2022-08-22 DIAGNOSIS — Z7901 Long term (current) use of anticoagulants: Secondary | ICD-10-CM | POA: Diagnosis not present

## 2022-08-22 DIAGNOSIS — M7661 Achilles tendinitis, right leg: Secondary | ICD-10-CM | POA: Insufficient documentation

## 2022-08-22 DIAGNOSIS — I10 Essential (primary) hypertension: Secondary | ICD-10-CM | POA: Diagnosis not present

## 2022-08-22 DIAGNOSIS — M79604 Pain in right leg: Secondary | ICD-10-CM | POA: Diagnosis present

## 2022-08-22 LAB — D-DIMER, QUANTITATIVE: D-Dimer, Quant: 0.44 ug/mL-FEU (ref 0.00–0.50)

## 2022-08-22 NOTE — ED Triage Notes (Signed)
Ems brings pt in for right leg pain. Denies injury.

## 2022-08-22 NOTE — ED Provider Triage Note (Signed)
Emergency Medicine Provider Triage Evaluation Note  Andrea Bryan , a 56 y.o. female  was evaluated in triage.  Pt complains of right lower leg and ankle pain x 2-3 weeks. No known injury. Hx of blood clots and on eliquis, no missed doses.   Review of Systems  Positive: Leg pain/swelling Negative: CP, SOB  Physical Exam  Ht '5\' 6"'$  (1.676 m)   Wt 113.4 kg   SpO2 98%   BMI 40.35 kg/m  Gen:   Awake, no distress   Resp:  Normal effort  MSK:   Moves extremities without difficulty  Other:  Right ankle swelling and tenderness, no bony deformity, no calf tenderness  Medical Decision Making  Medically screening exam initiated at 8:29 PM.  Appropriate orders placed.  LYNIAH FUJITA was informed that the remainder of the evaluation will be completed by another provider, this initial triage assessment does not replace that evaluation, and the importance of remaining in the ED until their evaluation is complete.  Workup initiated   Miranda Frese T, PA-C 08/22/22 2030

## 2022-08-22 NOTE — ED Triage Notes (Signed)
Arrives EMS from home with c/o right leg pain ~ 1 month.   Subjective swelling and worse when walking.

## 2022-08-23 MED ORDER — IBUPROFEN 600 MG PO TABS: 600.0000 mg | ORAL_TABLET | Freq: Three times a day (TID) | ORAL | 0 refills | Status: AC

## 2022-08-23 NOTE — ED Notes (Signed)
Pt given bus pass ?

## 2022-08-23 NOTE — ED Provider Notes (Signed)
[place] DEPT Provider Note   CSN: [id number] Arrival date & time: 08/22/22  1839     History  Chief Complaint  Patient presents with   Leg Pain    Andrea Bryan is a 56 y.o. female with past medical history significant for previous PE, obesity, diabetes, hypertension who presents with concern for right leg, ankle pain for around 1 month.  Patient reports some subjective swelling, worse with walking.  She denies any known injury.  She reports no previous injuries to the affected ankle.  She is wearing a compressive sock to help with the swelling and pain.  She denies any numbness, tingling.  She reports she can bear weight with some difficulty.   Leg Pain      Home Medications Prior to Admission medications   Medication Sig Start Date End Date Taking? Authorizing Provider  ibuprofen (ADVIL) 600 MG tablet Take 1 tablet (600 mg total) by mouth 3 (three) times daily. 08/23/22  Yes Shakeyla Giebler H, PA-C  acetaminophen (TYLENOL) 500 MG tablet Take 1,000 mg by mouth every 6 (six) hours as needed for mild pain.     [provider]  atenolol (TENORMIN) 100 MG tablet Take 100 mg by mouth daily. 09/19/16   [provider]  clotrimazole (LOTRIMIN) 1 % cream Apply to affected (gluteal area) area 2 times daily Patient not taking: Reported on 08/09/2021 05/26/21   Sponseller, Gypsy Balsam, PA-C  cyclobenzaprine (FLEXERIL) 10 MG tablet Take 10 mg by mouth in the morning and at bedtime.    [provider]  diclofenac (VOLTAREN) 75 MG EC tablet TAKE 1 TABLET(75 MG) BY MOUTH TWICE DAILY 06/27/21   Criselda Peaches, DPM  Diclofenac Sodium 1.6 % GEL Place 2 g onto the skin every 6 (six) hours as needed (Pain). Patient not taking: No sig reported 07/05/19   McDonald, Mia A, PA-C  dicyclomine (BENTYL) 20 MG tablet Take 1 tablet (20 mg total) by mouth every 12 (twelve) hours as needed (for abdominal pain/cramping). Patient not taking: Reported  on 08/09/2021 01/07/21   Antonietta Breach, PA-C  doxycycline (VIBRA-TABS) 100 MG tablet Take 1 tablet (100 mg total) by mouth 2 (two) times daily. Patient not taking: Reported on 01/07/2021 12/12/20   Wallene Huh, DPM  ELIQUIS 5 MG TABS tablet Take 5 mg by mouth 2 (two) times daily. 03/03/19   [provider]  FEROSUL 325 (65 Fe) MG tablet Take 325 mg by mouth daily with breakfast.  05/08/19   [provider]  fluticasone (FLONASE) 50 MCG/ACT nasal spray Place 1 spray into both nostrils daily as needed for allergies.  09/19/16   [provider]  JANUVIA 100 MG tablet Take 100 mg by mouth daily. 06/03/19   [provider]  Levothyroxine Sodium 112 MCG CAPS Take 112 mcg by mouth daily before breakfast. 09/19/16   [provider]  lidocaine (LIDODERM) 5 % Place 1 patch onto the skin daily. Remove & Discard patch within 12 hours or as directed by MD Patient not taking: No sig reported 07/05/19   McDonald, Mia A, PA-C  lisinopril (ZESTRIL) 20 MG tablet Take 20 mg by mouth daily. 09/19/16   [provider]  loratadine (CLARITIN) 10 MG tablet Take 10 mg by mouth daily.    [provider]  omeprazole (PRILOSEC) 20 MG capsule Take 20 mg by mouth daily. 02/19/18   [provider]  oxybutynin (DITROPAN-XL) 10 MG 24 hr tablet Take 10 mg by  mouth at bedtime.  05/08/19   [provider]  polyethylene glycol (MIRALAX / GLYCOLAX) 17 g packet Take 17 g by mouth daily. Patient not taking: No sig reported 10/12/20   Deno Etienne, DO  risperidone (RISPERDAL) 4 MG tablet Take 4 mg by mouth at bedtime. Patient not taking: No sig reported 06/03/19   [provider]  simvastatin (ZOCOR) 20 MG tablet Take 20 mg by mouth daily. 09/19/16   [provider]  SUMAtriptan (IMITREX) 50 MG tablet Take 50 mg by mouth See admin instructions. Take 1 tab at onset of migraine may repeat in 2 hours no more than 4 in 2 hours. 06/03/19   [provider]       Allergies    Metformin, Atorvastatin, Oxycodone, and Tramadol    Review of Systems   Review of Systems  Musculoskeletal:  Positive for arthralgias.  All other systems reviewed and are negative.   Physical Exam Updated Vital Signs BP (!) 141/76   Pulse 65   Resp 18   Ht '5\' 6"'$  (1.676 m)   Wt 113.4 kg   SpO2 100%   BMI 40.35 kg/m  Physical Exam Vitals and nursing note reviewed.  Constitutional:      General: She is not in acute distress.    Appearance: Normal appearance.  HENT:     Head: Normocephalic and atraumatic.  Eyes:     General:        Right eye: No discharge.        Left eye: No discharge.  Cardiovascular:     Rate and Rhythm: Normal rate and regular rhythm.     Pulses: Normal pulses.  Pulmonary:     Effort: Pulmonary effort is normal. No respiratory distress.  Musculoskeletal:        General: No deformity.     Comments: Patient with significant tenderness along the Achilles tendon without significant redness or swelling.  She has significant decreased range of motion passively to dorsiflexion, plantarflexion of the right ankle.  However actively I am able to range the joint through normal degrees of motion.  No step-off, deformity, no anterior ankle, or lateral ankle pain noted.  Tenderness extends towards the right calf.  Skin:    General: Skin is warm and dry.     Capillary Refill: Capillary refill takes less than 2 seconds.  Neurological:     Mental Status: She is alert and oriented to person, place, and time.  Psychiatric:        Mood and Affect: Mood normal.        Behavior: Behavior normal.     ED Results / Procedures / Treatments   Labs (all labs ordered are listed, but only abnormal results are displayed) Labs Reviewed  D-DIMER, QUANTITATIVE    EKG None  Radiology DG Ankle Complete Right  Result Date: 08/22/2022 CLINICAL DATA:  Lower leg pain, swelling EXAM: RIGHT ANKLE - COMPLETE 3+ VIEW COMPARISON:  None Available. FINDINGS: Well  corticated bone fragment adjacent to the medial malleolus could reflect secondary ossification center or old avulsion fracture. No acute fracture, subluxation or dislocation. Joint spaces maintained. Mild diffuse soft tissue swelling. IMPRESSION: No acute bony abnormality. Electronically Signed   By: Rolm Baptise M.D.   On: 08/22/2022 21:34    Procedures Procedures    Medications Ordered in ED Medications - No data to display  ED Course/ Medical Decision Making/ A&P  Medical Decision Making  This is an overall well-appearing 56 year old female who presents with concern for right ankle pain for the last month, worsened over the last few days, and difficulty walking.  Patient denies any known injury.  My emergent differential diagnosis includes acute DVT, Achilles tendon rupture, fracture, dislocation, gout flare, versus other abnormality.  On exam patient with significant tenderness over the Achilles tendon, tightness of the calf, decreased passive range of motion.  Discussed with patient that she has some ankle dysfunction related to her lack of range of motion, inflammation of the Achilles tendon and calf versus soleus.  No acute abnormality noted.  I independently interpreted imaging including plain film radiograph of the right ankle which shows no acute fracture, dislocation, or other abnormality. I agree with the radiologist interpretation.  Independently interpreted D-dimer which is normal at this time, she is not having any shortness of breath, chest pain to suggest any occult D-dimer negative PE, low clinical suspicion at this time.  Her symptoms do seem consistent with Achilles tendinitis, calf, soleus dysfunction.  Encouraged ibuprofen, Tylenol, rest, ice, compression, elevation, will provide ASO brace, encouraged orthopedic follow-up.  Final Clinical Impression(s) / ED Diagnoses Final diagnoses:  Achilles tendinitis of right lower extremity    Rx / DC  Orders ED Discharge Orders          Ordered    ibuprofen (ADVIL) 600 MG tablet  3 times daily        08/23/22 0106              Akif Weldy, Joesph Fillers, PA-C 08/23/22 0113    Fatima Blank, MD 08/23/22 [phone]

## 2022-08-23 NOTE — Discharge Instructions (Signed)
Please use Tylenol or ibuprofen for pain.  You may use 600 mg ibuprofen every 6 hours or 1000 mg of Tylenol every 6 hours.  You may choose to alternate between the 2.  This would be most effective.  Not to exceed 4 g of Tylenol within 24 hours.  Not to exceed 3200 mg ibuprofen 24 hours.  Recommend rest, ice, compression, elevation defect extremity, you can ice it for 15 minutes at a time twice an hour with release to 10 to 15-minute break between icing.  Please follow-up with orthopedics at your earliest convenience for further evaluation and management, but in the meantime please begin doing the rehab exercises I provided above.

## 2022-09-21 ENCOUNTER — Other Ambulatory Visit: Payer: Self-pay | Admitting: Adult Medicine

## 2022-09-21 DIAGNOSIS — M8589 Other specified disorders of bone density and structure, multiple sites: Secondary | ICD-10-CM

## 2022-09-22 ENCOUNTER — Other Ambulatory Visit: Payer: Self-pay | Admitting: Orthopedic Surgery

## 2022-09-22 DIAGNOSIS — M7989 Other specified soft tissue disorders: Secondary | ICD-10-CM

## 2022-10-11 ENCOUNTER — Inpatient Hospital Stay: Admission: RE | Admit: 2022-10-11 | Payer: Medicare Other | Source: Ambulatory Visit

## 2022-10-11 ENCOUNTER — Other Ambulatory Visit (HOSPITAL_COMMUNITY): Payer: Self-pay | Admitting: Orthopedic Surgery

## 2022-10-11 DIAGNOSIS — R2241 Localized swelling, mass and lump, right lower limb: Secondary | ICD-10-CM

## 2022-10-11 DIAGNOSIS — M84371A Stress fracture, right ankle, initial encounter for fracture: Secondary | ICD-10-CM

## 2022-10-18 ENCOUNTER — Encounter (HOSPITAL_COMMUNITY): Payer: Self-pay

## 2022-10-18 ENCOUNTER — Ambulatory Visit (HOSPITAL_COMMUNITY): Admission: RE | Admit: 2022-10-18 | Payer: 59 | Source: Ambulatory Visit

## 2022-10-19 ENCOUNTER — Ambulatory Visit
Admission: RE | Admit: 2022-10-19 | Discharge: 2022-10-19 | Disposition: A | Discharge status: Home / Self Care | Payer: 59 | Source: Ambulatory Visit | Attending: Adult Medicine | Admitting: Adult Medicine

## 2022-10-19 DIAGNOSIS — Z1231 Encounter for screening mammogram for malignant neoplasm of breast: Secondary | ICD-10-CM

## 2022-10-27 ENCOUNTER — Encounter (HOSPITAL_COMMUNITY): Payer: Self-pay

## 2022-10-27 ENCOUNTER — Ambulatory Visit (HOSPITAL_COMMUNITY)
Admission: RE | Admit: 2022-10-27 | Discharge: 2022-10-27 | Disposition: A | Discharge status: Home / Self Care | Payer: 59 | Source: Ambulatory Visit | Attending: Orthopedic Surgery | Admitting: Orthopedic Surgery

## 2022-10-27 DIAGNOSIS — M7989 Other specified soft tissue disorders: Secondary | ICD-10-CM | POA: Diagnosis present

## 2022-10-27 MED ORDER — IOHEXOL 300 MG/ML  SOLN
100.0000 mL | Freq: Once | INTRAMUSCULAR | Status: AC | PRN
Start: 2022-10-27 — End: 2022-10-27
  Administered 2022-10-27: 100 mL via INTRAVENOUS
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
## 2023-10-18 ENCOUNTER — Ambulatory Visit (INDEPENDENT_AMBULATORY_CARE_PROVIDER_SITE_OTHER): Payer: 59 | Admitting: Licensed Clinical Social Worker

## 2023-10-18 DIAGNOSIS — Z91199 Patient's noncompliance with other medical treatment and regimen due to unspecified reason: Secondary | ICD-10-CM

## 2023-10-18 NOTE — Progress Notes (Signed)
THERAPIST PROGRESS NOTE   Session Date: 10/18/2023  Session Time: 1300  Patient no-showed today's appointment; appointment was for intake appointment to establish therapy services.   Leisa Lenz, MSW, LCSW 10/18/2023,  1:15 PM

## 2024-01-02 ENCOUNTER — Other Ambulatory Visit: Payer: Self-pay | Admitting: Adult Medicine

## 2024-01-02 DIAGNOSIS — Z1231 Encounter for screening mammogram for malignant neoplasm of breast: Secondary | ICD-10-CM

## 2024-01-09 ENCOUNTER — Ambulatory Visit
Admission: RE | Admit: 2024-01-09 | Discharge: 2024-01-09 | Disposition: A | Discharge status: Home / Self Care | Source: Ambulatory Visit | Attending: Adult Medicine | Admitting: Adult Medicine

## 2024-01-09 DIAGNOSIS — Z1231 Encounter for screening mammogram for malignant neoplasm of breast: Secondary | ICD-10-CM

## 2024-01-16 ENCOUNTER — Ambulatory Visit (HOSPITAL_COMMUNITY): Admitting: Family

## 2024-01-29 ENCOUNTER — Ambulatory Visit (HOSPITAL_COMMUNITY): Admitting: Family

## 2024-02-18 ENCOUNTER — Ambulatory Visit (HOSPITAL_COMMUNITY): Admitting: Family

## 2024-02-19 ENCOUNTER — Encounter: Admitting: Obstetrics and Gynecology

## 2024-03-04 ENCOUNTER — Ambulatory Visit (HOSPITAL_COMMUNITY): Admitting: Family
